# Patient Record
Sex: Male | Born: 1945 | Race: Black or African American | Hispanic: No | Marital: Single | Smoking: Former smoker
Health system: Southern US, Community
[De-identification: ages and names within clinical notes are randomized; demographics above are authoritative.]

## PROBLEM LIST (undated history)

## (undated) DIAGNOSIS — F819 Developmental disorder of scholastic skills, unspecified: Secondary | ICD-10-CM

## (undated) DIAGNOSIS — G629 Polyneuropathy, unspecified: Secondary | ICD-10-CM

## (undated) DIAGNOSIS — C100 Malignant neoplasm of vallecula: Secondary | ICD-10-CM

## (undated) DIAGNOSIS — R634 Abnormal weight loss: Secondary | ICD-10-CM

## (undated) DIAGNOSIS — Z9221 Personal history of antineoplastic chemotherapy: Secondary | ICD-10-CM

## (undated) DIAGNOSIS — R05 Cough: Secondary | ICD-10-CM

## (undated) DIAGNOSIS — E871 Hypo-osmolality and hyponatremia: Secondary | ICD-10-CM

## (undated) DIAGNOSIS — A150 Tuberculosis of lung: Secondary | ICD-10-CM

## (undated) DIAGNOSIS — R112 Nausea with vomiting, unspecified: Secondary | ICD-10-CM

## (undated) DIAGNOSIS — R1310 Dysphagia, unspecified: Secondary | ICD-10-CM

## (undated) DIAGNOSIS — R300 Dysuria: Secondary | ICD-10-CM

## (undated) DIAGNOSIS — E079 Disorder of thyroid, unspecified: Secondary | ICD-10-CM

## (undated) DIAGNOSIS — R0602 Shortness of breath: Secondary | ICD-10-CM

## (undated) DIAGNOSIS — M1990 Unspecified osteoarthritis, unspecified site: Secondary | ICD-10-CM

## (undated) DIAGNOSIS — H9209 Otalgia, unspecified ear: Secondary | ICD-10-CM

## (undated) DIAGNOSIS — G620 Drug-induced polyneuropathy: Secondary | ICD-10-CM

## (undated) DIAGNOSIS — C799 Secondary malignant neoplasm of unspecified site: Secondary | ICD-10-CM

## (undated) DIAGNOSIS — Z923 Personal history of irradiation: Secondary | ICD-10-CM

## (undated) DIAGNOSIS — C801 Malignant (primary) neoplasm, unspecified: Secondary | ICD-10-CM

## (undated) DIAGNOSIS — T451X5A Adverse effect of antineoplastic and immunosuppressive drugs, initial encounter: Secondary | ICD-10-CM

## (undated) DIAGNOSIS — R059 Cough, unspecified: Secondary | ICD-10-CM

## (undated) HISTORY — DX: Abnormal weight loss: R63.4

## (undated) HISTORY — DX: Disorder of thyroid, unspecified: E07.9

## (undated) HISTORY — DX: Polyneuropathy, unspecified: G62.9

## (undated) HISTORY — DX: Nausea with vomiting, unspecified: R11.2

## (undated) HISTORY — DX: Secondary malignant neoplasm of unspecified site: C79.9

## (undated) HISTORY — DX: Dysuria: R30.0

## (undated) HISTORY — DX: Hypo-osmolality and hyponatremia: E87.1

---

## 1996-06-09 DIAGNOSIS — A150 Tuberculosis of lung: Secondary | ICD-10-CM

## 1996-06-09 HISTORY — DX: Tuberculosis of lung: A15.0

## 1999-01-23 ENCOUNTER — Encounter: Payer: Self-pay | Admitting: Cardiology

## 1999-01-23 ENCOUNTER — Ambulatory Visit (HOSPITAL_COMMUNITY): Admission: RE | Admit: 1999-01-23 | Discharge: 1999-01-23 | Payer: Self-pay | Admitting: Cardiology

## 2004-06-09 HISTORY — PX: COLONOSCOPY: SHX174

## 2004-10-23 ENCOUNTER — Encounter: Admission: RE | Admit: 2004-10-23 | Discharge: 2004-10-23 | Payer: Self-pay | Admitting: Internal Medicine

## 2005-09-17 ENCOUNTER — Encounter: Admission: RE | Admit: 2005-09-17 | Discharge: 2005-09-17 | Payer: Self-pay | Admitting: Internal Medicine

## 2009-05-15 ENCOUNTER — Encounter: Admission: RE | Admit: 2009-05-15 | Discharge: 2009-05-15 | Payer: Self-pay | Admitting: Internal Medicine

## 2010-06-30 ENCOUNTER — Encounter: Payer: Self-pay | Admitting: Internal Medicine

## 2010-08-07 ENCOUNTER — Other Ambulatory Visit: Payer: Self-pay | Admitting: Internal Medicine

## 2010-08-07 ENCOUNTER — Ambulatory Visit
Admission: RE | Admit: 2010-08-07 | Discharge: 2010-08-07 | Disposition: A | Discharge status: Home / Self Care | Payer: Medicare Other | Source: Ambulatory Visit | Attending: Internal Medicine | Admitting: Internal Medicine

## 2010-08-07 DIAGNOSIS — R7611 Nonspecific reaction to tuberculin skin test without active tuberculosis: Secondary | ICD-10-CM

## 2011-07-09 HISTORY — PX: DIRECT LARYNGOSCOPY: SHX5326

## 2012-07-03 ENCOUNTER — Emergency Department (INDEPENDENT_AMBULATORY_CARE_PROVIDER_SITE_OTHER)
Admission: EM | Admit: 2012-07-03 | Discharge: 2012-07-03 | Disposition: A | Discharge status: Nursing Home (Includes ICF) | Payer: Medicare Other | Source: Home / Self Care | Attending: Family Medicine | Admitting: Family Medicine

## 2012-07-03 ENCOUNTER — Emergency Department (HOSPITAL_COMMUNITY)
Admission: EM | Admit: 2012-07-03 | Discharge: 2012-07-03 | Disposition: A | Discharge status: Home / Self Care | Payer: Medicare Other | Attending: Emergency Medicine | Admitting: Emergency Medicine

## 2012-07-03 ENCOUNTER — Encounter (HOSPITAL_COMMUNITY): Payer: Self-pay | Admitting: *Deleted

## 2012-07-03 ENCOUNTER — Emergency Department (HOSPITAL_COMMUNITY): Payer: Medicare Other

## 2012-07-03 ENCOUNTER — Encounter (HOSPITAL_COMMUNITY): Payer: Self-pay | Admitting: Physical Medicine and Rehabilitation

## 2012-07-03 DIAGNOSIS — R634 Abnormal weight loss: Secondary | ICD-10-CM

## 2012-07-03 DIAGNOSIS — R1310 Dysphagia, unspecified: Secondary | ICD-10-CM | POA: Insufficient documentation

## 2012-07-03 DIAGNOSIS — F17200 Nicotine dependence, unspecified, uncomplicated: Secondary | ICD-10-CM | POA: Insufficient documentation

## 2012-07-03 DIAGNOSIS — Z8611 Personal history of tuberculosis: Secondary | ICD-10-CM | POA: Insufficient documentation

## 2012-07-03 DIAGNOSIS — J3489 Other specified disorders of nose and nasal sinuses: Secondary | ICD-10-CM | POA: Insufficient documentation

## 2012-07-03 DIAGNOSIS — R1110 Vomiting, unspecified: Secondary | ICD-10-CM

## 2012-07-03 DIAGNOSIS — J029 Acute pharyngitis, unspecified: Secondary | ICD-10-CM | POA: Insufficient documentation

## 2012-07-03 DIAGNOSIS — C760 Malignant neoplasm of head, face and neck: Secondary | ICD-10-CM | POA: Insufficient documentation

## 2012-07-03 DIAGNOSIS — R040 Epistaxis: Secondary | ICD-10-CM | POA: Insufficient documentation

## 2012-07-03 HISTORY — DX: Tuberculosis of lung: A15.0

## 2012-07-03 LAB — BASIC METABOLIC PANEL
CO2: 28 mEq/L (ref 19–32)
Calcium: 9.1 mg/dL (ref 8.4–10.5)
Chloride: 99 mEq/L (ref 96–112)
Potassium: 3.7 mEq/L (ref 3.5–5.1)
Sodium: 135 mEq/L (ref 135–145)

## 2012-07-03 LAB — CBC WITH DIFFERENTIAL/PLATELET
Basophils Absolute: 0 10*3/uL (ref 0.0–0.1)
Lymphocytes Relative: 26 % (ref 12–46)
Neutro Abs: 2.9 10*3/uL (ref 1.7–7.7)
Neutrophils Relative %: 59 % (ref 43–77)
Platelets: 172 10*3/uL (ref 150–400)
RDW: 14.1 % (ref 11.5–15.5)
WBC: 5 10*3/uL (ref 4.0–10.5)

## 2012-07-03 MED ORDER — IOHEXOL 300 MG/ML  SOLN
75.0000 mL | Freq: Once | INTRAMUSCULAR | Status: AC | PRN
  Administered 2012-07-03: 75 mL via INTRAVENOUS

## 2012-07-03 NOTE — ED Notes (Signed)
Report called to Jennifer, ED Nurse First. 

## 2012-07-03 NOTE — ED Notes (Signed)
Sore throat, right earache, runny nose, productive cough x 3 wks.  Family member reports 15 lb weight loss over past 3 months.  Denies fevers.

## 2012-07-03 NOTE — ED Provider Notes (Signed)
History     CSN: [id number]  Arrival date & time 07/03/12  1611   First MD Initiated Contact with Patient 07/03/12 1702      No chief complaint on file.   (Consider location/radiation/quality/duration/timing/severity/associated sxs/prior treatment) Patient is a 67 y.o. male presenting with cough. The history is provided by the patient. No language interpreter was used.  Cough This is a new problem. The problem occurs constantly. The problem has been gradually worsening. The cough is productive of sputum. There has been no fever. Associated symptoms include sore throat. He is a smoker.  Pt not able to keep food down,  Pain in throat,  Mass in neck,  Cough and trouble breathing.  Pt has had 15 pd weight loss in 2 months  No past medical history on file.  No past surgical history on file.  No family history on file.  History  Substance Use Topics  . Smoking status: Not on file  . Smokeless tobacco: Not on file  . Alcohol Use: Not on file      Review of Systems  HENT: Positive for sore throat.   Respiratory: Positive for cough.   All other systems reviewed and are negative.    Allergies  Review of patient's allergies indicates not on file.  Home Medications  No current outpatient prescriptions on file.  BP 141/69  Pulse 63  Temp 97.6 F (36.4 C) (Oral)  Resp 16  SpO2 100%  Physical Exam  Nursing note and vitals reviewed. Constitutional: He appears well-developed and well-nourished.  HENT:  Head: Normocephalic and atraumatic.       erythema  Eyes: Conjunctivae normal and EOM are normal. Pupils are equal, round, and reactive to light.  Neck:       2x3 cm mass right lateral neck  Cardiovascular: Normal rate.   Pulmonary/Chest:       Harsh rhochi   Musculoskeletal: Normal range of motion.  Neurological: He is alert.  Skin: Skin is warm.  Psychiatric: He has a normal mood and affect.    ED Course  Procedures (including critical care time)  Labs  Reviewed - No data to display No results found.   No diagnosis found.    MDM  I counseled pt and daughter.   Pt needs labs, chest xray and evaluation of neck mass due to difficulty eating, drinking and vomiting.          Lonia Skinner [place], [place] 07/03/12 [phone]

## 2012-07-03 NOTE — ED Notes (Signed)
Patient states he has had difficulty swallowing for the past few weeks. He has a mass to the right side of his neck. He has been vomiting after eating.

## 2012-07-03 NOTE — ED Provider Notes (Signed)
History     CSN: [id number]  Arrival date & time 07/03/12  7829   First MD Initiated Contact with Patient 07/03/12 1847      Chief Complaint  Patient presents with  . Neck Pain  . Sore Throat    (Consider location/radiation/quality/duration/timing/severity/associated sxs/prior treatment) HPI Comments: Pt comes in with cc of sore throat and neck pain. Pt has had this pain for 3 weeks now. States that the pain is worse with swallowing, and is located on the right side. He also has a sensation that the food is getting stuck. He has some URI like symptoms. Family and patient also endorse decreased appetite and weight loss of about 15 lbs. Pt is not a heavy smoker, and he has no medical problems at all, and no hx of cancers.  Patient is a 67 y.o. male presenting with neck pain and pharyngitis. The history is provided by the patient.  Neck Pain  Pertinent negatives include no chest pain and no headaches.  Sore Throat Pertinent negatives include no chest pain, no headaches and no shortness of breath.    Past Medical History  Diagnosis Date  . TB (pulmonary tuberculosis)     History reviewed. No pertinent past surgical history.  History reviewed. No pertinent family history.  History  Substance Use Topics  . Smoking status: Current Every Day Smoker -- 0.2 packs/day    Types: Cigarettes  . Smokeless tobacco: Not on file  . Alcohol Use: Yes     Comment: occasional      Review of Systems  Constitutional: Negative for fever, chills and activity change.  HENT: Positive for nosebleeds, congestion, trouble swallowing and neck pain.   Eyes: Negative for visual disturbance.  Respiratory: Negative for cough, chest tightness and shortness of breath.   Cardiovascular: Negative for chest pain.  Gastrointestinal: Negative for abdominal distention.  Genitourinary: Negative for dysuria, enuresis and difficulty urinating.  Musculoskeletal: Negative for arthralgias.  Neurological:  Negative for dizziness, light-headedness and headaches.  Hematological: Negative for adenopathy.  Psychiatric/Behavioral: Negative for confusion.    Allergies  Review of patient's allergies indicates no known allergies.  Home Medications   Current Outpatient Rx  Name  Route  Sig  Dispense  Refill  . NAPROXEN SODIUM 220 MG PO TABS   Oral   Take 440 mg by mouth daily as needed. For pain           BP 142/87  Pulse 68  Temp 97.6 F (36.4 C) (Oral)  Resp 16  SpO2 98%  Physical Exam  Nursing note and vitals reviewed. Constitutional: He is oriented to person, place, and time. He appears well-developed.  HENT:  Head: Normocephalic and atraumatic.       Right submandibular mass, hard, tender to palpation, appears fix  Eyes: Conjunctivae normal and EOM are normal. Pupils are equal, round, and reactive to light.  Neck: Normal range of motion. Neck supple. No tracheal deviation present.  Cardiovascular: Normal rate, regular rhythm and normal heart sounds.   Pulmonary/Chest: Effort normal and breath sounds normal. No respiratory distress. He has no wheezes.  Abdominal: Soft. Bowel sounds are normal. He exhibits no distension. There is no tenderness. There is no rebound and no guarding.  Lymphadenopathy:    He has cervical adenopathy.  Neurological: He is alert and oriented to person, place, and time.  Skin: Skin is warm.    ED Course  Procedures (including critical care time)  Labs Reviewed  CBC WITH DIFFERENTIAL - Abnormal; Notable for  the following:    RBC 4.00 (*)     HCT 37.0 (*)     Monocytes Relative 14 (*)     All other components within normal limits  BASIC METABOLIC PANEL - Abnormal; Notable for the following:    GFR calc non Af Amer 75 (*)     GFR calc Af Amer 87 (*)     All other components within normal limits   Ct Soft Tissue Neck W Contrast  07/03/2012  *RADIOLOGY REPORT*  Clinical Data: Dysphasia.  Right ear pain.  Productive cough.  15 pound weight loss.   History of TB.  CT NECK WITH CONTRAST  Technique:  Multidetector CT imaging of the neck was performed with intravenous contrast.  Contrast: 75mL OMNIPAQUE IOHEXOL 300 MG/ML  SOLN  Comparison: None.  Findings: Large irregular mass extending from the epiglottis/aryepiglottic fold region into the piriform sinus (slightly greater on the right) into the glottic region and involving the hypopharynx surrounding and partially eroding cricoid cartilage and possibly arytenoid cartilage.  This narrows the glottic and supraglottic region is highly suspicious for primary malignancy.  Metastatic necrotic adenopathy most prominent right level II region measuring up to 2.7 cm.  Smaller lymph nodes scattered throughout the neck bilaterally including low left level III 1.5 x 1.5 x 2.6 cm lymph node.  Bilateral carotid bifurcation calcifications and narrowing most notable involving the proximal aspect of the left internal carotid artery.  Ill-defined right apical parenchymal changes.  It is possible this represents scarring.  Given the patient's above described findings, chest CT to evaluate for pulmonary involvement may be considered at some point in the near future.  Visualized intracranial structures and orbital structures unremarkable.  Prominent caries.  Cervical spondylotic changes.  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  Please see above for further detail.  Critical Value/emergent results were called by telephone at the time of interpretation on 07/03/2012 at 9:46 p.m. to Dr. Rhunette Croft, who verbally acknowledged these results.   Original Report Authenticated By: Lacy Duverney, M.D.      No diagnosis found.    MDM  Pt comes in with cc of sore throat some dysphagia.  He has no hx of musculoskeletal disorder, no medical problems art all in fact, and this doesn't appear to be a stroke. We have suspicious  appearing mass and some unintentional weight loss - so we will get CT soft tissue neck. Other consideration includes diverticulum, fistulas.  10:45 PM Pt's CT is concerning for possible cancer. Family informed of the findings and that the suspicion for cancer is high. I have advised them to get Oncology follow up as soon as possible, and the contact info has been provided. Pt and family understands the plan.  Derwood Kaplan, MD 07/03/12 2246

## 2012-07-03 NOTE — ED Notes (Signed)
Pt presents to department from [facility] for evaluation of R sided neck pain. Ongoing x3 weeks. Pt states difficulty swallowing, cough and vomiting. Also reports 15lb weight loss in x2 months. 5/10 pain at the time. He is conscious alert and oriented x4. No signs of acute distress noted.

## 2012-07-03 NOTE — ED Notes (Signed)
Called CT, patient has IV access.

## 2012-07-05 ENCOUNTER — Telehealth: Payer: Self-pay | Admitting: Oncology

## 2012-07-05 NOTE — Telephone Encounter (Signed)
S/W pt dtr in re NP appt 1/29 @ 10:30 w/Dr. Gaylyn Rong Dx- Neck CA Referring- ED Referral.  Welcome packet @ registration.

## 2012-07-05 NOTE — Telephone Encounter (Signed)
C/D 07/05/12 for appt. 07/07/12

## 2012-07-05 NOTE — ED Provider Notes (Signed)
Medical screening examination/treatment/procedure(s) were performed by resident physician or non-physician practitioner and as supervising physician I was immediately available for consultation/collaboration.   Vici Novick DOUGLAS MD.    Monte Zinni D Vernie Piet, MD 07/05/12 1600 

## 2012-07-06 ENCOUNTER — Other Ambulatory Visit: Payer: Self-pay | Admitting: Oncology

## 2012-07-06 NOTE — Patient Instructions (Addendum)
1.  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 2.  Stage: IVB (need to rule out stage IVC when this cancer is no longer curable if it has distant spread). 3.  Assume stage IVB:  Recommendation would be upfront laryngectomy since upfront chemoradiation in this case has less then 30% chance of cure.  Then followed by chemoradiation afterward to decrease risk of recurrence.  If there is hypopharyngeal involvement, laryngectomy will require flap; this is done at [facility].  If there is no hypopharyngeal involvement, simple laryngectomy can be performed here at [facility] by local ENT physician.  4.  Work up:  *  Urgent evaluation by [facility] today.  Dr. Serena Colonel. [address].  [phone].   *  Referral to Radiation Oncology to see if they agree with upfront laryngectomy.  *  Once confirm cancer on biopsy, will obtain PET scan to rule out distant disease.  If distant disease, then no laryngectomy, just chemotherapy.

## 2012-07-07 ENCOUNTER — Encounter: Payer: Self-pay | Admitting: *Deleted

## 2012-07-07 ENCOUNTER — Ambulatory Visit (HOSPITAL_BASED_OUTPATIENT_CLINIC_OR_DEPARTMENT_OTHER): Payer: Medicare Other | Admitting: Oncology

## 2012-07-07 ENCOUNTER — Encounter (HOSPITAL_COMMUNITY): Payer: Self-pay

## 2012-07-07 ENCOUNTER — Encounter: Payer: Self-pay | Admitting: Oncology

## 2012-07-07 ENCOUNTER — Other Ambulatory Visit: Payer: Medicare Other | Admitting: Lab

## 2012-07-07 ENCOUNTER — Ambulatory Visit: Payer: Medicare Other

## 2012-07-07 VITALS — BP 119/75 | HR 91 | Temp 98.0°F | Resp 20 | Ht 68.0 in | Wt 120.0 lb

## 2012-07-07 DIAGNOSIS — J387 Other diseases of larynx: Secondary | ICD-10-CM

## 2012-07-07 NOTE — Progress Notes (Signed)
FMLA paperwork for daughter, Cody [name], signed by Dr. Gaylyn Rong and returned to [facility] in [facility] dept.

## 2012-07-07 NOTE — Progress Notes (Signed)
Put daughter's fmla form in registration desk. °

## 2012-07-07 NOTE — Progress Notes (Signed)
Put daughter's fmla form on nurse's desk. °

## 2012-07-07 NOTE — Progress Notes (Signed)
Checking new patient. The daughter/son will check on insurance. I advised the [employer] complete is in the system, but he was trying to give me a [employer] card(old). I advised to make sure we have the correct insurance to be billed. The patient can't remember.

## 2012-07-07 NOTE — Progress Notes (Signed)
FMLA paperwork from Daughter, Rebeca Alert,  Forwarded to [facility] in managed care dept.Marland Kitchen

## 2012-07-07 NOTE — Progress Notes (Signed)
Spoke with Dr. Lucky Rathke office to request that orders be put in for patient. They will be entered by Dr. Pollyann Kennedy.

## 2012-07-07 NOTE — H&P (Signed)
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 Discussed  Patient has a laryngeal mass with cervical adenopathy, this is likely malignant. Recommend direct microlaryngoscopy with biopsy and esophagoscopy at [facility]. This was scheduled for tomorrow morning. Depending on extent of disease, patient may require referral to an academic center. Advised that patient stay free of nicotine and alcohol. We will see him tomorrow morning.  Patient agrees with the plan. Plan  I have interviewed and examined the patient and developed the proposed treatment plan.  Meilah Delrosario H. Pollyann Kennedy, M.D. Reason For Visit  Cody [name] is here today at the kind request of Jethro Bolus for consultation and opinion for throat evaluation. HPI  Patient is a 67 year old male who presents for odynophagia and dysphagia for four months. This is a new problem. He states that liquids and saliva burn when going down. Solid foods hurt and there is referred right otalgia. He has noticed a knot on the right side of neck and lost about 15 pounds unintentionally. CT of neck performed 07/03/12; per report, primary malignancy suspected in the supraglottic, glottic and hyppharyngeal region with necrotic bilateral adenopathy larger on the right (right Level II region measuring up to 2.7cm). Smaller lymph nodes scattered throughout the neck bilaterally including left level II 1.5x1.5x2.6cm node. Quit smoking four days ago; smoked 1 pack per week for 20 years. Also quit alcohol a few days ago. No PMH of cardiopulmonary disease. Allergies  No Known Drug Allergies. Current Meds  No Reported Medications;; RPT. PMH  Personal history of tuberculosis (V12.01) (Z86.11); 16+ yrs ago. Family Hx  Family history of pancreatic cancer: Brother (V16.0) (Z80.0) Family history of rheumatoid arthritis: Father (V17.7) (Z82.61) Family history of seizures: Sister (V19.8) (Z84.89) Family history of vascular disorder: Mother (V17.49) (Z82.49); leg  amputation. Personal Hx  Alcohol use; 2 drinks day or fewer No caffeine use Smoker 2014 (305.1) (F17.200); quit 07-02-2012. ROS  Systemic: Not feeling tired (fatigue).  No fever  and no night sweats.  Recent weight loss. Head: Headache. Eyes: No eye symptoms. Otolaryngeal: No hearing loss.  Earache.  No tinnitus  and no purulent nasal discharge.  No nasal passage blockage (stuffiness), no snoring, and no sneezing.  Hoarseness  and sore throat. Cardiovascular: No chest pain or discomfort  and no palpitations. Pulmonary: No dyspnea.  Cough.  No wheezing. Gastrointestinal: Dysphagia.  No heartburn.  No nausea, no abdominal pain, and no melena.  No diarrhea. Genitourinary: No dysuria. Endocrine: No muscle weakness. Musculoskeletal: No calf muscle cramps, no arthralgias, and no soft tissue swelling. Neurological: No dizziness, no fainting, no tingling, and no numbness. Psychological: No anxiety  and no depression. Skin: No rash. 12 system ROS was obtained and reviewed on the Health Maintenance form dated today.  Positive responses are shown above.  If the symptom is not checked, the patient has denied it. Vital Signs   Recorded by Skolimowski,Sharon on 07 Jul 2012 01:36 PM BP:124/70,  Height: 69 in, Weight: 120 lb, BMI: 17.7 kg/m2,  BMI Calculated: 17.72 ,  BSA Calculated: 1.66. Physical Exam  APPEARANCE: Well developed, thin, in no acute distress.  Normal affect, in a pleasant mood.  Oriented to time, place and person. COMMUNICATION: Normal voice   HEAD & FACE:  No scars, lesions or masses of head and face.  Sinuses nontender to palpation.  Salivary glands without mass or tenderness.  Facial strength symmetric.   EYES: EOMI with normal primary gaze alignment. Visual acuity grossly intact.  PERRLA EXTERNAL EAR & NOSE:  No scars, lesions or masses  EAC & TYMPANIC MEMBRANE:  EAC shows no obstructing lesions or debris and tympanic membranes are normal bilaterally. GROSS HEARING: Normal    TMJ:  Nontender  INTRANASAL EXAM: No polyps or purulence. Leftward septal deviation. NASOPHARYNX: Normal, without lesions. LIPS, TEETH & GUMS: No lip lesions, normal dentition and normal gums. ORAL CAVITY/OROPHARYNX:  Oral mucosa moist without lesion or asymmetry of the palate, tongue, tonsil or posterior pharynx. NECK:  2x2cm firm round mass, right Level II. THYROID:  Normal with no masses palpable.  NEUROLOGIC:  No gross CN deficits. No nystagmus noted.   LYMPHATIC:  No enlarged nodes palpable.   LARYNX (mirror exam): Could not visualize due to a strong gag reflex and excessive pooling of secretions. Results  CT scan reviewed, there is a large mass involving the supraglottic larynx and possibly the hypopharynx on the right side. There is also bilateral lymphadenopathy. Procedure  Fiberoptic Laryngoscopy Name: Joelle Flessner     Age: 89 year   Performing Provider: Aquilla Hacker. Dr. Pollyann Kennedy present for exam.  The risks of the procedure are minimal and were discussed with the patient today. Pre-op Diagnosis: odynophagia  Post-op Diagnosis: TVC lesion Allergy:  reviewed allergies as listed Nasal Prep:Lidocaine/Afrin   Procedure:     With the patient seated in the exam chair, the R nasal cavity was intubated with the flexible laryngoscope.  The nasal cavity mucosa, nasopharynx, hypopharynx and larynx were all examined with findings as noted below.  The scope was then removed.  The patient tolerated the procedure well without complication or blood loss.   FINDINGS: Nasal mucosa: normal Nasopharynx: normal, septum deviated to the left. Hypopharynx: normal Larynx: excessive pooling of secretions unable to be adequately cleared. Larynx is irritated, there may be a mass on the right cord, briefly visualized.  Passenger transport manager  Electronically signed by : Aquilla Hacker  PA-C; 07/07/2012 2:26 PM EST. Electronically signed by : Serena Colonel  M.D.; 07/07/2012 2:30 PM EST.

## 2012-07-07 NOTE — Progress Notes (Signed)
Sore throat, odynophagia. 4 wks; getting worse;  Hurt in right side.   Weight loss; 20 lb over 4 wks.   Frontal headache; right ear pain     [facility]  Telephone:([phone]) [phone] [phone]   MEDICAL ONCOLOGY - INITIAL CONSULATION    Referral MD:  Dr. Dorothyann Peng, M.D.  Reason for Referral:  Suspecting head/neck cancer.   HPI:  Cody [name] is a 67 year-old man with history of smoking.  For the past month, he has been having progressive sore throat.  He presented to ED on 07/03/2012 with CT scan showing epiglottic mass extending to the hypopharynx and right neck node.  He was kindly referred to the [facility] for evaluation.   [name] presented to the Clinic for the first time today with his son and daughter.  He reported sore throat, odynophagia and solid food dysphagia in the right side of this throat.  He also noticed progressive right neck node the last few weeks.  The neck node does not hurt.  He also has about 20-lb weight loss over the past few weeks due to the aforementioned symptoms.  He has mild headache and right ear pain without right ear discharge or fever.  He denied palpable adenopathy elsewhere.  He has mild fatigue; however, he is still independent of all activities of daily living.   Patient denies fever, visual changes, confusion, drenching night sweats, mucositis, nausea vomiting, jaundice, chest pain, palpitation, shortness of breath, dyspnea on exertion, productive cough, gum bleeding, epistaxis, hematemesis, hemoptysis, abdominal pain, abdominal swelling, early satiety, melena, hematochezia, hematuria, skin rash, spontaneous bleeding, joint swelling, joint pain, heat or cold intolerance, bowel bladder incontinence, back pain, focal motor weakness, paresthesia, depression.      Past Medical History  Diagnosis Date  . TB (pulmonary tuberculosis) 1998  :  Past Surgical History  Procedure Date  . Colonoscopy 2006    neg.    :  No current outpatient prescriptions on file.     No Known Allergies:  Family History  Problem Relation Age of Onset  . Vascular Disease Mother   . Cancer Father     unk cancer  . Cancer Brother     prostate cancer  :  History   Social History  . Marital Status: Single    Spouse Name: N/A    Number of Children: 2  . Years of Education: N/A   Occupational History  .      [employer]   Social History Main Topics  . Smoking status: Former Smoker -- 0.2 packs/day for 40 years    Types: Cigarettes    Quit date: 06/09/2012  . Smokeless tobacco: Never Used  . Alcohol Use: 1.2 oz/week    2 Glasses of wine per week     Comment: occasional  . Drug Use: No  . Sexually Active:    Other Topics Concern  . Not on file   Social History Narrative  . No narrative on file  :  Pertinent items are noted in HPI.  Exam: ECOG 1  General: thin-appearing man, in no acute distress.  Eyes:  no scleral icterus.  ENT:  There were no oral or base of tongue mass.  I could not visualize oropharynx/larynx/hypopharynx on my exam.  Neck was without thyromegaly.  Lymphatics:  Negative for supraclavicular or axillary adenopathy. There was a 3cm left cervical level II mass, mobile, non tender.   Respiratory: lungs were clear bilaterally without wheezing or  crackles.  Cardiovascular:  Regular rate and rhythm, S1/S2, without murmur, rub or gallop.  There was no pedal edema.  GI:  abdomen was soft, flat, nontender, nondistended, without organomegaly.  Muscoloskeletal:  no spinal tenderness of palpation of vertebral spine.  Skin exam was without echymosis, petichae.  Neuro exam was nonfocal.  Patient was able to get on and off exam table without assistance.  Gait was normal.  Patient was alerted and oriented.  Attention was good.   Language was appropriate.  Mood was normal without depression.  Speech was not pressured.  Thought content was not tangential.     Lab Results  Component Value Date    WBC 5.0 07/03/2012   HGB 13.0 07/03/2012   HCT 37.0* 07/03/2012   PLT 172 07/03/2012   GLUCOSE 91 07/03/2012   NA 135 07/03/2012   K 3.7 07/03/2012   CL 99 07/03/2012   CREATININE 1.02 07/03/2012   BUN 11 07/03/2012   CO2 28 07/03/2012   IMAGING:  I personally reviewed the following CT scan and showed to the patient and his relatives the images.   Ct Soft Tissue Neck W Contrast  07/03/2012  *RADIOLOGY REPORT*  Clinical Data: Dysphasia.  Right ear pain.  Productive cough.  15 pound weight loss.  History of TB.  CT NECK WITH CONTRAST  Technique:  Multidetector CT imaging of the neck was performed with intravenous contrast.  Contrast: 75mL OMNIPAQUE IOHEXOL 300 MG/ML  SOLN  Comparison: None.  Findings: Large irregular mass extending from the epiglottis/aryepiglottic fold region into the piriform sinus (slightly greater on the right) into the glottic region and involving the hypopharynx surrounding and partially eroding cricoid cartilage and possibly arytenoid cartilage.  This narrows the glottic and supraglottic region is highly suspicious for primary malignancy.  Metastatic necrotic adenopathy most prominent right level II region measuring up to 2.7 cm.  Smaller lymph nodes scattered throughout the neck bilaterally including low left level III 1.5 x 1.5 x 2.6 cm lymph node.  Bilateral carotid bifurcation calcifications and narrowing most notable involving the proximal aspect of the left internal carotid artery.  Ill-defined right apical parenchymal changes.  It is possible this represents scarring.  Given the patient's above described findings, chest CT to evaluate for pulmonary involvement may be considered at some point in the near future.  Visualized intracranial structures and orbital structures unremarkable.  Prominent caries.  Cervical spondylotic changes.  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   Please see above for further detail.  Critical Value/emergent results were called by telephone at the time of interpretation on 07/03/2012 at 9:46 p.m. to Dr. Rhunette Croft, who verbally acknowledged these results.   Original Report Authenticated By: Lacy Duverney, M.D.     Assessment and Plan:   1.  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 2.  Stage: IVA or IVB  (due to high concern for thyroid cartilage involvement on CT).  (need to rule out stage IVC when this cancer is no longer curable if it has distant spread). 3.  Assume stage IVA or IVB:  Recommendation would be upfront laryngectomy since upfront chemoradiation in this case has less then 30% chance of cure.  Then followed by chemoradiation afterward to decrease risk of recurrence.  If there is hypopharyngeal involvement, laryngectomy will require flap reconstruction; this is done at [facility].  If there is no hypopharyngeal involvement, simple laryngectomy can be performed here at [facility] by  local ENT physician.  4.  Work up:  *  Urgent evaluation by [facility] today.  Dr. Serena Colonel had graciously agreed to see patient today.   *  Referral to Radiation Oncology to see if they agree with upfront laryngectomy.  *  Once confirm cancer on biopsy, will obtain PET scan to rule out distant disease.  If distant disease, then no laryngectomy, just chemotherapy.    The length of time of the face-to-face encounter was 45 minutes. More than 50% of time was spent counseling and coordination of care.     Thank you for this referral.

## 2012-07-08 ENCOUNTER — Encounter (HOSPITAL_COMMUNITY): Payer: Self-pay | Admitting: Certified Registered Nurse Anesthetist

## 2012-07-08 ENCOUNTER — Ambulatory Visit (HOSPITAL_COMMUNITY): Payer: Medicare Other

## 2012-07-08 ENCOUNTER — Encounter (HOSPITAL_COMMUNITY)
Admission: RE | Disposition: A | Discharge status: Home / Self Care | Payer: Self-pay | Source: Ambulatory Visit | Attending: Otolaryngology

## 2012-07-08 ENCOUNTER — Ambulatory Visit (HOSPITAL_COMMUNITY)
Admission: RE | Admit: 2012-07-08 | Discharge: 2012-07-08 | Disposition: A | Discharge status: Home / Self Care | Payer: Medicare Other | Source: Ambulatory Visit | Attending: Otolaryngology | Admitting: Otolaryngology

## 2012-07-08 ENCOUNTER — Encounter (HOSPITAL_COMMUNITY): Payer: Self-pay | Admitting: *Deleted

## 2012-07-08 ENCOUNTER — Encounter: Payer: Self-pay | Admitting: Radiation Oncology

## 2012-07-08 ENCOUNTER — Ambulatory Visit (HOSPITAL_COMMUNITY): Payer: Medicare Other | Admitting: Certified Registered Nurse Anesthetist

## 2012-07-08 DIAGNOSIS — K209 Esophagitis, unspecified without bleeding: Secondary | ICD-10-CM | POA: Insufficient documentation

## 2012-07-08 DIAGNOSIS — Z87891 Personal history of nicotine dependence: Secondary | ICD-10-CM | POA: Insufficient documentation

## 2012-07-08 DIAGNOSIS — C329 Malignant neoplasm of larynx, unspecified: Secondary | ICD-10-CM | POA: Insufficient documentation

## 2012-07-08 DIAGNOSIS — J387 Other diseases of larynx: Secondary | ICD-10-CM

## 2012-07-08 HISTORY — DX: Cough: R05

## 2012-07-08 HISTORY — DX: Cough, unspecified: R05.9

## 2012-07-08 HISTORY — PX: ESOPHAGOSCOPY: SHX5534

## 2012-07-08 HISTORY — PX: DIRECT LARYNGOSCOPY: SHX5326

## 2012-07-08 HISTORY — DX: Developmental disorder of scholastic skills, unspecified: F81.9

## 2012-07-08 SURGERY — LARYNGOSCOPY, DIRECT
Anesthesia: General | Site: Throat | Wound class: Clean Contaminated

## 2012-07-08 MED ORDER — ONDANSETRON HCL 4 MG/2ML IJ SOLN
INTRAMUSCULAR | Status: DC | PRN
  Administered 2012-07-08: 4 mg via INTRAVENOUS

## 2012-07-08 MED ORDER — LIDOCAINE-EPINEPHRINE 1 %-1:100000 IJ SOLN
INTRAMUSCULAR | Status: AC
  Filled 2012-07-08: qty 1

## 2012-07-08 MED ORDER — LACTATED RINGERS IV SOLN: INTRAVENOUS | Status: DC | PRN

## 2012-07-08 MED ORDER — SUCCINYLCHOLINE CHLORIDE 20 MG/ML IJ SOLN
INTRAMUSCULAR | Status: DC | PRN
  Administered 2012-07-08: 100 mg via INTRAVENOUS

## 2012-07-08 MED ORDER — EPINEPHRINE HCL (NASAL) 0.1 % NA SOLN
NASAL | Status: DC | PRN
  Administered 2012-07-08: 10 mL via TOPICAL

## 2012-07-08 MED ORDER — LIDOCAINE HCL 4 % MT SOLN
OROMUCOSAL | Status: DC | PRN
  Administered 2012-07-08: 4 mL via TOPICAL

## 2012-07-08 MED ORDER — PROPOFOL 10 MG/ML IV BOLUS
INTRAVENOUS | Status: DC | PRN
  Administered 2012-07-08: 200 mg via INTRAVENOUS
  Administered 2012-07-08: 50 mg via INTRAVENOUS

## 2012-07-08 MED ORDER — LACTATED RINGERS IV SOLN
INTRAVENOUS | Status: DC | PRN
  Administered 2012-07-08: 10:00:00 via INTRAVENOUS

## 2012-07-08 MED ORDER — ONDANSETRON HCL 4 MG/2ML IJ SOLN
4.0000 mg | Freq: Once | INTRAMUSCULAR | Status: DC | PRN
Start: 2012-07-08 — End: 2012-07-08

## 2012-07-08 MED ORDER — HYDROMORPHONE HCL PF 1 MG/ML IJ SOLN: 0.2500 mg | INTRAMUSCULAR | Status: DC | PRN

## 2012-07-08 MED ORDER — 0.9 % SODIUM CHLORIDE (POUR BTL) OPTIME
TOPICAL | Status: DC | PRN
  Administered 2012-07-08: 1000 mL

## 2012-07-08 MED ORDER — LIDOCAINE HCL (CARDIAC) 20 MG/ML IV SOLN
INTRAVENOUS | Status: DC | PRN
  Administered 2012-07-08: 100 mg via INTRAVENOUS

## 2012-07-08 MED ORDER — LACTATED RINGERS IV SOLN
INTRAVENOUS | Status: DC
  Administered 2012-07-08: 10:00:00 via INTRAVENOUS

## 2012-07-08 MED ORDER — EPINEPHRINE HCL (NASAL) 0.1 % NA SOLN
NASAL | Status: AC
  Filled 2012-07-08: qty 30

## 2012-07-08 MED ORDER — FENTANYL CITRATE 0.05 MG/ML IJ SOLN
INTRAMUSCULAR | Status: DC | PRN
  Administered 2012-07-08: 50 ug via INTRAVENOUS

## 2012-07-08 SURGICAL SUPPLY — 17 items
CANISTER SUCTION 2500CC (MISCELLANEOUS) ×2 IMPLANT
CLOTH BEACON ORANGE TIMEOUT ST (SAFETY) ×2 IMPLANT
COVER MAYO STAND STRL (DRAPES) ×1 IMPLANT
COVER TABLE BACK 60X90 (DRAPES) ×2 IMPLANT
DRAPE PROXIMA HALF (DRAPES) ×2 IMPLANT
GLOVE ECLIPSE 7.5 STRL STRAW (GLOVE) ×2 IMPLANT
GUARD TEETH (MISCELLANEOUS) ×1 IMPLANT
KIT BASIN OR (CUSTOM PROCEDURE TRAY) ×2 IMPLANT
KIT ROOM TURNOVER OR (KITS) ×2 IMPLANT
NS IRRIG 1000ML POUR BTL (IV SOLUTION) ×2 IMPLANT
PAD ARMBOARD 7.5X6 YLW CONV (MISCELLANEOUS) ×4 IMPLANT
PATTIES SURGICAL .5 X3 (DISPOSABLE) ×1 IMPLANT
SPECIMEN JAR SMALL (MISCELLANEOUS) ×1 IMPLANT
SPONGE GAUZE 4X4 12PLY (GAUZE/BANDAGES/DRESSINGS) ×2 IMPLANT
TOWEL OR 17X24 6PK STRL BLUE (TOWEL DISPOSABLE) ×2 IMPLANT
TUBE CONNECTING 12X1/4 (SUCTIONS) ×2 IMPLANT
WATER STERILE IRR 1000ML POUR (IV SOLUTION) ×2 IMPLANT

## 2012-07-08 NOTE — Anesthesia Preprocedure Evaluation (Signed)
Anesthesia Evaluation  Patient identified by MRN, date of birth, ID band Patient awake    Reviewed: Allergy & Precautions, H&P , NPO status , Patient's Chart, lab work & pertinent test results  Airway Mallampati: I TM Distance: >3 FB Neck ROM: full    Dental   Pulmonary former smoker,          Cardiovascular Rhythm:regular Rate:Normal     Neuro/Psych    GI/Hepatic   Endo/Other    Renal/GU      Musculoskeletal   Abdominal   Peds  Hematology   Anesthesia Other Findings Hx TB 1998  Reproductive/Obstetrics                           Anesthesia Physical Anesthesia Plan  ASA: II  Anesthesia Plan: General   Post-op Pain Management:    Induction: Intravenous  Airway Management Planned: Oral ETT  Additional Equipment:   Intra-op Plan:   Post-operative Plan: Extubation in OR  Informed Consent: I have reviewed the patients History and Physical, chart, labs and discussed the procedure including the risks, benefits and alternatives for the proposed anesthesia with the patient or authorized representative who has indicated his/her understanding and acceptance.     Plan Discussed with: CRNA, Anesthesiologist and Surgeon  Anesthesia Plan Comments:         Anesthesia Quick Evaluation

## 2012-07-08 NOTE — Interval H&P Note (Signed)
History and Physical Interval Note:  07/08/2012 9:09 AM  Cody [name]  has presented today for surgery, with the diagnosis of laryngeal carcinoma  The various methods of treatment have been discussed with the patient and family. After consideration of risks, benefits and other options for treatment, the patient has consented to  Procedure(s) (LRB) with comments: DIRECT LARYNGOSCOPY (Right) as a surgical intervention .  The patient's history has been reviewed, patient examined, no change in status, stable for surgery.  I have reviewed the patient's chart and labs.  Questions were answered to the patient's satisfaction.     [name]

## 2012-07-08 NOTE — OR Nursing (Signed)
CXR results are normal. Anesthesia aware.

## 2012-07-08 NOTE — Op Note (Signed)
OPERATIVE REPORT  DATE OF SURGERY: 07/08/2012  PATIENT:  Cody [name],  67 y.o. male  PRE-OPERATIVE DIAGNOSIS:  Laryngeal carcinoma  POST-OPERATIVE DIAGNOSIS:  Laryngeal carcinoma  PROCEDURE:  Procedure(s): DIRECT LARYNGOSCOPY ESOPHAGOSCOPY  SURGEON:  Susy Frizzle, MD  ASSISTANTS: XXX   ANESTHESIA:   General   EBL:  20 ml  DRAINS:   LOCAL MEDICATIONS USED:  None  SPECIMEN:  Supraglottic mass, right  COUNTS:  Correct  PROCEDURE DETAILS: The patient was taken to the operating room and placed on the operating table in the supine position. Following induction of general endotracheal anesthesia, the table was turned 90 and a head drape was applied. A maxillary tooth protector was used throughout the case. There is a gap anteriorly, bilateral medial incisors were missing, and the remaining dentition was in very poor repair and somewhat loose. At the end of the case, the right lateral incisor was also missing. We searched in the pharynx and suctioned and did not find it.  Esophagoscopy. A rigid cervical esophagoscope was entered into the oral cavity through the esophageal introitus and advanced into the cervical esophagus. There is diffuse esophagitis throughout the esophageal wall but no masses or ulcerations identified. Biopsies were not taken. The scope was slowly removed while suctioning secretions and inspecting circumferentially around the esophageal wall.  Direct laryngoscopy with biopsy. A Jako laryngoscope was used to evaluate the larynx and hypopharynx. The left perform sinus was clear. The right perform sinus was also clear laterally although the medial wall in the area epiglottic fold were completely replaced by tumor. The tumor extended into the right side of the epiglottis and the medial wall of the area epiglottic fold down into the supraglottic/ventricular fold area. He was very difficult to examine the cords themselves in the could not pass the scope into the subglottic  area. The post cricoid area was clear. There is diffuse edema of the arytenoid mucosa but no involvement of tumor was seen. Multiple biopsies were taken of the right supraglottic mass and sent for pathologic evaluation. Topical adrenaline was placed on the biopsy site for hemostasis. Blood and secretions were suctioned and the scope was removed. The patient was then awakened, extubated and transferred to recovery in stable condition    PATIENT DISPOSITION:  To PACU, stable

## 2012-07-08 NOTE — Preoperative (Signed)
Beta Blockers   Reason not to administer Beta Blockers:Not Applicable 

## 2012-07-08 NOTE — Transfer of Care (Signed)
Immediate Anesthesia Transfer of Care Note  Patient: Cody [name]  Procedure(s) Performed: Procedure(s) (LRB) with comments: DIRECT LARYNGOSCOPY (N/A) ESOPHAGOSCOPY (N/A)  Patient Location: PACU  Anesthesia Type:General  Level of Consciousness: awake, alert  and oriented  Airway & Oxygen Therapy: Patient Spontanous Breathing and Patient connected to face mask oxygen  Post-op Assessment: Report given to PACU RN, Post -op Vital signs reviewed and stable and Patient moving all extremities  Post vital signs: Reviewed and stable  Complications: No apparent anesthesia complications

## 2012-07-08 NOTE — Anesthesia Postprocedure Evaluation (Signed)
  Anesthesia Post-op Note  Patient: Cody [name]  Procedure(s) Performed: Procedure(s) (LRB) with comments: DIRECT LARYNGOSCOPY (N/A) ESOPHAGOSCOPY (N/A)  Patient Location: PACU  Anesthesia Type:General  Level of Consciousness: awake, alert , oriented and patient cooperative  Airway and Oxygen Therapy: Patient Spontanous Breathing  Post-op Pain: none  Post-op Assessment: Post-op Vital signs reviewed, Patient's Cardiovascular Status Stable, Respiratory Function Stable, Patent Airway, No signs of Nausea or vomiting and Pain level controlled  Post-op Vital Signs: stable  Complications: No apparent anesthesia complications

## 2012-07-09 ENCOUNTER — Encounter: Payer: Self-pay | Admitting: Radiation Oncology

## 2012-07-09 ENCOUNTER — Ambulatory Visit
Admission: RE | Admit: 2012-07-09 | Discharge: 2012-07-09 | Disposition: A | Discharge status: Home / Self Care | Payer: Medicare Other | Source: Ambulatory Visit | Attending: Radiation Oncology | Admitting: Radiation Oncology

## 2012-07-09 VITALS — BP 111/72 | HR 76 | Temp 97.7°F | Wt 119.7 lb

## 2012-07-09 DIAGNOSIS — Z8611 Personal history of tuberculosis: Secondary | ICD-10-CM | POA: Insufficient documentation

## 2012-07-09 DIAGNOSIS — C321 Malignant neoplasm of supraglottis: Secondary | ICD-10-CM

## 2012-07-09 DIAGNOSIS — Z79899 Other long term (current) drug therapy: Secondary | ICD-10-CM | POA: Insufficient documentation

## 2012-07-09 HISTORY — DX: Dysphagia, unspecified: R13.10

## 2012-07-09 HISTORY — DX: Malignant neoplasm of vallecula: C10.0

## 2012-07-09 HISTORY — DX: Otalgia, unspecified ear: H92.09

## 2012-07-09 HISTORY — DX: Abnormal weight loss: R63.4

## 2012-07-09 MED ORDER — LARYNGOSCOPY SOLUTION RAD-ONC
15.0000 mL | Freq: Once | TOPICAL | Status: AC
  Administered 2012-07-09: 15 mL via TOPICAL

## 2012-07-09 NOTE — Progress Notes (Signed)
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   HISTORY OF PRESENT ILLNESS::Cody [name] is a 67 y.o. male who presented with sore throat approximately 3 months ago - was Rx'd antibiotics for this will no improvement.  He also developed some hoarseness, and 15-20lb weight loss as well as dysphagia with liquids and solids. CT of neck on 07-03-12 was performed, revealing a large irregular mass extending from the  epiglottis/aryepiglottic fold region into the piriform sinus (slightly greater on the right) into the glottic region and involving the hypopharynx surrounding and partially eroding cricoid cartilage and possibly arytenoid cartilage. The mass narrowed the  glottic and supraglottic region  - highly suspicious for primary malignancy.  He also had metastatic necrotic adenopathy most prominent right level II region measuring up to 2.7 cm. Smaller lymph nodes scattered throughout  the neck bilaterally including low left level III 1.5 x 1.5 x 2.6 cm lymph node.  His lungs showed ill-defined right apical parenchymal changes.   The patient was seen yesterday by Dr. Pollyann Kennedy.  He underwent laryngoscopy under anesthesia, which per his note showed the right/left piriform sinuses were  clear although the medial wall in the ariepiglottic fold were completely replaced by tumor. The tumor extended into the right side of the epiglottis and the medial wall of the aryepiglottic fold down into the supraglottic/ventricular fold area. It was reportedly very difficult to examine the cords themselves in the could not pass the scope into the subglottic area. The post cricoid area was clear. There is diffuse edema of the  arytenoid mucosa but no involvement of tumor was seen. Multiple biopsies were taken of the right supraglottic mass and sent for pathologic evaluation.   Path results pending, and his PET scan is also pending.  He recently quit smoking.  He reports increased throat pain and productive phlegmy cough since biopsy.  He is taking Hycet.   PREVIOUS RADIATION THERAPY: No  PAST MEDICAL HISTORY:  has a past medical history of TB (pulmonary tuberculosis) (1998); Learning disabilities; Cough; Cancer of vallecula epiglottica; Difficulty swallowing; Pain on swallowing; Abnormal weight loss (documented 07/07/12); and Referred otalgia.    PAST SURGICAL HISTORY: Past Surgical History  Procedure Date  . Colonoscopy 2006    neg.   . Direct laryngoscopy 07/09/11    Epiglottic mass  . Direct laryngoscopy 07/08/2012    Procedure: DIRECT LARYNGOSCOPY;  Surgeon: Serena Colonel, MD;  Location: [facility] OR;  Service: ENT;  Laterality: N/A;  . Esophagoscopy 07/08/2012    Procedure: ESOPHAGOSCOPY;  Surgeon: Serena Colonel, MD;  Location: [facility] OR;  Service: ENT;  Laterality: N/A;    FAMILY HISTORY: family history includes Cancer in his father; Prostate cancer in his brother; and Vascular Disease in his mother.  SOCIAL HISTORY:  reports that he quit smoking 6 days ago. His smoking use included Cigarettes. He has a 10 pack-year smoking history. He has never used smokeless tobacco. He reports that he drinks about 1.2 ounces of alcohol per week. He reports that he does not use illicit drugs.  ALLERGIES: Review of patient's allergies indicates no known allergies.  MEDICATIONS:  Current Outpatient Prescriptions  Medication Sig Dispense Refill  . HYDROcodone-acetaminophen (HYCET)  7.5-325 mg/15 ml solution Take by mouth 4 (four) times daily as needed.        REVIEW OF SYSTEMS: Pertinent items are noted in HPI.   PHYSICAL EXAM:  weight is 119 lb 11.2 oz (54.296 kg). His temperature is 97.7 F (36.5 C). His blood pressure is 111/72  and his pulse is 76. His oxygen saturation is 100%.   General: Alert and oriented, in no acute distress HEENT: Head is normocephalic.  Extraocular movements are intact. Oropharynx is notable for poor dentition. No oropharyngeal lesions. Neck: + for a lobulated 3cm mass in the level 3  Right neck. No supraclavicular lymphadenopathy. Heart: Regular in rate and rhythm with no murmurs, rubs, or gallops. Chest: Coarse sounds bilaterally, esp in the LLL. Abdomen: Soft, nontender, nondistended, with no rigidity or guarding. Extremities: No cyanosis or edema. Lymphatics: as above Skin: No concerning lesions. Musculoskeletal: symmetric strength and muscle tone throughout. Neurologic: Cranial nerves II through XII are grossly intact. No obvious focalities. Speech is fluent. Coordination is intact. Psychiatric: Judgment and insight are intact. Affect is blunted    LABORATORY DATA:  Lab Results  Component Value Date   WBC 5.0 07/03/2012   HGB 13.0 07/03/2012   HCT 37.0* 07/03/2012   MCV 92.5 07/03/2012   PLT 172 07/03/2012   CMP     Component Value Date/Time   NA 135 07/03/2012 1937   K 3.7 07/03/2012 1937   CL 99 07/03/2012 1937   CO2 28 07/03/2012 1937   GLUCOSE 91 07/03/2012 1937   BUN 11 07/03/2012 1937   CREATININE 1.02 07/03/2012 1937   CALCIUM 9.1 07/03/2012 1937   GFRNONAA 75* 07/03/2012 1937   GFRAA 87* 07/03/2012 1937         RADIOGRAPHY: Dg Chest 2 View  07/08/2012  *RADIOLOGY REPORT*  Clinical Data: Preoperative evaluation.  History of coughing.  Ex- smoker.  History of previous TB.  CHEST - 2 VIEW  Comparison: 08/07/2010.  Findings: Cardiac silhouette is normal size and shape.  There is slight aortic ectasia.  Mediastinal and hilar contours appear stable.  No pulmonary infiltrates or masses are evident.  There is flattening of the diaphragm on lateral image consistent with mild hyperinflation configuration.  There is no evidence of active tuberculosis.  The minimal right apical fibrotic  change is stable. Osteophytes are present in the spine.  IMPRESSION: Mild hyperinflation configuration.  No acute or active superimposed process.  Stable chronic findings are described above.   Original Report Authenticated By: Onalee Hua Call    Ct Soft Tissue Neck W Contrast  07/03/2012  *RADIOLOGY REPORT*  Clinical Data: Dysphasia.  Right ear pain.  Productive cough.  15 pound weight loss.  History of TB.  CT NECK WITH CONTRAST  Technique:  Multidetector CT imaging of the neck was performed with intravenous contrast.  Contrast: 75mL OMNIPAQUE IOHEXOL 300 MG/ML  SOLN  Comparison: None.  Findings: Large irregular mass extending from the epiglottis/aryepiglottic fold region into the piriform sinus (slightly greater on the right) into the glottic region and involving the hypopharynx surrounding and partially eroding cricoid cartilage and possibly arytenoid cartilage.  This narrows the glottic and supraglottic region is highly suspicious for primary malignancy.  Metastatic necrotic adenopathy most prominent right level II region measuring up to 2.7 cm.  Smaller lymph nodes scattered throughout the neck bilaterally including low left level III 1.5 x 1.5 x 2.6 cm lymph node.  Bilateral carotid bifurcation calcifications and narrowing most notable involving the proximal aspect of the  left internal carotid artery.  Ill-defined right apical parenchymal changes.  It is possible this represents scarring.  Given the patient's above described findings, chest CT to evaluate for pulmonary involvement may be considered at some point in the near future.  Visualized intracranial structures and orbital structures unremarkable.  Prominent caries.  Cervical spondylotic changes.  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  Please see above for further detail.  Critical Value/emergent results were called by telephone at the time of  interpretation on 07/03/2012 at 9:46 p.m. to Dr. Rhunette Croft, who verbally acknowledged these results.   Original Report Authenticated By: Lacy Duverney, M.D.    Dg Chest Port 1 View  07/08/2012  *RADIOLOGY REPORT*  Clinical Data: Cough.  Possible foreign body aspiration.  PORTABLE CHEST - 1 VIEW  Comparison: 07/08/2012  Findings: Both lungs show symmetric aeration and are clear.  Mild right upper lobe scarring is stable.  No evidence of pleural effusion.  Heart size is normal.  No mass or lymphadenopathy identified.  No radiopaque foreign body identified.  No evidence of main mediastinum or pneumothorax.  IMPRESSION: No active disease.   Original Report Authenticated By: Myles Rosenthal, M.D.       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  Patient was parenthetically told by Dr. Pollyann Kennedy that he will need surgery first, if PET doesn't show M1 disease.  I have put the patient on our tumor board to finalize his staging and surgical disposition.   Plan is as below:  1) PET scan  pending for staging  2) Tumor board to finalize staging a surgical disposition.  3)  Will determine needs for referrals for dentistry, and/or interventional radiology for PEG tube placement based on disposition  4) will proceed with referrals to social work, nutrition, and swallowing therapy  5) If surgery is upfront-  I would like to start radiotherapy within 6 weeks of his surgery for best results, based on the medical literature (Ang et all, IJROBP 2001). Chemotherapy disposition depends on post-op path.    It was a pleasure meeting the patient today. We discussed the risks, benefits, and side effects of radiotherapy. We talked in detail about acute and late effects.  We will review these at his next appointment.  I spent 45 minutes with the patient face to face, over 50% of that time on counseling and coordination of  care. __________________________________________   Lonie Peak, MD

## 2012-07-09 NOTE — Progress Notes (Signed)
Mr. Cody [name] here today for assessment for newly diagnosed Epiglottic Cancer.  He states his pain is a level 4 on a scale of 0-10 since he has started on Hycet.  He has dysphagia and odonyphagia with referred otalgia in the right ear.   Drinking liquids mostly following  Biopsy, but had one helping of vegetable soup today.  Has lost ~ 20 lbs in the past 3 months.   Presently coughing up thick, clear mucus since biopsy.   Cody [name] states he is sleeping without any difiiculty.

## 2012-07-09 NOTE — Progress Notes (Signed)
Instill laryngoscope solution vis nares, bilaterally for  laryngoscope examine performed by Dr. Basilio Cairo, but she was unable to visualize the vocal cords.

## 2012-07-11 ENCOUNTER — Other Ambulatory Visit: Payer: Self-pay | Admitting: Radiation Oncology

## 2012-07-11 DIAGNOSIS — C321 Malignant neoplasm of supraglottis: Secondary | ICD-10-CM

## 2012-07-12 ENCOUNTER — Telehealth: Payer: Self-pay | Admitting: *Deleted

## 2012-07-12 NOTE — Telephone Encounter (Signed)
CALLED PATIENT TO INFORM OF APPTS., LVM FOR A RETURN CALL 

## 2012-07-13 NOTE — Progress Notes (Signed)
Received office notes from Dr. Beverlee Nims @ [facility]; forwarded to Dr. Gaylyn Rong

## 2012-07-13 NOTE — Addendum Note (Signed)
Encounter addended by: Delynn Flavin, RN on: 07/13/2012  5:12 PM<BR>     Documentation filed: Charges VN

## 2012-07-14 ENCOUNTER — Telehealth: Payer: Self-pay | Admitting: Oncology

## 2012-07-14 ENCOUNTER — Other Ambulatory Visit: Payer: Self-pay | Admitting: Oncology

## 2012-07-14 DIAGNOSIS — C321 Malignant neoplasm of supraglottis: Secondary | ICD-10-CM

## 2012-07-14 NOTE — Telephone Encounter (Signed)
s.w pt daughter and advised on March Appt....pt ok and aware

## 2012-07-19 ENCOUNTER — Ambulatory Visit: Payer: Medicare Other | Attending: Radiation Oncology

## 2012-07-19 ENCOUNTER — Ambulatory Visit: Payer: Medicare Other

## 2012-07-19 DIAGNOSIS — R1310 Dysphagia, unspecified: Secondary | ICD-10-CM | POA: Insufficient documentation

## 2012-07-19 DIAGNOSIS — R490 Dysphonia: Secondary | ICD-10-CM | POA: Insufficient documentation

## 2012-07-19 DIAGNOSIS — IMO0001 Reserved for inherently not codable concepts without codable children: Secondary | ICD-10-CM | POA: Insufficient documentation

## 2012-07-19 DIAGNOSIS — C321 Malignant neoplasm of supraglottis: Secondary | ICD-10-CM | POA: Insufficient documentation

## 2012-07-20 ENCOUNTER — Encounter (HOSPITAL_COMMUNITY)
Admission: RE | Admit: 2012-07-20 | Discharge: 2012-07-20 | Disposition: A | Discharge status: Home / Self Care | Payer: Medicare Other | Source: Ambulatory Visit | Attending: Oncology | Admitting: Oncology

## 2012-07-20 ENCOUNTER — Ambulatory Visit: Payer: Medicare Other | Admitting: Nutrition

## 2012-07-20 DIAGNOSIS — N281 Cyst of kidney, acquired: Secondary | ICD-10-CM | POA: Insufficient documentation

## 2012-07-20 DIAGNOSIS — C770 Secondary and unspecified malignant neoplasm of lymph nodes of head, face and neck: Secondary | ICD-10-CM | POA: Insufficient documentation

## 2012-07-20 DIAGNOSIS — C321 Malignant neoplasm of supraglottis: Secondary | ICD-10-CM | POA: Insufficient documentation

## 2012-07-20 DIAGNOSIS — L989 Disorder of the skin and subcutaneous tissue, unspecified: Secondary | ICD-10-CM | POA: Insufficient documentation

## 2012-07-20 DIAGNOSIS — J984 Other disorders of lung: Secondary | ICD-10-CM | POA: Insufficient documentation

## 2012-07-20 DIAGNOSIS — I2510 Atherosclerotic heart disease of native coronary artery without angina pectoris: Secondary | ICD-10-CM | POA: Insufficient documentation

## 2012-07-20 LAB — GLUCOSE, CAPILLARY: Glucose-Capillary: 93 mg/dL (ref 70–99)

## 2012-07-20 MED ORDER — FLUDEOXYGLUCOSE F - 18 (FDG) INJECTION
18.7000 | Freq: Once | INTRAVENOUS | Status: AC | PRN
  Administered 2012-07-20: 18.7 via INTRAVENOUS

## 2012-07-20 NOTE — Progress Notes (Signed)
Patient is a 67 year old male diagnosed with laryngeal cancer. He is a patient of Dr. Gaylyn Rong and Basilio Cairo.  Past medical history includes tuberculosis, learning disabilities, and tobacco usage.  Medications include hydrocodone.  Labs were reviewed.  Height: 68 inches. Weight: 119.7 pounds January 31. Usual body weight: 140-145 pounds. BMI: 18.2.  Patient reports he has a sore throat and he experiences dysphagia with both liquids and solids. He does have constipation. He and his wife share the duties of cooking in the home. He has been drinking 2 boost plus a day.  Nutrition diagnosis: Unintended weight loss related to diagnosis of laryngeal cancer and associated treatments as evidenced by no prior need for nutrition related information.  Intervention: I educated patient to increase calories and protein in soft moist foods throughout the day. I've encouraged oral nutrition supplements 3 times a day between meals. I provided strategies for eating with sore throat. I've also educated patient on strategies to minimize constipation. I have provided oral nutrition supplement samples, fact sheets, and recipes for patient. I've answered his questions. Teach back method used. Patient given contact information for further questions or concerns.  Monitoring, evaluation, goals: Patient will tolerate increased oral intake to minimize further weight loss.  Next visit: I will be happy to followup with patient once treatment plan or scheduled.

## 2012-07-21 ENCOUNTER — Encounter: Payer: Self-pay | Admitting: Oncology

## 2012-07-29 ENCOUNTER — Encounter (HOSPITAL_COMMUNITY): Payer: Self-pay | Admitting: Dentistry

## 2012-07-29 ENCOUNTER — Ambulatory Visit (HOSPITAL_COMMUNITY): Payer: Self-pay | Admitting: Dentistry

## 2012-07-29 VITALS — BP 111/76 | HR 94 | Temp 98.1°F | Ht 68.0 in | Wt 117.0 lb

## 2012-07-29 DIAGNOSIS — C321 Malignant neoplasm of supraglottis: Secondary | ICD-10-CM

## 2012-07-29 DIAGNOSIS — Z0189 Encounter for other specified special examinations: Secondary | ICD-10-CM

## 2012-07-29 DIAGNOSIS — K029 Dental caries, unspecified: Secondary | ICD-10-CM

## 2012-07-29 DIAGNOSIS — M270 Developmental disorders of jaws: Secondary | ICD-10-CM

## 2012-07-29 DIAGNOSIS — M264 Malocclusion, unspecified: Secondary | ICD-10-CM

## 2012-07-29 DIAGNOSIS — M278 Other specified diseases of jaws: Secondary | ICD-10-CM

## 2012-07-29 DIAGNOSIS — K0530 Chronic periodontitis, unspecified: Secondary | ICD-10-CM

## 2012-07-29 NOTE — Patient Instructions (Signed)

## 2012-07-29 NOTE — Progress Notes (Signed)
DENTAL CONSULTATION  Date of Consultation:  07/29/2012 Patient Name:   Cody [name] Date of Birth:   03-Apr-1946 Medical Record Number: [id number]  VITALS: BP 111/76  Pulse 94  Temp(Src) 98.1 F (36.7 C) (Oral)  Ht 5\' 8"  (1.727 m)  Wt 117 lb (53.071 kg)  BMI 17.79 kg/m2   CHIEF COMPLAINT: Patient referred for a preradiation therapy dental consultation.  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 Patient with anticipated total laryngectomy with neck dissection followed by postoperative radiation therapy. Patient now seen as part of a preradiation therapy dental protocol evaluation.   The patient currently denies acute toothache, swellings, or abscesses at this time. Patient previously had teeth that hurt and he "pulled the teeth" himself. She has not seen a dentist for a" a long, long time". This is probably greater than 20 years ago. Patient has no regular primary dentist. Patient does not seek regular dental care. Patient knows that his teeth are bad and that they may" all need to come out".   Patient Active Problem [name]  Diagnosis  . Carcinoma of supraglottis  . Chronic periodontitis    PMH: Past Medical History  Diagnosis Date  . TB (pulmonary tuberculosis) 1998  . Learning disabilities   . Cough   . 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 Difficulty swallowing   . Pain on swallowing   . Abnormal weight loss documented 07/07/12     20 lbs  . Referred otalgia     Right Ear    PSH: Past Surgical History  Procedure Laterality Date  . Colonoscopy  2006    neg.   . Direct laryngoscopy  07/09/11    Epiglottic mass  . Direct laryngoscopy  07/08/2012    Procedure: DIRECT LARYNGOSCOPY;  Surgeon: Serena Colonel, MD;  Location: [facility] OR;  Service: ENT;  Laterality: N/A;  . Esophagoscopy  07/08/2012    Procedure: ESOPHAGOSCOPY;  Surgeon: Serena Colonel, MD;   Location: [facility] OR;  Service: ENT;  Laterality: N/A;    ALLERGIES: No Known Allergies  MEDICATIONS: Current Outpatient Prescriptions  Medication Sig Dispense Refill  . HYDROcodone-acetaminophen (HYCET) 7.5-325 mg/15 ml solution Take by mouth 4 (four) times daily as needed.       No current facility-administered medications for this visit.    LABS: Lab Results  Component Value Date   WBC 5.0 07/03/2012   HGB 13.0 07/03/2012   HCT 37.0* 07/03/2012   MCV 92.5 07/03/2012   PLT 172 07/03/2012      Component Value Date/Time   NA 135 07/03/2012 1937   K 3.7 07/03/2012 1937   CL 99 07/03/2012 1937   CO2 28 07/03/2012 1937   GLUCOSE 91 07/03/2012 1937   BUN 11 07/03/2012 1937   CREATININE 1.02 07/03/2012 1937   CALCIUM 9.1 07/03/2012 1937   GFRNONAA 75* 07/03/2012 1937   GFRAA 87* 07/03/2012 1937   No results found for this basename: INR, PROTIME   No results found for this basename: PTT    SOCIAL HISTORY: History   Social History  . Marital Status: Single    Spouse Name: N/A    Number of Children: 2  . Years of Education: N/A   Occupational History  .      [employer]   Social History Main Topics  . Smoking status: Former Smoker -- 0.25 packs/day for 40 years  Types: Cigarettes    Quit date: 07/03/2012  . Smokeless tobacco: Never Used     Comment: 1 pack per week  . Alcohol Use: 1.2 oz/week    2 Glasses of wine per week     Comment: occasional- Beer  . Drug Use: No  . Sexually Active: No   Other Topics Concern  . Not on file   Social History Narrative  . No narrative on file    FAMILY HISTORY: Family History  Problem Relation Age of Onset  . Vascular Disease Mother   . Cancer Father     unk cancer  . Prostate cancer Brother      REVIEW OF SYSTEMS: Reviewed with patient and included in dental consultation record.  DENTAL HISTORY: CHIEF COMPLAINT: Patient referred for a preradiation therapy dental consultation.  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 Patient with anticipated total laryngectomy with neck dissection followed by postoperative radiation therapy. Patient now seen as part of a preradiation therapy dental protocol evaluation.   The patient currently denies acute toothache, swellings, or abscesses at this time. Patient previously had teeth that hurt and he "pulled the teeth" himself. She has not seen a dentist for a" a long, long time". This is probably greater than 20 years ago. Patient has no regular primary dentist. Patient does not seek regular dental care. Patient knows that his teeth are bad and that they may" all need to come out".  DENTAL EXAMINATION:  GENERAL: The patient is a well-developed, slightly built male in no acute distress. HEAD AND NECK: Right and left neck lymphadenopathy palpated. Patient denies acute TMJ symptoms. INTRAORAL EXAM: Patient has normal saliva. Patient has a small, mid palatal torus. Patient has buccal exostoses in the area of the lower first molars #19 and number 30. DENTITION: Patient is missing tooth numbers 7, 8, 9, 14, and 20. PERIODONTAL: Patient has chronic, advanced periodontal disease with plaque and calculus accumulations, generalized gingival recession, and generalized tooth mobility. DENTAL CARIES/SUBOPTIMAL RESTORATIONS: Patient does not have significant dental caries but has multiple flexure lesions. ENDODONTIC: Patient currently denies acute pulpitis symptoms. Patient has multiple areas of periapical pathology and radiolucency. CROWN AND BRIDGE: There are no crown restorations noted. PROSTHODONTIC: Patient has no partial dentures. OCCLUSION: Patient has a poor occlusal scheme secondary to multiple missing teeth, supra-eruption and drifting of the unopposed teeth into the edentulous areas, and lack of replacement of the missing teeth with dental prostheses.  RADIOGRAPHIC INTERPRETATION: An  orthopantogram was obtained and supplemented with a full series of dental radiographs. There multiple missing teeth. There multiple areas of apical radiolucency associated with tooth numbers 3, 5, 15, 24, and 25. There is moderate to severe bone loss. There is radiographic calculus noted. There is supra-eruption and drifting of the unopposed teeth into the edentulous areas.   ASSESSMENTS: 1. Chronic apical periodontitis 2. Chronic periodontitis with bone loss 3. Plaque and calculus accumulations 4. Multiple loose teeth 5. Generalized gingival recession 6. Multiple abfraction lesions. 7. Small palatal torus 8. Bilateral mandibular buccal exostoses 9. Supra-eruption and drifting of the unopposed teeth into the edentulous areas 10. Malocclusion 11. History of oral neglect  PLAN/RECOMMENDATIONS: 1. I discussed the risks, benefits, and complications of various treatment options with the patient in relationship to his medical and dental conditions, and anticipated chemoradiation therapy, and side effects to include xerostomia, radiation caries, trismus, mucositis, taste changes, gum and jawbone changes, and risk  for infection, bleeding, and osteoradionecrosis. We discussed various treatment options to include no treatment, multiple extractions with alveoloplasty, pre-prosthetic surgery as indicated, periodontal therapy, dental restorations, root canal therapy, crown and bridge therapy, implant therapy, and replacement of missing teeth as indicated. The patient currently wishes to proceed with extraction of all remaining teeth with alveoloplasty and pre-prosthetic surgery as indicated in the operating room with general anesthesia. After a phone discussion with Dr. Beverlee Nims, it was determined that the dental surgery will take place at the same time the patient is undergoing his cancer resection and reconstruction surgery on 08/13/2012.  Patient and daughter expressed understanding and agreement with  these joint procedures.   2. Upper and lower presurgical alginate impressions-today. 3. Upper and lower complete dentures to be fabricated by the primary dentist of his choice 3 months after the radiation therapy has been completed. 4. Discussion of findings with medical team and coordination of future medical and dental care.   Charlynne Pander, DDS

## 2012-08-03 ENCOUNTER — Encounter (HOSPITAL_COMMUNITY): Payer: Self-pay | Admitting: Pharmacy Technician

## 2012-08-04 NOTE — Pre-Procedure Instructions (Signed)
Cody [name]  08/04/2012   Your procedure is scheduled on:  Friday, March 7th  Report to [facility] at 0530 AM.  Call this number if you have problems the morning of surgery: [phone]   Remember:   Do not eat food or drink liquids after midnight.    Take these medicines the morning of surgery with A SIP OF WATER: hydrocodone if needed   Do not wear jewelry, make-up or nail polish.  Do not wear lotions, powders, or perfumes,deodorant.  Do not shave 48 hours prior to surgery. Men may shave face and neck.  Do not bring valuables to the hospital.  Contacts, dentures or bridgework may not be worn into surgery.  Leave suitcase in the car. After surgery it may be brought to your room.  For patients admitted to the hospital, checkout time is 11:00 AM the day of  discharge.      Special Instructions: Shower using CHG 2 nights before surgery and the night before surgery.  If you shower the day of surgery use CHG.  Use special wash - you have one bottle of CHG for all showers.  You should use approximately 1/3 of the bottle for each shower.   Please read over the following fact sheets that you were given: Pain Booklet, Coughing and Deep Breathing, MRSA Information and Surgical Site Infection Prevention

## 2012-08-05 ENCOUNTER — Encounter (HOSPITAL_COMMUNITY): Payer: Self-pay

## 2012-08-05 ENCOUNTER — Encounter (HOSPITAL_COMMUNITY)
Admission: RE | Admit: 2012-08-05 | Discharge: 2012-08-05 | Disposition: A | Discharge status: Home / Self Care | Payer: Medicare Other | Source: Ambulatory Visit | Attending: Otolaryngology | Admitting: Otolaryngology

## 2012-08-05 HISTORY — DX: Unspecified osteoarthritis, unspecified site: M19.90

## 2012-08-05 LAB — COMPREHENSIVE METABOLIC PANEL
AST: 20 U/L (ref 0–37)
Albumin: 3.2 g/dL — ABNORMAL LOW (ref 3.5–5.2)
BUN: 9 mg/dL (ref 6–23)
Calcium: 9.8 mg/dL (ref 8.4–10.5)
Chloride: 97 mEq/L (ref 96–112)
Creatinine, Ser: 0.97 mg/dL (ref 0.50–1.35)
Total Bilirubin: 0.5 mg/dL (ref 0.3–1.2)
Total Protein: 8.6 g/dL — ABNORMAL HIGH (ref 6.0–8.3)

## 2012-08-05 LAB — CBC
MCHC: 35.2 g/dL (ref 30.0–36.0)
MCV: 90.4 fL (ref 78.0–100.0)
Platelets: 184 10*3/uL (ref 150–400)
RDW: 13.6 % (ref 11.5–15.5)
WBC: 5.9 10*3/uL (ref 4.0–10.5)

## 2012-08-05 LAB — TYPE AND SCREEN
ABO/RH(D): B NEG
Antibody Screen: NEGATIVE

## 2012-08-05 LAB — ABO/RH: ABO/RH(D): B NEG

## 2012-08-05 NOTE — Progress Notes (Signed)
4540.Marland KitchenMarland KitchenHave requested orders [facility] Rathke part of surgery and surgical wording on permit...Marland KitchenDA And, pt and daughter states he had EKG at PCP's office.......have requested that also.......Marland KitchenLabels on paperwork is Wrong per the daughter.I have gotten the correct labels.Marland KitchenMarland KitchenDA

## 2012-08-12 ENCOUNTER — Inpatient Hospital Stay (HOSPITAL_COMMUNITY)
Admission: AD | Admit: 2012-08-12 | Discharge: 2012-08-24 | DRG: 012 | Disposition: A | Discharge status: Home / Self Care | Payer: Medicare Other | Source: Ambulatory Visit | Attending: Otolaryngology | Admitting: Otolaryngology

## 2012-08-12 DIAGNOSIS — E46 Unspecified protein-calorie malnutrition: Secondary | ICD-10-CM | POA: Diagnosis present

## 2012-08-12 DIAGNOSIS — K0530 Chronic periodontitis, unspecified: Secondary | ICD-10-CM | POA: Diagnosis present

## 2012-08-12 DIAGNOSIS — C321 Malignant neoplasm of supraglottis: Principal | ICD-10-CM | POA: Diagnosis present

## 2012-08-12 MED ORDER — SODIUM CHLORIDE 0.9 % IV SOLN
INTRAVENOUS | Status: DC
  Administered 2012-08-12: via INTRAVENOUS

## 2012-08-12 MED ORDER — CEFAZOLIN SODIUM-DEXTROSE 2-3 GM-% IV SOLR
2.0000 g | INTRAVENOUS | Status: DC
  Filled 2012-08-12: qty 50

## 2012-08-13 ENCOUNTER — Encounter (HOSPITAL_COMMUNITY)
Admission: AD | Disposition: A | Discharge status: Home / Self Care | Payer: Self-pay | Source: Ambulatory Visit | Attending: Otolaryngology

## 2012-08-13 ENCOUNTER — Encounter (HOSPITAL_COMMUNITY): Payer: Self-pay | Admitting: Anesthesiology

## 2012-08-13 ENCOUNTER — Other Ambulatory Visit: Payer: Self-pay

## 2012-08-13 ENCOUNTER — Inpatient Hospital Stay: Admission: AD | Admit: 2012-08-13 | Payer: Medicare Other | Source: Ambulatory Visit | Admitting: Otolaryngology

## 2012-08-13 ENCOUNTER — Inpatient Hospital Stay (HOSPITAL_COMMUNITY): Payer: Medicare Other | Admitting: Anesthesiology

## 2012-08-13 HISTORY — PX: RADICAL NECK DISSECTION: SHX2284

## 2012-08-13 HISTORY — PX: TRACHEOSTOMY TUBE PLACEMENT: SHX814

## 2012-08-13 HISTORY — PX: PECTORALIS FLAP: SHX6228

## 2012-08-13 LAB — CBC
Hemoglobin: 14.5 g/dL (ref 13.0–17.0)
MCH: 31.9 pg (ref 26.0–34.0)
MCHC: 35.3 g/dL (ref 30.0–36.0)
Platelets: 174 10*3/uL (ref 150–400)
RBC: 4.55 MIL/uL (ref 4.22–5.81)

## 2012-08-13 LAB — SURGICAL PCR SCREEN
MRSA, PCR: NEGATIVE
Staphylococcus aureus: NEGATIVE

## 2012-08-13 LAB — BASIC METABOLIC PANEL
Calcium: 9.5 mg/dL (ref 8.4–10.5)
GFR calc Af Amer: 80 mL/min — ABNORMAL LOW (ref >90)
GFR calc non Af Amer: 69 mL/min — ABNORMAL LOW (ref >90)
Glucose, Bld: 129 mg/dL — ABNORMAL HIGH (ref 70–99)
Potassium: 3.9 mEq/L (ref 3.5–5.1)
Sodium: 135 mEq/L (ref 135–145)

## 2012-08-13 LAB — URINALYSIS, ROUTINE W REFLEX MICROSCOPIC
Bilirubin Urine: NEGATIVE
Nitrite: NEGATIVE
Specific Gravity, Urine: 1.017 (ref 1.005–1.030)
pH: 6.5 (ref 5.0–8.0)

## 2012-08-13 LAB — URINE MICROSCOPIC-ADD ON

## 2012-08-13 SURGERY — DISSECTION, NECK, RADICAL
Anesthesia: General | Site: Neck | Wound class: Clean Contaminated

## 2012-08-13 MED ORDER — PROPOFOL 10 MG/ML IV BOLUS
INTRAVENOUS | Status: DC | PRN
  Administered 2012-08-13: 160 mg via INTRAVENOUS

## 2012-08-13 MED ORDER — ROCURONIUM BROMIDE 100 MG/10ML IV SOLN
INTRAVENOUS | Status: DC | PRN
  Administered 2012-08-13: 10 mg via INTRAVENOUS
  Administered 2012-08-13: 50 mg via INTRAVENOUS
  Administered 2012-08-13: 10 mg via INTRAVENOUS
  Administered 2012-08-13: 40 mg via INTRAVENOUS
  Administered 2012-08-13: 10 mg via INTRAVENOUS

## 2012-08-13 MED ORDER — PROMETHAZINE HCL 25 MG RE SUPP: 25.0000 mg | Freq: Four times a day (QID) | RECTAL | Status: DC | PRN

## 2012-08-13 MED ORDER — DEXTROSE 5 % IV SOLN
INTRAVENOUS | Status: DC | PRN
  Administered 2012-08-13: 08:00:00 via INTRAVENOUS

## 2012-08-13 MED ORDER — LACTATED RINGERS IV SOLN
INTRAVENOUS | Status: DC | PRN
  Administered 2012-08-13 (×3): via INTRAVENOUS

## 2012-08-13 MED ORDER — ATROPINE SULFATE 1 MG/ML IJ SOLN
INTRAMUSCULAR | Status: DC | PRN
  Administered 2012-08-13: 0.4 mg via INTRAVENOUS

## 2012-08-13 MED ORDER — SODIUM CHLORIDE 0.9 % IR SOLN
Status: DC | PRN
  Administered 2012-08-13 (×2): 1000 mL

## 2012-08-13 MED ORDER — GLYCOPYRROLATE 0.2 MG/ML IJ SOLN
INTRAMUSCULAR | Status: DC | PRN
  Administered 2012-08-13: 0.6 mg via INTRAVENOUS
  Administered 2012-08-13: 0.2 mg via INTRAVENOUS

## 2012-08-13 MED ORDER — PROMETHAZINE HCL 25 MG PO TABS: 25.0000 mg | ORAL_TABLET | Freq: Four times a day (QID) | ORAL | Status: DC | PRN

## 2012-08-13 MED ORDER — SODIUM CHLORIDE 0.9 % IV SOLN
3.0000 g | Freq: Four times a day (QID) | INTRAVENOUS | Status: AC
  Administered 2012-08-13 – 2012-08-16 (×12): 3 g via INTRAVENOUS
  Filled 2012-08-13 (×12): qty 3

## 2012-08-13 MED ORDER — LACTATED RINGERS IV SOLN
INTRAVENOUS | Status: DC | PRN
  Administered 2012-08-13 (×3): via INTRAVENOUS

## 2012-08-13 MED ORDER — MIDAZOLAM HCL 5 MG/5ML IJ SOLN
INTRAMUSCULAR | Status: DC | PRN
  Administered 2012-08-13: 0.5 mg via INTRAVENOUS
  Administered 2012-08-13: 1 mg via INTRAVENOUS
  Administered 2012-08-13: 0.5 mg via INTRAVENOUS

## 2012-08-13 MED ORDER — ONDANSETRON HCL 4 MG/2ML IJ SOLN
INTRAMUSCULAR | Status: DC | PRN
  Administered 2012-08-13: 4 mg via INTRAVENOUS

## 2012-08-13 MED ORDER — SODIUM CHLORIDE 0.9 % IV SOLN
3.0000 g | Freq: Once | INTRAVENOUS | Status: DC
  Filled 2012-08-13: qty 3

## 2012-08-13 MED ORDER — MORPHINE SULFATE 2 MG/ML IJ SOLN
2.0000 mg | INTRAMUSCULAR | Status: DC | PRN
  Administered 2012-08-13 – 2012-08-16 (×10): 2 mg via INTRAVENOUS
  Filled 2012-08-13 (×10): qty 1

## 2012-08-13 MED ORDER — LIDOCAINE HCL (CARDIAC) 20 MG/ML IV SOLN
INTRAVENOUS | Status: DC | PRN
  Administered 2012-08-13: 60 mg via INTRAVENOUS

## 2012-08-13 MED ORDER — ARTIFICIAL TEARS OP OINT
TOPICAL_OINTMENT | OPHTHALMIC | Status: DC | PRN
  Administered 2012-08-13: 1 via OPHTHALMIC

## 2012-08-13 MED ORDER — SODIUM CHLORIDE 0.9 % IV SOLN
INTRAVENOUS | Status: DC | PRN
  Administered 2012-08-13: 13:00:00 via INTRAVENOUS

## 2012-08-13 MED ORDER — MIDAZOLAM HCL 2 MG/2ML IJ SOLN
1.0000 mg | INTRAMUSCULAR | Status: DC | PRN
  Filled 2012-08-13: qty 2

## 2012-08-13 MED ORDER — BACITRACIN ZINC 500 UNIT/GM EX OINT
1.0000 "application " | TOPICAL_OINTMENT | Freq: Three times a day (TID) | CUTANEOUS | Status: DC
  Administered 2012-08-13 – 2012-08-24 (×32): 1 via TOPICAL
  Filled 2012-08-13 (×3): qty 15

## 2012-08-13 MED ORDER — DEXTROSE-NACL 5-0.9 % IV SOLN
INTRAVENOUS | Status: DC
  Administered 2012-08-13 – 2012-08-14 (×2): 1000 mL via INTRAVENOUS
  Administered 2012-08-16 – 2012-08-18 (×2): via INTRAVENOUS

## 2012-08-13 MED ORDER — HYDROMORPHONE HCL PF 1 MG/ML IJ SOLN
0.2500 mg | INTRAMUSCULAR | Status: DC | PRN
  Administered 2012-08-14: 0.5 mg via INTRAVENOUS
  Filled 2012-08-13: qty 1

## 2012-08-13 MED ORDER — CEFAZOLIN SODIUM-DEXTROSE 2-3 GM-% IV SOLR
INTRAVENOUS | Status: DC | PRN
  Administered 2012-08-13: 2 g via INTRAVENOUS

## 2012-08-13 MED ORDER — ALBUMIN HUMAN 5 % IV SOLN
INTRAVENOUS | Status: DC | PRN
  Administered 2012-08-13 (×2): via INTRAVENOUS

## 2012-08-13 MED ORDER — NEOSTIGMINE METHYLSULFATE 1 MG/ML IJ SOLN
INTRAMUSCULAR | Status: DC | PRN
  Administered 2012-08-13: 4 mg via INTRAVENOUS

## 2012-08-13 MED ORDER — SODIUM CHLORIDE 0.9 % IV SOLN
3.0000 g | INTRAVENOUS | Status: DC | PRN
  Administered 2012-08-13: 3 g via INTRAVENOUS

## 2012-08-13 MED ORDER — PHENYLEPHRINE HCL 10 MG/ML IJ SOLN
INTRAMUSCULAR | Status: DC | PRN
  Administered 2012-08-13 (×12): 40 ug via INTRAVENOUS

## 2012-08-13 MED ORDER — FENTANYL CITRATE 0.05 MG/ML IJ SOLN
50.0000 ug | Freq: Once | INTRAMUSCULAR | Status: AC
  Administered 2012-08-13: 50 ug via INTRAVENOUS
  Filled 2012-08-13: qty 2

## 2012-08-13 MED ORDER — PROMETHAZINE HCL 25 MG/ML IJ SOLN: 6.2500 mg | INTRAMUSCULAR | Status: DC | PRN

## 2012-08-13 MED ORDER — CEFAZOLIN SODIUM-DEXTROSE 2-3 GM-% IV SOLR
2.0000 g | Freq: Once | INTRAVENOUS | Status: AC
  Administered 2012-08-13: 2 g via INTRAVENOUS
  Filled 2012-08-13: qty 50

## 2012-08-13 MED ORDER — FENTANYL CITRATE 0.05 MG/ML IJ SOLN
INTRAMUSCULAR | Status: DC | PRN
  Administered 2012-08-13: 50 ug via INTRAVENOUS
  Administered 2012-08-13: 25 ug via INTRAVENOUS
  Administered 2012-08-13: 50 ug via INTRAVENOUS
  Administered 2012-08-13: 100 ug via INTRAVENOUS
  Administered 2012-08-13: 50 ug via INTRAVENOUS
  Administered 2012-08-13 (×2): 100 ug via INTRAVENOUS
  Administered 2012-08-13: 25 ug via INTRAVENOUS

## 2012-08-13 SURGICAL SUPPLY — 139 items
APL SKNCLS STERI-STRIP NONHPOA (GAUZE/BANDAGES/DRESSINGS)
APPLIER CLIP 9.375 MED OPEN (MISCELLANEOUS) ×5
APPLIER CLIP 9.375 SM OPEN (CLIP)
APR CLP MED 9.3 20 MLT OPN (MISCELLANEOUS) ×4
APR CLP SM 9.3 20 MLT OPN (CLIP)
ATCH SMKEVC FLXB CAUT HNDSWH (FILTER) ×4 IMPLANT
BAG DECANTER FOR FLEXI CONT (MISCELLANEOUS) ×3 IMPLANT
BALLN PULM 15 16.5 18X75 (BALLOONS)
BALLOON PULM 15 16.5 18X75 (BALLOONS) IMPLANT
BENZOIN TINCTURE PRP APPL 2/3 (GAUZE/BANDAGES/DRESSINGS) IMPLANT
BIOPATCH RED 1 DISK 7.0 (GAUZE/BANDAGES/DRESSINGS) ×3 IMPLANT
BLADE SURG 10 STRL SS (BLADE) ×2 IMPLANT
BLADE SURG 11 STRL SS (BLADE) IMPLANT
BLADE SURG 15 STRL LF DISP TIS (BLADE) ×17 IMPLANT
BLADE SURG 15 STRL SS (BLADE) ×25
BLADE SURG ROTATE 9660 (MISCELLANEOUS) ×2 IMPLANT
CANISTER SUCTION 2500CC (MISCELLANEOUS) ×8 IMPLANT
CHLORAPREP W/TINT 26ML (MISCELLANEOUS) ×3 IMPLANT
CLEANER TIP ELECTROSURG 2X2 (MISCELLANEOUS) ×5 IMPLANT
CLIP APPLIE 9.375 MED OPEN (MISCELLANEOUS) ×1 IMPLANT
CLIP APPLIE 9.375 SM OPEN (CLIP) IMPLANT
CLOTH BEACON ORANGE TIMEOUT ST (SAFETY) ×13 IMPLANT
CONNECTOR UNIV Y (MISCELLANEOUS) IMPLANT
CONT SPEC 4OZ CLIKSEAL STRL BL (MISCELLANEOUS) ×2 IMPLANT
CORDS BIPOLAR (ELECTRODE) ×5 IMPLANT
COVER SURGICAL LIGHT HANDLE (MISCELLANEOUS) ×13 IMPLANT
COVER TABLE BACK 60X90 (DRAPES) ×3 IMPLANT
CRADLE DONUT ADULT HEAD (MISCELLANEOUS) ×5 IMPLANT
DECANTER SPIKE VIAL GLASS SM (MISCELLANEOUS) ×3 IMPLANT
DRAIN CHANNEL 15F RND FF W/TCR (WOUND CARE) IMPLANT
DRAIN CHANNEL 19F RND (DRAIN) ×8 IMPLANT
DRAPE INCISE 23X17 IOBAN STRL (DRAPES)
DRAPE INCISE 23X17 STRL (DRAPES) ×3 IMPLANT
DRAPE INCISE IOBAN 23X17 STRL (DRAPES) IMPLANT
DRAPE ORTHO SPLIT 77X108 STRL (DRAPES)
DRAPE PROXIMA HALF (DRAPES) ×5 IMPLANT
DRAPE SURG 17X23 STRL (DRAPES) ×12 IMPLANT
DRAPE SURG ORHT 6 SPLT 77X108 (DRAPES) ×6 IMPLANT
DRAPE WARM FLUID 44X44 (DRAPE) ×3 IMPLANT
DRSG PAD ABDOMINAL 8X10 ST (GAUZE/BANDAGES/DRESSINGS) ×3 IMPLANT
DRSG TEGADERM 4X4.75 (GAUZE/BANDAGES/DRESSINGS) ×2 IMPLANT
ELECT BLADE 6.5 EXT (BLADE) IMPLANT
ELECT CAUTERY BLADE 6.4 (BLADE) ×5 IMPLANT
ELECT COATED BLADE 2.86 ST (ELECTRODE) ×5 IMPLANT
ELECT REM PT RETURN 9FT ADLT (ELECTROSURGICAL) ×5
ELECTRODE REM PT RTRN 9FT ADLT (ELECTROSURGICAL) ×7 IMPLANT
EVACUATOR SILICONE 100CC (DRAIN) ×11 IMPLANT
EVACUATOR SMOKE ACCUVAC VALLEY (FILTER) ×1
GAUZE PACKING FOLDED 2  STR (GAUZE/BANDAGES/DRESSINGS) ×1
GAUZE PACKING FOLDED 2 STR (GAUZE/BANDAGES/DRESSINGS) ×4 IMPLANT
GAUZE SPONGE 4X4 16PLY XRAY LF (GAUZE/BANDAGES/DRESSINGS) ×22 IMPLANT
GAUZE XEROFORM 5X9 LF (GAUZE/BANDAGES/DRESSINGS) ×2 IMPLANT
GLOVE BIO SURGEON STRL SZ7.5 (GLOVE) ×15 IMPLANT
GLOVE BIOGEL PI IND STRL 6.5 (GLOVE) ×1 IMPLANT
GLOVE BIOGEL PI IND STRL 7.5 (GLOVE) ×3 IMPLANT
GLOVE BIOGEL PI IND STRL 8 (GLOVE) ×4 IMPLANT
GLOVE BIOGEL PI INDICATOR 6.5 (GLOVE) ×1
GLOVE BIOGEL PI INDICATOR 7.5 (GLOVE) ×3
GLOVE BIOGEL PI INDICATOR 8 (GLOVE) ×1
GLOVE ECLIPSE 7.5 STRL STRAW (GLOVE) ×11 IMPLANT
GLOVE SURG ORTHO 8.0 STRL STRW (GLOVE) ×3 IMPLANT
GLOVE SURG SS PI 6.0 STRL IVOR (GLOVE) ×2 IMPLANT
GLOVE SURG SS PI 6.5 STRL IVOR (GLOVE) ×3 IMPLANT
GLOVE SURG SS PI 7.5 STRL IVOR (GLOVE) ×6 IMPLANT
GOWN PREVENTION PLUS XLARGE (GOWN DISPOSABLE) ×5 IMPLANT
GOWN STRL NON-REIN LRG LVL3 (GOWN DISPOSABLE) ×26 IMPLANT
GOWN STRL REIN 3XL LVL4 (GOWN DISPOSABLE) ×5 IMPLANT
GUARD TEETH (MISCELLANEOUS) IMPLANT
HEMOSTAT SURGICEL .5X2 ABSORB (HEMOSTASIS) IMPLANT
KIT BASIN OR (CUSTOM PROCEDURE TRAY) ×13 IMPLANT
KIT ROOM TURNOVER OR (KITS) ×13 IMPLANT
LOCATOR NERVE 3 VOLT (DISPOSABLE) IMPLANT
MANIFOLD NEPTUNE WASTE (CANNULA) ×3 IMPLANT
MARKER SKIN DUAL TIP RULER LAB (MISCELLANEOUS) ×3 IMPLANT
NDL BLUNT 16X1.5 OR ONLY (NEEDLE) ×3 IMPLANT
NDL DENTAL 27 LONG (NEEDLE) IMPLANT
NDL HYPO 25GX1X1/2 BEV (NEEDLE) ×3 IMPLANT
NEEDLE BLUNT 16X1.5 OR ONLY (NEEDLE) IMPLANT
NEEDLE DENTAL 27 LONG (NEEDLE) IMPLANT
NEEDLE HYPO 25GX1X1/2 BEV (NEEDLE) IMPLANT
NS IRRIG 1000ML POUR BTL (IV SOLUTION) ×14 IMPLANT
PACK EENT II TURBAN DRAPE (CUSTOM PROCEDURE TRAY) ×10 IMPLANT
PACK GENERAL/GYN (CUSTOM PROCEDURE TRAY) ×3 IMPLANT
PACK SURGICAL SETUP 50X90 (CUSTOM PROCEDURE TRAY) ×2 IMPLANT
PAD ARMBOARD 7.5X6 YLW CONV (MISCELLANEOUS) ×22 IMPLANT
PATTIES SURGICAL .5 X3 (DISPOSABLE) IMPLANT
PENCIL BUTTON HOLSTER BLD 10FT (ELECTRODE) ×5 IMPLANT
PENCIL FOOT CONTROL (ELECTRODE) ×5 IMPLANT
PREFILTER EVAC NS 1 1/3-3/8IN (MISCELLANEOUS) ×5 IMPLANT
SPECIMEN JAR LARGE (MISCELLANEOUS) ×2 IMPLANT
SPECIMEN JAR MEDIUM (MISCELLANEOUS) IMPLANT
SPECIMEN JAR SMALL (MISCELLANEOUS) IMPLANT
SPONGE GAUZE 4X4 12PLY (GAUZE/BANDAGES/DRESSINGS) ×6 IMPLANT
SPONGE INTESTINAL PEANUT (DISPOSABLE) IMPLANT
SPONGE LAP 18X18 X RAY DECT (DISPOSABLE) ×4 IMPLANT
SPONGE SURGIFOAM ABS GEL 100 (HEMOSTASIS) IMPLANT
SPONGE SURGIFOAM ABS GEL 12-7 (HEMOSTASIS) IMPLANT
SPONGE SURGIFOAM ABS GEL SZ50 (HEMOSTASIS) IMPLANT
STAPLER VISISTAT 35W (STAPLE) ×12 IMPLANT
STRIP CLOSURE SKIN 1/2X4 (GAUZE/BANDAGES/DRESSINGS) ×3 IMPLANT
SUCTION FRAZIER TIP 10 FR DISP (SUCTIONS) ×5 IMPLANT
SUT CHROMIC 2 0 SH (SUTURE) ×5 IMPLANT
SUT CHROMIC 3 0 PS 2 (SUTURE) ×8 IMPLANT
SUT CHROMIC 3 0 SH 27 (SUTURE) ×9 IMPLANT
SUT CHROMIC 4 0 P 3 18 (SUTURE) IMPLANT
SUT CHROMIC 5 0 P 3 (SUTURE) IMPLANT
SUT ETHILON 3 0 PS 1 (SUTURE) ×9 IMPLANT
SUT ETHILON 5 0 PS 2 18 (SUTURE) IMPLANT
SUT MNCRL AB 3-0 PS2 18 (SUTURE) ×2 IMPLANT
SUT MON AB 2-0 CT1 36 (SUTURE) ×2 IMPLANT
SUT PDS AB 0 CT 36 (SUTURE) IMPLANT
SUT PLAIN 5 0 P 3 18 (SUTURE) ×2 IMPLANT
SUT PROLENE 3 0 PS 1 (SUTURE) ×4 IMPLANT
SUT SILK 2 0 (SUTURE) ×5
SUT SILK 2 0 REEL (SUTURE) IMPLANT
SUT SILK 2-0 18XBRD TIE 12 (SUTURE) ×1 IMPLANT
SUT SILK 3 0 SH CR/8 (SUTURE) ×7 IMPLANT
SUT SILK 4 0 REEL (SUTURE) ×19 IMPLANT
SUT SILK 4 0 TIE 10X30 (SUTURE) ×5 IMPLANT
SUT SILK 4 0 TIES 17X18 (SUTURE) ×5 IMPLANT
SUT VIC AB 3-0 FS2 27 (SUTURE) IMPLANT
SUT VIC AB 3-0 SH 18 (SUTURE) ×4 IMPLANT
SUT VIC AB 3-0 SH 8-18 (SUTURE) ×4 IMPLANT
SYR 20CC LL (SYRINGE) ×5 IMPLANT
SYR 50ML SLIP (SYRINGE) ×5 IMPLANT
SYR BULB IRRIGATION 50ML (SYRINGE) ×5 IMPLANT
SYR CONTROL 10ML LL (SYRINGE) IMPLANT
SYR INFLATE BILIARY GAUGE (MISCELLANEOUS) IMPLANT
SYR TB 1ML LUER SLIP (SYRINGE) IMPLANT
TAPE CLOTH SURG 4X10 WHT LF (GAUZE/BANDAGES/DRESSINGS) ×2 IMPLANT
TOWEL OR 17X24 6PK STRL BLUE (TOWEL DISPOSABLE) ×13 IMPLANT
TOWEL OR 17X26 10 PK STRL BLUE (TOWEL DISPOSABLE) ×13 IMPLANT
TOWEL OR NON WOVEN STRL DISP B (DISPOSABLE) ×2 IMPLANT
TRAY ENT MC OR (CUSTOM PROCEDURE TRAY) ×3 IMPLANT
TRAY FOLEY CATH 14FRSI W/METER (CATHETERS) ×5 IMPLANT
TUBE CONNECTING 12X1/4 (SUCTIONS) ×15 IMPLANT
TUBE FEEDING 10FR FLEXIFLO (MISCELLANEOUS) IMPLANT
WATER STERILE IRR 1000ML POUR (IV SOLUTION) ×6 IMPLANT
YANKAUER SUCT BULB TIP NO VENT (SUCTIONS) ×5 IMPLANT

## 2012-08-13 NOTE — H&P (Signed)
Assessment   Laryngeal mass (478.79) (J38.7) Amended By: Cletus Gash 07/12/2012 15:28:25 PM EST. Discussed  Patient has a laryngeal mass with cervical adenopathy, this is likely malignant. Recommend direct microlaryngoscopy with biopsy and esophag[facility]. This was scheduled for tomorrow morning. Depending on extent of disease, patient may require referral to an academic center. Advised that patient stay free of nicotine and alcohol. We will see him tomorrow morning.  Patient agrees with the plan. Plan  I have interviewed and examined the patient and developed the proposed treatment plan.  Jefry H. Pollyann Kennedy, M.D. Reason For Visit  Cody [name] is here today at the kind request of Jethro Bolus for consultation and opinion for throat evaluation. HPI  Patient is a 67 year old male who presents for odynophagia and dysphagia for four months. This is a new problem. He states that liquids and saliva burn when going down. Solid foods hurt and there is referred right otalgia. He has noticed a knot on the right side of neck and lost about 15 pounds unintentionally. CT of neck performed 07/03/12; per report, primary malignancy suspected in the supraglottic, glottic and hyppharyngeal region with necrotic bilateral adenopathy larger on the right (right Level II region measuring up to 2.7cm). Smaller lymph nodes scattered throughout the neck bilaterally including left level II 1.5x1.5x2.6cm node. Quit smoking four days ago; smoked 1 pack per week for 20 years. Also quit alcohol a few days ago. No PMH of cardiopulmonary disease. Allergies  No Known Drug Allergies. Current Meds  No Reported Medications;; RPT. Active Problems   Laryngeal carcinoma (161.9) (C32.9) Amended By: Cletus Gash 07/12/2012 15:28:22 PM EST. PMH  Personal history of tuberculosis (V12.01) (Z86.11); 16+ yrs ago. Family Hx  Family history of pancreatic cancer: Brother (V16.0) (Z80.0) Family history of rheumatoid arthritis:  Father (V17.7) (Z82.61) Family history of seizures: Sister (V19.8) (Z84.89) Family history of vascular disorder: Mother (V17.49) (Z82.49); leg amputation. Personal Hx  Alcohol use; 2 drinks day or fewer No caffeine use Smoker 2014 (305.1) (F17.200); quit 07-02-2012. ROS  Systemic: Not feeling tired (fatigue).  No fever  and no night sweats.  Recent weight loss. Head: Headache. Eyes: No eye symptoms. Otolaryngeal: No hearing loss.  Earache.  No tinnitus  and no purulent nasal discharge.  No nasal passage blockage (stuffiness), no snoring, and no sneezing.  Hoarseness  and sore throat. Cardiovascular: No chest pain or discomfort  and no palpitations. Pulmonary: No dyspnea.  Cough.  No wheezing. Gastrointestinal: Dysphagia.  No heartburn.  No nausea, no abdominal pain, and no melena.  No diarrhea. Genitourinary: No dysuria. Endocrine: No muscle weakness. Musculoskeletal: No calf muscle cramps, no arthralgias, and no soft tissue swelling. Neurological: No dizziness, no fainting, no tingling, and no numbness. Psychological: No anxiety  and no depression. Skin: No rash. 12 system ROS was obtained and reviewed on the Health Maintenance form dated today.  Positive responses are shown above.  If the symptom is not checked, the patient has denied it. Vital Signs   Recorded by Skolimowski,Sharon on 07 Jul 2012 01:36 PM BP:124/70,  Height: 69 in, Weight: 120 lb, BMI: 17.7 kg/m2,  BMI Calculated: 17.72 ,  BSA Calculated: 1.66. Physical Exam  APPEARANCE: Well developed, thin, in no acute distress.  Normal affect, in a pleasant mood.  Oriented to time, place and person. COMMUNICATION: Normal voice   HEAD & FACE:  No scars, lesions or masses of head and face.  Sinuses nontender to palpation.  Salivary glands without mass or  tenderness.  Facial strength symmetric.   EYES: EOMI with normal primary gaze alignment. Visual acuity grossly intact.  PERRLA EXTERNAL EAR & NOSE: No scars, lesions or masses   EAC & TYMPANIC MEMBRANE:  EAC shows no obstructing lesions or debris and tympanic membranes are normal bilaterally. GROSS HEARING: Normal   TMJ:  Nontender  INTRANASAL EXAM: No polyps or purulence. Leftward septal deviation. NASOPHARYNX: Normal, without lesions. LIPS, TEETH & GUMS: No lip lesions, normal dentition and normal gums. ORAL CAVITY/OROPHARYNX:  Oral mucosa moist without lesion or asymmetry of the palate, tongue, tonsil or posterior pharynx. NECK:  2x2cm firm round mass, right Level II. THYROID:  Normal with no masses palpable.  NEUROLOGIC:  No gross CN deficits. No nystagmus noted.   LYMPHATIC:  No enlarged nodes palpable.   LARYNX (mirror exam): Could not visualize due to a strong gag reflex and excessive pooling of secretions. Results  CT scan reviewed, there is a large mass involving the supraglottic larynx and possibly the hypopharynx on the right side. There is also bilateral lymphadenopathy. Procedure  Fiberoptic Laryngoscopy Name: Cody [name]     Age: 61 year   Performing Khamiya Varin: Aquilla Hacker. Dr. Pollyann Kennedy present for exam.  The risks of the procedure are minimal and were discussed with the patient today. Pre-op Diagnosis: odynophagia  Post-op Diagnosis: TVC lesion Allergy:  reviewed allergies as listed Nasal Prep:Lidocaine/Afrin   Procedure:     With the patient seated in the exam chair, the R nasal cavity was intubated with the flexible laryngoscope.  The nasal cavity mucosa, nasopharynx, hypopharynx and larynx were all examined with findings as noted below.  The scope was then removed.  The patient tolerated the procedure well without complication or blood loss.   FINDINGS: Nasal mucosa: normal Nasopharynx: normal, septum deviated to the left. Hypopharynx: normal Larynx: excessive pooling of secretions unable to be adequately cleared. Larynx is irritated, there may be a mass on the right cord, briefly visualized.  Passenger transport manager  Electronically signed by :  Aquilla Hacker  PA-C; 07/07/2012 2:26 PM EST. Electronically signed by : Serena Colonel  M.D.; 07/07/2012 2:30 PM EST. Electronically signed by : Serena Colonel  M.D.; 07/12/2012 4:12 PM EST.

## 2012-08-13 NOTE — Progress Notes (Signed)
INITIAL NUTRITION ASSESSMENT  DOCUMENTATION CODES Per approved criteria  -Underweight   INTERVENTION: 1.  General healthful diet; recommend diet advancement as appropriate per MD.  RD to follow through inpatient stay for nutrition adequacy. 2.  Nutrition support; consider early nutrition support if unable to progress PO diet in 3-5 days and prolonged NPO status >7 days is expected.  NUTRITION DIAGNOSIS: Inadequate oral intake related to odynophagia, dysphagia as evidenced by pt with wt loss PTA, underweight, NPO.   Monitor:  1.  Food/Beverage; resume of PO diet once appropriate per MD.  Pt to meet >/=90% estimated needs. 2.  Nutrition support; consider early nutrition support if unable to progress PO diet in 3-5 days and prolonged NPO status >7 days is expected.  Reason for Assessment: MST  67 y.o. male  Admitting Dx:  Laryngeal  mass  ASSESSMENT: Pt admitted with laryngeal mass and cervical adenopathy. Pt planning for laryngectomy with neck dissection and total dental extraction due to poor dentition.  Pt for eventual radiation therapy. Pt is underweight with BMI of 17.3.  Pt with 15 lbs wt loss over the past 4 months per chart review.   Pt is not available at time of visit- in OR.  Unable to complete full nutrition assessment, however some level of malnutrition suspected.  Pt is at risk for poor nutrition over the next few days.   Height: Ht Readings from Last 1 Encounters:  08/12/12 5\' 8"  (1.727 m)    Weight: Wt Readings from Last 1 Encounters:  08/12/12 112 lb 10.5 oz (51.1 kg)    Ideal Body Weight: 70kg  % Ideal Body Weight: 73%  Wt Readings from Last 10 Encounters:  08/12/12 112 lb 10.5 oz (51.1 kg)  08/12/12 112 lb 10.5 oz (51.1 kg)  08/05/12 118 lb 12.8 oz (53.887 kg)  07/29/12 117 lb (53.071 kg)  07/09/12 119 lb 11.2 oz (54.296 kg)  07/08/12 115 lb 11.9 oz (52.5 kg)  07/08/12 115 lb 11.9 oz (52.5 kg)  07/07/12 120 lb (54.432 kg)    Usual Body Weight:   Unable to assess, 120 lbs in Jan 2014  % Usual Body Weight: 93%  BMI:  Body mass index is 17.13 kg/(m^2).  Estimated Nutritional Needs: Kcal: 1750-1960 Protein: 85-98g Fluid: >1.5 L/day  Skin: wnl  Diet Order: NPO  EDUCATION NEEDS: -Education not appropriate at this time   Intake/Output Summary (Last 24 hours) at 08/13/12 1032 Last data filed at 08/13/12 1000  Gross per 24 hour  Intake   1500 ml  Output    151 ml  Net   1349 ml    Last BM: 3/6  Labs:   Recent Labs Lab 08/12/12 2339  NA 135  K 3.9  CL 98  CO2 25  BUN 18  CREATININE 1.09  CALCIUM 9.5  GLUCOSE 129*    CBG (last 3)  No results found for this basename: GLUCAP,  in the last 72 hours  Scheduled Meds:   Continuous Infusions: . sodium chloride 75 mL/hr at 08/12/12 2345    Past Medical History  Diagnosis Date  . TB (pulmonary tuberculosis) 1998  . Learning disabilities   . Cough   . Difficulty swallowing   . Pain on swallowing   . Abnormal weight loss documented 07/07/12     20 lbs  . Referred otalgia     Right Ear  . 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 Arthritis  Past Surgical History  Procedure Laterality Date  . Colonoscopy  2006    neg.   . Direct laryngoscopy  07/09/11    Epiglottic mass  . Direct laryngoscopy  07/08/2012    Procedure: DIRECT LARYNGOSCOPY;  Surgeon: Serena Colonel, MD;  Location: [facility] OR;  Service: ENT;  Laterality: N/A;  . Esophagoscopy  07/08/2012    Procedure: ESOPHAGOSCOPY;  Surgeon: Serena Colonel, MD;  Location: [facility] OR;  Service: ENT;  Laterality: N/A;    Loyce Dys, MS RD LDN Clinical Inpatient Dietitian Pager: [phone] Weekend/After hours pager: [phone]

## 2012-08-13 NOTE — Anesthesia Postprocedure Evaluation (Signed)
  Anesthesia Post-op Note  Patient: Cody [name]  Procedure(s) Performed: Procedure(s): RADICAL NECK DISSECTION (Bilateral) TRACHEOSTOMY (N/A) RIGHT PECTORALIS MYOCUTANEOUS FLAP to LARYNX (N/A)  Patient Location: PACU  Anesthesia Type:General  Level of Consciousness: awake  Airway and Oxygen Therapy: Patient Spontanous Breathing and Patient connected to tracheostomy mask oxygen  Post-op Pain: mild  Post-op Assessment: Post-op Vital signs reviewed, Patient's Cardiovascular Status Stable, Respiratory Function Stable, Patent Airway and No signs of Nausea or vomiting  Post-op Vital Signs: Reviewed and stable  Complications: No apparent anesthesia complications

## 2012-08-13 NOTE — Anesthesia Preprocedure Evaluation (Addendum)
Anesthesia Evaluation  Patient identified by MRN, date of birth, ID band Patient awake    Reviewed: Allergy & Precautions, H&P , NPO status , Patient's Chart, lab work & pertinent test results, reviewed documented beta blocker date and time   Airway Mallampati: II TM Distance: <3 FB Neck ROM: Limited    Dental  (+) Poor Dentition   Pulmonary COPDformer smoker,  TB hx + rhonchi         Cardiovascular Rhythm:Regular Rate:Normal     Neuro/Psych Learning disability   GI/Hepatic   Endo/Other    Renal/GU      Musculoskeletal   Abdominal   Peds  Hematology   Anesthesia Other Findings   Reproductive/Obstetrics                        Anesthesia Physical Anesthesia Plan  ASA: III  Anesthesia Plan: General   Post-op Pain Management:    Induction: Intravenous  Airway Management Planned: Oral ETT and Tracheostomy  Additional Equipment:   Intra-op Plan:   Post-operative Plan: Extubation in OR  Informed Consent: I have reviewed the patients History and Physical, chart, labs and discussed the procedure including the risks, benefits and alternatives for the proposed anesthesia with the patient or authorized representative who has indicated his/her understanding and acceptance.     Plan Discussed with: CRNA and Surgeon  Anesthesia Plan Comments:         Anesthesia Quick Evaluation

## 2012-08-13 NOTE — Clinical Documentation Improvement (Signed)
MALNUTRITION DOCUMENTATION CLARIFICATION  THIS DOCUMENT IS NOT A PERMANENT PART OF THE MEDICAL RECORD  TO RESPOND TO THE THIS QUERY, FOLLOW THE INSTRUCTIONS BELOW:  1. If needed, update documentation for the patient's encounter via the notes activity.  2. Access this query again and click edit on the In [employer].  3. After updating, or not, click F2 to complete all highlighted (required) fields concerning your review. Select "additional documentation in the medical record" OR "no additional documentation provided".  4. Click Sign note button.  5. The deficiency will fall out of your In Basket *Please let us know if you are not able to complete this workflow by phone or e-mail (listed below).  Please update your documentation within the medical record to reflect your response to this query.                                                                                        08/13/12   Dear [facility],  In a better effort to capture your patient's severity of illness, reflect appropriate length of stay and utilization of resources, a review of the patient medical record has revealed the following indicators.    Based on your clinical judgment, please clarify and document in a progress note and/or discharge summary the clinical condition associated with the following supporting information:  In responding to this query please exercise your independent judgment.  The fact that a query is asked, does not imply that any particular answer is desired or expected.  Noted in notes "unintentional weight loss" nutrition consult prior to admit BMI 18.2/ per anesthesia note 17.2 please document all following clinical conditions that are appropriate.  Thank you  Possible Clinical Conditions?  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 BMI:  17.2  Weight  Loss___15lbs   You may use possible, probable, or suspect with inpatient documentation. possible, probable, suspected diagnoses MUST be documented at the time of discharge  Reviewed malnourished nt 3/10 Rosen   Thank You,  Lavonda Jumbo  Clinical Documentation Specialist, RN, BSN: Pager [phone]  Health Information Management [place]

## 2012-08-13 NOTE — Preoperative (Signed)
Beta Blockers   Reason not to administer Beta Blockers:Not Applicable 

## 2012-08-13 NOTE — Op Note (Signed)
OPERATIVE REPORT  DATE OF SURGERY: 08/13/2012  PATIENT:  Cody [name],  67 y.o. male  PRE-OPERATIVE DIAGNOSIS:  laryngeal cancer, complex pharyngeal wound, CHRONIC PERIDONTIS   POST-OPERATIVE DIAGNOSIS:  laryngeal cancer, complex pharyngeal wound, CHRONIC PERIDONTIS   PROCEDURE:  Procedure(s): Bilateral modified neck dissection, total laryngectomy. RIGHT PECTORALIS MYOCUTANEOUS FLAP to LARYNX  SURGEON:  Susy Frizzle, MD  ASSISTANTS: Dr. Emeline Darling  ANESTHESIA:   General   EBL:  300 ml  DRAINS: 19 French JP drains, 2 in the neck, 2 in the chest  LOCAL MEDICATIONS USED:  None  SPECIMEN:  Total laryngectomy and bilateral neck dissection. Additional margin of vallecular for frozen section.  COUNTS:  Correct  PROCEDURE DETAILS: The patient was taken to the operating room and placed on the operating table in the supine position. Following induction of general endotracheal anesthesia the neck and chest were prepped and draped in a standard fashion. An apron incision was outlined with a marking pen with a separate circular incision for the proposed stoma. Electrocautery was used to incise the skin and subcutaneous tissue. The platysma layer was separated and subplatysmal flaps were developed superiorly to the angle of the mandible on both sides and inferiorly to the clavicle and encompassing the stomal incision.  Bilateral level 2/3/4 neck dissection for accomplished. There were multiple suspicious nodes on both sides. On the right side the largest was about 6 cm and was adherent to the internal jugular vein. The vein was sacrificed on the right side. On the left side the largest was in level IV and was also adjacent to the internal jugular vein but the vein was preserved. The hypoglossal and spinal accessory nerves were preserved bilaterally. The fibrofatty tissue and lymph node bearing tissue was dissected on both sides from the levels 2 through 4 regions. Superiorly, the tissue posterior and  superior to the sensory nerve was dissected down to the splenius capitis and levator scapulae muscles. Posteriorly the dissection encompassed the fascia overlying the cutaneous branches of the cervical plexus and down towards the phrenic nerves. If early on the left there were some lymphatics that were ligated between clamps and divided. The internal jugular vein was stripped free of fibrofatty tissue on the left and preserved. The carotid system and vagus were preserved bilaterally. The specimens were kept en-bloc with thelarynx.  The larynx was skeletonized. The tracheal stoma was created just below the first ring and this was secured to the circular skin incision. As the party wall was divided the larynx was brought superiorly. The subglottic larynx was clear. The larynx was entered on the left side of the piriform sinus. Mucosa was visualized while the mucosal cuts were then accomplished. Significant mucosa was preserved on the left but there is significant lateral pharyngeal wall involvement on the right. Using direct visualization the laryngeal specimen was removed. Adequate margins were felt to be present throughout except at the vallecula. Additional vallecular margin of about a little over centimeter was taken and sent for frozen section analysis. There was carcinoma in the additional section but the new margin was clear. The remaining pharyngeal mucosa was felt to be adequate for closure and therefore the assistance of Dr. Odis Luster was requested for pectoralis major myocutaneous flap reconstruction. After the pharyngeal defect was closed using the skin of the pectoralis flap, the 2 drains were placed, one on either side. Adequacy of closure was assessed using an Asepto syringe and saline. It appeared to be watertight. A 36 bougie dilator was kept in  the esophagus during the closure.the platysma layer was reapproximated with interrupted chromic suture. Staples were used on the skin. The drains were secured  in place using nylon suture. The stoma was completed with interrupted Vicryl suture and then running plain gut.the patient was then awakened, and transferred to recovery in stable condition.    PATIENT DISPOSITION:  To PACU, stable

## 2012-08-13 NOTE — Transfer of Care (Signed)
Immediate Anesthesia Transfer of Care Note  Patient: Cody [name]  Procedure(s) Performed: Procedure(s): RADICAL NECK DISSECTION (Bilateral) TRACHEOSTOMY (N/A) RIGHT PECTORALIS MYOCUTANEOUS FLAP to LARYNX (N/A)  Patient Location: PACU  Anesthesia Type:General  Level of Consciousness: awake and patient cooperative  Airway & Oxygen Therapy: Patient Spontanous Breathing and Patient connected to tracheostomy mask oxygen  Post-op Assessment: Report given to PACU RN and Post -op Vital signs reviewed and stable  Post vital signs: Reviewed and stable  Complications: No apparent anesthesia complications

## 2012-08-13 NOTE — Anesthesia Procedure Notes (Signed)
Procedure Name: Intubation Date/Time: 08/13/2012 7:59 AM Performed by: Darcey Nora B Pre-anesthesia Checklist: Patient identified, Emergency Drugs available, Suction available and Patient being monitored Patient Re-evaluated:Patient Re-evaluated prior to inductionOxygen Delivery Method: Circle system utilized Preoxygenation: Pre-oxygenation with 100% oxygen Intubation Type: IV induction Ventilation: Mask ventilation without difficulty Laryngoscope Size: Mac and 3 Grade View: Grade II Tube type: Oral Tube size: 7.5 mm Number of attempts: 1 Airway Equipment and Method: Video-laryngoscopy and Stylet Placement Confirmation: ETT inserted through vocal cords under direct vision,  breath sounds checked- equal and bilateral and positive ETCO2 Secured at: 23 (cm at teeth) cm Tube secured with: Tape Dental Injury: Teeth and Oropharynx as per pre-operative assessment

## 2012-08-14 LAB — COMPREHENSIVE METABOLIC PANEL
ALT: 8 U/L (ref 0–53)
Albumin: 2.6 g/dL — ABNORMAL LOW (ref 3.5–5.2)
Alkaline Phosphatase: 46 U/L (ref 39–117)
Calcium: 7.5 mg/dL — ABNORMAL LOW (ref 8.4–10.5)
GFR calc Af Amer: >90 mL/min (ref >90)
Glucose, Bld: 133 mg/dL — ABNORMAL HIGH (ref 70–99)
Potassium: 3.9 mEq/L (ref 3.5–5.1)
Sodium: 134 mEq/L — ABNORMAL LOW (ref 135–145)
Total Protein: 6.2 g/dL (ref 6.0–8.3)

## 2012-08-14 LAB — CBC
Hemoglobin: 9.4 g/dL — ABNORMAL LOW (ref 13.0–17.0)
MCH: 31.5 pg (ref 26.0–34.0)
MCHC: 35.2 g/dL (ref 30.0–36.0)
Platelets: 140 10*3/uL — ABNORMAL LOW (ref 150–400)
RDW: 13.7 % (ref 11.5–15.5)

## 2012-08-14 MED ORDER — JEVITY 1.2 CAL PO LIQD
1000.0000 mL | ORAL | Status: DC
  Administered 2012-08-14: 1000 mL
  Filled 2012-08-14: qty 1000

## 2012-08-14 MED ORDER — SODIUM CHLORIDE 0.9 % IV SOLN
1.0000 g | Freq: Once | INTRAVENOUS | Status: AC
  Administered 2012-08-14: 1 g via INTRAVENOUS
  Filled 2012-08-14: qty 10

## 2012-08-14 NOTE — Progress Notes (Signed)
   ENT Progress Note: POD #1  s/p Procedure(s): RADICAL NECK DISSECTION TRACHEOSTOMY RIGHT PECTORALIS MYOCUTANEOUS FLAP to LARYNX   Subjective: Stable post op, no complaints  Objective: Vital signs in last 24 hours: Temp:  [95.7 F (35.4 C)-99.7 F (37.6 C)] 99.2 F (37.3 C) (03/08 0400) Pulse Rate:  [61-95] 66 (03/08 0846) Resp:  [11-23] 14 (03/08 0846) BP: (98-137)/(51-73) 100/52 mmHg (03/08 0846) SpO2:  [100 %] 100 % (03/08 0846) FiO2 (%):  [28 %-40 %] 28 % (03/08 0846) Weight:  [57.9 kg (127 lb 10.3 oz)] 57.9 kg (127 lb 10.3 oz) (03/07 1820) Weight change: 6.8 kg (14 lb 15.9 oz) Last BM Date: 08/12/12  Intake/Output from previous day: 03/07 0701 - 03/08 0700 In: 5841.3 [I.V.:5041.3; IV Piggyback:800] Out: 2775 [Urine:2205; Drains:360; Blood:210] Intake/Output this shift:    Labs:  Recent Labs  08/12/12 2339 08/14/12 0435  WBC 5.8 8.9  HGB 14.5 9.4*  HCT 41.1 26.7*  PLT 174 140*    Recent Labs  08/12/12 2339 08/14/12 0435  NA 135 134*  K 3.9 3.9  CL 98 101  CO2 25 26  GLUCOSE 129* 133*  BUN 18 7  CALCIUM 9.5 7.5*    Studies/Results: No results found.   PHYSICAL EXAM: Inc stable Stoma intact, min crusting JP's functional with ~80cc per Chest - CTA   Assessment/Plan: Pt stable on post op day 1 OOB to chair Start TF protocol Supplement Ca   SHOEMAKER, DAVID 08/14/2012, 11:08 AM

## 2012-08-15 LAB — BASIC METABOLIC PANEL
BUN: 7 mg/dL (ref 6–23)
Calcium: 7.4 mg/dL — ABNORMAL LOW (ref 8.4–10.5)
GFR calc Af Amer: >90 mL/min (ref >90)
GFR calc non Af Amer: 89 mL/min — ABNORMAL LOW (ref >90)
Glucose, Bld: 103 mg/dL — ABNORMAL HIGH (ref 70–99)
Potassium: 3.5 mEq/L (ref 3.5–5.1)
Sodium: 139 mEq/L (ref 135–145)

## 2012-08-15 MED ORDER — SODIUM CHLORIDE 0.9 % IV SOLN
2.0000 g | Freq: Once | INTRAVENOUS | Status: AC
  Administered 2012-08-15: 2 g via INTRAVENOUS
  Filled 2012-08-15: qty 20

## 2012-08-15 MED ORDER — JEVITY 1.2 CAL PO LIQD
1000.0000 mL | ORAL | Status: DC
  Administered 2012-08-15: 15:00:00
  Administered 2012-08-16: 1000 mL
  Filled 2012-08-15 (×4): qty 1000

## 2012-08-15 MED ORDER — POTASSIUM CHLORIDE 20 MEQ/15ML (10%) PO LIQD
20.0000 meq | ORAL | Status: AC
  Administered 2012-08-15 (×2): 20 meq
  Filled 2012-08-15 (×2): qty 15

## 2012-08-15 MED ORDER — ACETAMINOPHEN 160 MG/5ML PO SOLN
650.0000 mg | Freq: Four times a day (QID) | ORAL | Status: DC | PRN
  Administered 2012-08-15 – 2012-08-16 (×4): 650 mg
  Filled 2012-08-15 (×4): qty 20.3

## 2012-08-15 NOTE — Progress Notes (Signed)
   ENT Progress Note: POD #2 s/p Procedure(s): RADICAL NECK DISSECTION TRACHEOSTOMY RIGHT PECTORALIS MYOCUTANEOUS FLAP to LARYNX   Subjective: Pt stable, c/o H/A  Objective: Vital signs in last 24 hours: Temp:  [98.5 F (36.9 C)-100.7 F (38.2 C)] 98.5 F (36.9 C) (03/09 0400) Pulse Rate:  [67-93] 82 (03/09 1140) Resp:  [10-20] 16 (03/09 1140) BP: (84-118)/(47-67) 84/48 mmHg (03/09 0805) SpO2:  [97 %-100 %] 100 % (03/09 1140) FiO2 (%):  [21 %-28 %] 21 % (03/09 1140) Weight:  [55.7 kg (122 lb 12.7 oz)] 55.7 kg (122 lb 12.7 oz) (03/09 0600) Weight change: -2.2 kg (-4 lb 13.6 oz) Last BM Date: 08/12/12  Intake/Output from previous day: 03/08 0701 - 03/09 0700 In: 2670 [I.V.:1500; NG/GT:770; IV Piggyback:400] Out: 1670 [Urine:1520; Drains:150] Intake/Output this shift:    Labs:  Recent Labs  08/12/12 2339 08/14/12 0435  WBC 5.8 8.9  HGB 14.5 9.4*  HCT 41.1 26.7*  PLT 174 140*    Recent Labs  08/14/12 0435 08/15/12 0426  NA 134* 139  K 3.9 3.5  CL 101 103  CO2 26 29  GLUCOSE 133* 103*  BUN 7 7  CALCIUM 7.5* 7.4*    Studies/Results: No results found.   PHYSICAL EXAM: Inc intact, expected edema at flap site JP output decreasing, 150cc for 24 hrs Stoma intact with min crusting tol NG tube feeds    Assessment/Plan: Pt stable, OOB in chair Cont ICU monitoring Supplement Ca levels and monitor    SHOEMAKER, DAVID 08/15/2012, 12:10 PM

## 2012-08-15 NOTE — Progress Notes (Signed)
Subjective: Headache. Apparently good pain control.  Objective: Vital signs in last 24 hours: Temp:  [98.5 F (36.9 C)-100.7 F (38.2 C)] 98.5 F (36.9 C) (03/09 0400) Pulse Rate:  [65-93] 77 (03/09 0805) Resp:  [10-20] 14 (03/09 0805) BP: (84-118)/(47-67) 84/48 mmHg (03/09 0805) SpO2:  [97 %-100 %] 100 % (03/09 0805) FiO2 (%):  [21 %-28 %] 21 % (03/09 0805) Weight:  [122 lb 12.7 oz (55.7 kg)] 122 lb 12.7 oz (55.7 kg) (03/09 0600)  Intake/Output from previous day: 03/08 0701 - 03/09 0700 In: 2670 [I.V.:1500; NG/GT:770; IV Piggyback:400] Out: 1670 [Urine:1520; Drains:150] Intake/Output this shift:    Operative sites: Donor site right chest looks good. No evidence of bleeding or infection. Drains functioning. Drainage thin.   Recent Labs  08/12/12 2339 08/14/12 0435 08/15/12 0426  WBC 5.8 8.9  --   HGB 14.5 9.4*  --   HCT 41.1 26.7*  --   NA 135 134* 139  K 3.9 3.9 3.5  CL 98 101 103  CO2 25 26 29   BUN 18 7 7   CREATININE 1.09 0.95 0.86    Studies/Results: No results found.  Assessment/Plan: Will order Tylenol for headache. LFTs are OK. He seems to be making good progress at this point.   LOS: 3 days    Cody [name], Cody [name] 08/15/2012 10:38 AM

## 2012-08-15 NOTE — Progress Notes (Signed)
[facility] ADULT ICU REPLACEMENT PROTOCOL FOR AM LAB REPLACEMENT ONLY  The patient does apply for the [facility] Adult ICU Electrolyte Replacment Protocol based on the criteria listed below:   1. Is GFR >/= 40 ml/min? yes  Patient's GFR today is >90 2. Is urine output >/= 0.5 ml/kg/hr for the last 6 hours? yes Patient's UOP is 1.2 ml/kg/hr 3. Is BUN < 60 mg/dL? yes  Patient's BUN today is 7 4. Abnormal electrolyte(s): k3.5 5. Ordered repletion with:   Melrose Nakayama 08/15/2012 6:19 AM

## 2012-08-15 NOTE — Progress Notes (Signed)
Changed patient over to Room [facility] Tolerating well at this time.

## 2012-08-16 ENCOUNTER — Other Ambulatory Visit (HOSPITAL_COMMUNITY): Payer: Self-pay | Admitting: Dentistry

## 2012-08-16 ENCOUNTER — Encounter (HOSPITAL_COMMUNITY): Payer: Self-pay | Admitting: Otolaryngology

## 2012-08-16 LAB — BASIC METABOLIC PANEL
BUN: 7 mg/dL (ref 6–23)
Chloride: 106 mEq/L (ref 96–112)
Creatinine, Ser: 0.69 mg/dL (ref 0.50–1.35)
GFR calc Af Amer: >90 mL/min (ref >90)
GFR calc non Af Amer: >90 mL/min (ref >90)

## 2012-08-16 MED ORDER — PRO-STAT SUGAR FREE PO LIQD
30.0000 mL | Freq: Every morning | ORAL | Status: AC
  Administered 2012-08-17: 30 mL
  Filled 2012-08-16: qty 30

## 2012-08-16 MED ORDER — POTASSIUM CHLORIDE 20 MEQ/15ML (10%) PO LIQD
20.0000 meq | ORAL | Status: DC
  Administered 2012-08-16: 20 meq
  Filled 2012-08-16: qty 15

## 2012-08-16 MED ORDER — CEFAZOLIN SODIUM-DEXTROSE 2-3 GM-% IV SOLR: 2.0000 g | INTRAVENOUS | Status: DC

## 2012-08-16 MED ORDER — JEVITY 1.2 CAL PO LIQD
1000.0000 mL | ORAL | Status: DC
  Filled 2012-08-16 (×2): qty 1000

## 2012-08-16 MED ORDER — BIOTENE DRY MOUTH MT LIQD
15.0000 mL | Freq: Two times a day (BID) | OROMUCOSAL | Status: DC
  Administered 2012-08-17 – 2012-08-19 (×5): 15 mL via OROMUCOSAL

## 2012-08-16 MED ORDER — IBUPROFEN 100 MG/5ML PO SUSP
600.0000 mg | Freq: Four times a day (QID) | ORAL | Status: DC | PRN
  Filled 2012-08-16 (×2): qty 30

## 2012-08-16 MED ORDER — POTASSIUM CHLORIDE 20 MEQ/15ML (10%) PO LIQD
20.0000 meq | ORAL | Status: DC
  Administered 2012-08-16 – 2012-08-17 (×7): 20 meq
  Filled 2012-08-16 (×12): qty 15

## 2012-08-16 MED ORDER — HYDROCODONE-ACETAMINOPHEN 7.5-325 MG/15ML PO SOLN
15.0000 mL | ORAL | Status: DC | PRN
  Administered 2012-08-16 – 2012-08-17 (×5): 15 mL
  Filled 2012-08-16 (×5): qty 15

## 2012-08-16 MED ORDER — CHLORHEXIDINE GLUCONATE 0.12 % MT SOLN
15.0000 mL | Freq: Two times a day (BID) | OROMUCOSAL | Status: DC
  Administered 2012-08-16 – 2012-08-24 (×15): 15 mL via OROMUCOSAL
  Filled 2012-08-16 (×13): qty 15

## 2012-08-16 MED ORDER — JEVITY 1.2 CAL PO LIQD
360.0000 mL | Freq: Four times a day (QID) | ORAL | Status: DC
  Administered 2012-08-16: 21:00:00
  Administered 2012-08-17 – 2012-08-18 (×9): 360 mL
  Filled 2012-08-16 (×16): qty 474

## 2012-08-16 NOTE — Progress Notes (Signed)
Patient was malnourished on admission, as he had lost a significant amount of weight prior to his cancer diagnosis. He is currently undergoing nutritional support via NG feeding tube while his pharynx heals from surgery.

## 2012-08-16 NOTE — Progress Notes (Signed)
[facility] ADULT ICU REPLACEMENT PROTOCOL FOR AM LAB REPLACEMENT ONLY  The patient does apply for the [facility] Adult ICU Electrolyte Replacment Protocol based on the criteria listed below:   1. Is GFR >/= 40 ml/min? yes  Patient's GFR today is >90 2. Is urine output >/= 0.5 ml/kg/hr for the last 6 hours? yes Patient's UOP is 0.9 ml/kg/hr 3. Is BUN < 60 mg/dL? yes  Patient's BUN today is 7 4. Abnormal electrolyte(s):K3.3 5. Ordered repletion with:   Cody [name] 08/16/2012 6:37 AM

## 2012-08-16 NOTE — Op Note (Signed)
Cody [name], Cody [name] NO.:  [id number]  MEDICAL RECORD NO.:  [id number]  LOCATION:                                 FACILITY:  PHYSICIAN:  Etter Sjogren, M.D.     DATE OF BIRTH:  04-Dec-1945  DATE OF PROCEDURE:  08/13/2012 DATE OF DISCHARGE:                              OPERATIVE REPORT   PREOPERATIVE DIAGNOSIS:  Laryngeal cancer.  POSTOPERATIVE DIAGNOSIS:  Laryngeal cancer.  PROCEDURE PERFORMED:  Right pectoralis myocutaneous flap.  SURGEON:  Etter Sjogren, M.D.  ASSISTANTEnrigue Catena H. Pollyann Kennedy, MD  ESTIMATED BLOOD LOSS:  10 mL.  DRAINS:  Two 19-French.  CLINICAL NOTE:  A 67 year old man has laryngeal cancer and is having a resection and referred for evaluation for flap closure.  Next, procedure, risks plus complications were discussed with him and his family.  These risks include, but not limited to, bleeding, infection, healing problems, scarring, loss of sensation, fluid accumulations, loss of range of motion and strength of the right arm and shoulder, healing problems, pneumothorax, pulmonary embolism, damage to deeper structures including arteries, veins and nerves.  Also understood that there were risks of contour deformities, and overall disappointment as well as chronic pain.  He understood all this and wished to proceed.  DESCRIPTION OF PROCEDURE:  The patient was in the operating room, and the ablated procedure had been performed.  The skin paddle was then designed as a S-shaped incision that was began more or less vertically and then extended horizontally underneath the nipple-areolar complex. The incisions were made.  The skin paddle was then isolated beveling outward both medial and lateral, and superior in order to ensure a broader attachment soft tissues at the level of the muscle in order to ensure blood supply to the skin paddle.  Dissection was then carried lateral and medial exposing the full extent of the pectoralis muscle. The muscle was  identified at its insertion at the right shoulder and it was divided there under direct vision.  The muscle was then taken off its origin inferior and medial, and again meticulous hemostasis maintaining his electrocautery.  The muscle was then reflected cephalad and the thoracoacromial vessels were identified and were carefully preserved.  Subcutaneous tunnel was then made to the neck and the muscle myocutaneous flap then passed gently through this tunnel to the laryngeal defect.  The muscle flap was found to be under no tension whatsoever.  The pedicles inspected, no tension there either.  The defect irrigated thoroughly with saline and meticulous hemostasis with electrocautery.  Two 19-French drains were positioned, brought through separate stab wounds inferolaterally and secured with 3-0 Prolene sutures.  Great care was taken to make sure that these drains were not in the vicinity of the thoracoacromial vessels.  The closure was then with 2-0 and 3-0 Vicryl inverted deep sutures and skin staples.  The drains were dressed with a Biopatch and Tegaderm dressing.  He tolerated this portion of the procedure very well.     Etter Sjogren, M.D.     DB/MEDQ  D:  08/13/2012  T:  08/14/2012  Job:  [id number]

## 2012-08-16 NOTE — Progress Notes (Signed)
Patient transferred to 6N28 by chair.  Patient repositioned in bed, 02 trach collar on, tube feeding infusing.  Patient oriented to room and call light.  Vital signs stable, no complaints of discomfort.  Respiratory here to check 02 settings.  Family at bedside, will continue to monitor.

## 2012-08-16 NOTE — Progress Notes (Signed)
UR Completed.  Cody [name] Jane [phone] 08/16/2012  

## 2012-08-16 NOTE — Progress Notes (Signed)
Subjective: No complaints. He is breathing well, pain is controlled, tolerating tube feeds, moving bowels.  Objective: Vital signs in last 24 hours: Temp:  [98.3 F (36.8 C)-98.9 F (37.2 C)] 98.3 F (36.8 C) (03/10 0827) Pulse Rate:  [65-90] 78 (03/10 0805) Resp:  [12-20] 19 (03/10 0805) BP: (96-149)/(51-74) 149/65 mmHg (03/10 0600) SpO2:  [99 %-100 %] 100 % (03/10 0805) FiO2 (%):  [21 %] 21 % (03/10 0805) Weight change:  Last BM Date: 08/12/12  Intake/Output from previous day: 03/09 0701 - 03/10 0700 In: 3900 [I.V.:1575; NG/GT:1805; IV Piggyback:520] Out: 1448.5 [Urine:940; Drains:182.5; Stool:326] Intake/Output this shift:    PHYSICAL EXAM: Stoma patent and clear, no crusting. All JP's with diminishing output, all looks serous now. No signs of mucus. Normal post op flap edema.   Lab Results:  Recent Labs  08/14/12 0435  WBC 8.9  HGB 9.4*  HCT 26.7*  PLT 140*   BMET  Recent Labs  08/15/12 0426 08/16/12 0446  NA 139 140  K 3.5 3.3*  CL 103 106  CO2 29 29  GLUCOSE 103* 123*  BUN 7 7  CREATININE 0.86 0.69  CALCIUM 7.4* 7.9*    Studies/Results: No results found.  Medications: I have reviewed the patient's current medications.  Assessment/Plan: Stable post op. Progressing nicely. Will transfer out of ICU today. Advance to bolus feeds. SLP eval for electrolarynx. Ambulate, d/c foley cath. Continue to monitor and replace calcium.   LOS: 4 days   ROSEN, JEFRY 08/16/2012, 8:42 AM

## 2012-08-16 NOTE — Progress Notes (Signed)
NUTRITION FOLLOW UP  Intervention:   Change to bolus regimen as follows:  Jevity 1.2 to 360 ml QID. Start with 120 ml bolus and increase each feeding by 60 ml until reaching goal of 360 ml QID.   Add 30 ml Prostat by tube daily.   This TF regimen provides 1828 kcal, 95 grams of protein and 1162 ml free water.   Nutrition Dx:   Inadequate oral intake now related to inability to eat as evidenced by NPO status.   Goal:   EN to meet >/=90% estimated needs.  Monitor:   Toleration of enteral nutrition, weight trends   Assessment:   Pt admitted with laryngeal mass, cervical adenopathy and poor dentition. Pt s/p total laryngectomy with neck dissection and total dental extraction. Pt for eventual radiation therapy.   Pt with 15 lbs wt loss over the past 4 months per chart review. Per MD note 08/16/2012, pt was malnourished on admission and had lost a significant amount of weight prior to his cancer diagnosis. RD for nutrition support via NG feeding tube while pharynx heals from surgery.   Pt currently receiving Jevity 1.2 @ 75 ml/hr providing 2160 kcal,100 grams of protein and 1453 ml of free water. Per MD note, pt is tolerating tube feeds and moving bowels.   Height: Ht Readings from Last 1 Encounters:  08/13/12 5\' 8"  (1.727 m)    Weight Status:   Wt Readings from Last 10 Encounters:  08/15/12 122 lb 12.7 oz (55.7 kg)  08/15/12 122 lb 12.7 oz (55.7 kg)  08/05/12 118 lb 12.8 oz (53.887 kg)  07/29/12 117 lb (53.071 kg)  07/09/12 119 lb 11.2 oz (54.296 kg)  07/08/12 115 lb 11.9 oz (52.5 kg)  07/08/12 115 lb 11.9 oz (52.5 kg)  07/07/12 120 lb (54.432 kg)    Body mass index is 18.68 kg/(m^2). WNL  Re-estimated needs:  Kcal: 1750-1950  Protein: 85-100 g Fluid: 1.7 - 1.9 L   Skin: incision & drains - neck   Diet Order:  NPO   Intake/Output Summary (Last 24 hours) at 08/16/12 1426 Last data filed at 08/16/12 1400  Gross per 24 hour  Intake   3810 ml  Output 2278.5 ml  Net  1531.5 ml    Last BM: 08/16/2012   Labs:   Recent Labs Lab 08/14/12 0435 08/15/12 0426 08/16/12 0446  NA 134* 139 140  K 3.9 3.5 3.3*  CL 101 103 106  CO2 26 29 29   BUN 7 7 7   CREATININE 0.95 0.86 0.69  CALCIUM 7.5* 7.4* 7.9*  GLUCOSE 133* 103* 123*    CBG (last 3)   Recent Labs  08/13/12 1817  GLUCAP 115*    Scheduled Meds: . ampicillin-sulbactam (UNASYN) IV  3 g Intravenous Q6H  . bacitracin  1 application Topical Q8H  . potassium chloride  20 mEq Per Tube Q4H    Continuous Infusions: . sodium chloride Stopped (08/13/12 1956)  . dextrose 5 % and 0.9% NaCl 75 mL/hr at 08/16/12 0300  . feeding supplement (JEVITY 1.2 CAL) 1,000 mL (08/16/12 0615)    Belenda Cruise  Dietetic Intern Pager: [phone]

## 2012-08-17 DIAGNOSIS — Z0189 Encounter for other specified special examinations: Secondary | ICD-10-CM

## 2012-08-17 DIAGNOSIS — C329 Malignant neoplasm of larynx, unspecified: Secondary | ICD-10-CM

## 2012-08-17 LAB — BASIC METABOLIC PANEL
BUN: 7 mg/dL (ref 6–23)
CO2: 30 mEq/L (ref 19–32)
Chloride: 104 mEq/L (ref 96–112)
Creatinine, Ser: 0.75 mg/dL (ref 0.50–1.35)
GFR calc Af Amer: >90 mL/min (ref >90)
Glucose, Bld: 108 mg/dL — ABNORMAL HIGH (ref 70–99)
Potassium: 4.3 mEq/L (ref 3.5–5.1)

## 2012-08-17 LAB — GLUCOSE, CAPILLARY
Glucose-Capillary: 110 mg/dL — ABNORMAL HIGH (ref 70–99)
Glucose-Capillary: 106 mg/dL — ABNORMAL HIGH (ref 70–99)

## 2012-08-17 MED ORDER — ONDANSETRON HCL 4 MG/2ML IJ SOLN
4.0000 mg | Freq: Four times a day (QID) | INTRAMUSCULAR | Status: DC | PRN
Start: 2012-08-17 — End: 2012-08-22

## 2012-08-17 MED ORDER — WHITE PETROLATUM GEL
Status: AC
  Filled 2012-08-17: qty 5

## 2012-08-17 MED ORDER — MORPHINE SULFATE 2 MG/ML IJ SOLN
1.0000 mg | INTRAMUSCULAR | Status: DC | PRN
  Administered 2012-08-17 – 2012-08-19 (×7): 1 mg via INTRAVENOUS
  Filled 2012-08-17 (×10): qty 1

## 2012-08-17 MED ORDER — ACETAMINOPHEN 160 MG/5ML PO SOLN
500.0000 mg | Freq: Four times a day (QID) | ORAL | Status: DC | PRN
  Administered 2012-08-20: 500 mg
  Filled 2012-08-17: qty 20.3

## 2012-08-17 NOTE — Progress Notes (Signed)
Subjective: No new complaints. Having positional breathing trouble, relieved with extending his neck a little.Tolerating tube feeds, voiding and regular BM's.  Objective: Vital signs in last 24 hours: Temp:  [97.8 F (36.6 C)-99.4 F (37.4 C)] 98.3 F (36.8 C) (03/11 0614) Pulse Rate:  [72-84] 73 (03/11 0900) Resp:  [12-20] 16 (03/11 0900) BP: (101-135)/(56-67) 101/60 mmHg (03/11 0614) SpO2:  [100 %] 100 % (03/11 0900) FiO2 (%):  [21 %-28 %] 28 % (03/11 0900) Weight change:  Last BM Date: 08/17/12  Intake/Output from previous day: 03/10 0701 - 03/11 0700 In: 1758 [I.V.:793; NG/GT:765; IV Piggyback:200] Out: 1552 [Urine:880; Drains:122; Stool:550] Intake/Output this shift:    PHYSICAL EXAM: Stoma cleaned of crusts. Healthy mucosa, flap partially obstructs soma when his neck is flexed. Nice and patent when he is extended slightly. Drains with serous drainage, no saliva. Flaps look healthy.  Lab Results: No results found for this basename: WBC, HGB, HCT, PLT,  in the last 72 hours BMET  Recent Labs  08/16/12 0446 08/17/12 0625  NA 140 138  K 3.3* 4.3  CL 106 104  CO2 29 30  GLUCOSE 123* 108*  BUN 7 7  CREATININE 0.69 0.75  CALCIUM 7.9* 8.2*    Studies/Results: No results found.  Medications: I have reviewed the patient's current medications.  Assessment/Plan: Progressing nicely. Continue to monitor drain output. Keep stoma clean. Will stop potassium supplements and recheck. Calcium also rising again. PT consult.  LOS: 5 days   ROSEN, JEFRY 08/17/2012, 11:56 AM

## 2012-08-17 NOTE — Care Management Note (Signed)
  Page 2 of 2   08/18/2012     3:25:44 PM   CARE MANAGEMENT NOTE 08/18/2012  Patient:  Cody [name], Cody [name]   Account Number:  [id number]  Date Initiated:  08/16/2012  Documentation initiated by:  Cody [name]  Subjective/Objective Assessment:   Post op Bilateral modified neck dissection, total laryngectomy.  RIGHT PECTORALIS MYOCUTANEOUS FLAP to LARYNX  - trach     Action/Plan:   Anticipated DC Date:  08/20/2012   Anticipated DC Plan:  HOME W HOME HEALTH SERVICES      DC Planning Services  CM consult      Choice offered to / List presented to:  C-4 Adult Children   DME arranged  SUCTION      DME agency  [facility]     HH arranged  HH-1 RN  HH-2 PT  HH-3 OT  HH-5 SPEECH THERAPY      HH agency  [facility]   Status of service:  In process, will continue to follow Medicare Important Message given?   (If response is "NO", the following Medicare IM given date fields will be blank) Date Medicare IM given:   Date Additional Medicare IM given:    Discharge Disposition:    Per UR Regulation:  Reviewed for med. necessity/level of care/duration of stay  If discussed at Long Length of Stay Meetings, dates discussed:    Comments:  Contacts:  Nemo,Cody [name] Daughter [phone]                   Cody, [name] [phone]    08-18-12 I gave and explained the patient 's part of the forms for his electro larynx, to him and his daughter Cody [name]  [phone]) .  Left them with daughter .  Explained speech will complete their portion and give them to her .   ( Left speech's part on shadow chart) .  Daughter will mail completed form with all other required information.   Cody Flurry RN BSN [phone]   08-18-12 Humified air also ordered for patient . MD mentioned possible need for patient discharging on tube feeds, NGT . Advanced checking to see if tube feeds will be covered by insurance.  Cody Flurry RN BSn [phone]  08-17-12 SLP eval for electrolarynx, placed application for electrolarynx on chart . Year waiting list . Cody Flurry RN BSN [phone]  08-17-12 Spoke with patient and daughter Cody [name] at bedside. Explained [facility] is not in network with [employer] . Cody [name] and patient chose [facility].   Left message with Cody [name] at Dr Lucky Rathke office regarding home health and DME.   Cody Flurry RN BSN [phone]  08-16-12 2:10pm Cody [name], RNBSN [phone] Talked with patient and daughter at bedside.  Patient has trach with trach collar and tube feedings nasally.  Awake alert, nodding head appropriately.  Lives at home with wife who is with him 24/7.  Daughter works in [employer] and son just had back surgery.  Interested in [facility] - would like [facility] - but also interested in a sitter possibly on discharge.  List left in room.   CM will continue to follow.

## 2012-08-17 NOTE — Evaluation (Signed)
Speech Language Pathology Evaluation Patient Details Name: Cody [name] MRN: [id number] DOB: June 14, 1945 Today's Date: 08/17/2012 Time: 4098-1191 SLP Time Calculation (min): 40 min  Problem List:  Patient Active Problem List  Diagnosis  . Carcinoma of supraglottis  . Chronic periodontitis  . Chronic apical periodontitis   Past Medical History:  Past Medical History  Diagnosis Date  . TB (pulmonary tuberculosis) 1998  . Learning disabilities   . Cough   . Difficulty swallowing   . Pain on swallowing   . Abnormal weight loss documented 07/07/12     20 lbs  . Referred otalgia     Right Ear  . 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 Arthritis    Past Surgical History:  Past Surgical History  Procedure Laterality Date  . Colonoscopy  2006    neg.   . Direct laryngoscopy  07/09/11    Epiglottic mass  . Direct laryngoscopy  07/08/2012    Procedure: DIRECT LARYNGOSCOPY;  Surgeon: Serena Colonel, MD;  Location: [facility] OR;  Service: ENT;  Laterality: N/A;  . Esophagoscopy  07/08/2012    Procedure: ESOPHAGOSCOPY;  Surgeon: Serena Colonel, MD;  Location: [facility] OR;  Service: ENT;  Laterality: N/A;  . Radical neck dissection Bilateral 08/13/2012    Procedure: RADICAL NECK DISSECTION;  Surgeon: Serena Colonel, MD;  Location: [facility] OR;  Service: ENT;  Laterality: Bilateral;  . Tracheostomy tube placement N/A 08/13/2012    Procedure: TRACHEOSTOMY;  Surgeon: Serena Colonel, MD;  Location: [facility] OR;  Service: ENT;  Laterality: N/A;  . Pectoralis flap N/A 08/13/2012    Procedure: RIGHT PECTORALIS MYOCUTANEOUS FLAP to LARYNX;  Surgeon: Etter Sjogren, MD;  Location: [facility] OR;  Service: Plastics;  Laterality: N/A;   HPI:  Patient is a 67 year old male who presents for odynophagia and dysphagia for four months. He reported that liquids and saliva burn when going down. Solid foods hurt and there is referred right otalgia. He has noticed a knot on the right side of neck and lost about 15  pounds unintentionally. CT of neck performed 07/03/12; per report, primary malignancy suspected in the supraglottic, glottic and hyppharyngeal region with necrotic bilateral adenopathy larger on the right (right Level II region measuring up to 2.7cm). Smaller lymph nodes scattered throughout the neck bilaterally including left level II 1.5x1.5x2.6cm node. Quit smoking four days ago; smoked 1 pack per week for 20 years. Also quit alcohol a few days ago. 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    Assessment / Plan / Recommendation Clinical Impression  Patient seen for laryngectomy evaluation. Patient alert, pleasant, and cooperative. Suspect some cognitive deficits based on difficulty consistently following commands and decreased carryover of taught information. Patient able to communicate very basic needs via writing however reports, via Y/N questions, deficits in the areas of reading and writing. Education complete with the usage of visual information regarding current communicative function s/p total laryngectomy. Patient able to utilize electrolarynx with intra-oral device with approximately 25% intellibility of basic vowel sounds with max verbal, visual, and tactile cueing. SLP will continue to f/u with patient at bedside for facilitation of communication and education prior to d/c. Recommend OP f/u after d/c.     SLP Assessment  Patient needs continued Speech Lanaguage Pathology Services    Follow Up Recommendations  Outpatient SLP    Frequency and Duration min 3x week  2 weeks   Pertinent Vitals/Pain None reported   SLP  Goals  SLP Goals Potential to Achieve Goals: Fair Potential Considerations: Co-morbidities Progress/Goals/Alternative treatment plan discussed with pt/caregiver and they: Agree SLP Goal #1: Patient will produce intelligible verbal output at the CVC word level via use of electrolarynx (with intra-oral device) with moderate cues.  SLP Goal #1 -  Progress: Not met  SLP Evaluation Prior Functioning  Cognitive/Linguistic Baseline: Information not available         Expression Verbal Expression Overall Verbal Expression:  (see clinical impression statement)   Oral / Motor Oral Motor/Sensory Function Overall Oral Motor/Sensory Function: Impaired (decreased labial ROM)   GO   Cody Lango MA, CCC-SLP [phone]   Cody [name] 08/17/2012, 1:22 PM

## 2012-08-17 NOTE — Progress Notes (Signed)
08/17/2012  Patient:            Cody [name] Date of Birth:  1945/06/27 MRN:                [id number]  BP 101/60  Pulse 73  Temp(Src) 98.3 F (36.8 C) (Oral)  Resp 16  Ht 5\' 8"  (1.727 m)  Wt 122 lb 12.7 oz (55.7 kg)  BMI 18.68 kg/m2  SpO2 100%  Cody [name] is patient known to Dental Medicine. Patient had been planned for concomitant dental surgery with Dr. Pollyann Kennedy on 08/13/12 when dental case was cancelled due to weather conditions. I have discussed pending dental procedures with Dr. Pollyann Kennedy. Dr. Pollyann Kennedy feels patient is ready for dental procedures in OR at this time.  OR had been scheduled for Thursday -08/19/12 at 11 am..  SUBJECTIVE: Patient denies any significant dental pain at this time. Patient denies any acute dental changes.  OBJECTIVE: No acute dental changes are noted. Tracheostomy is now in place. There is no significant trismus noted.   Assessments: Patient is affected by chronic periodontitis, chronic apical periodontitis, multiple mobile teeth, lateral exostoses.  Plan/recommendations: 1.The plan is to proceed with extraction of remaining teeth with alveoloplasty and pre-prosthetic surgery as indicated in the operating room with general anesthesia via the tracheostomy site on 08/19/2012 and 11 AM. 2. Preop orders have been entered.  Charlynne Pander, DDS

## 2012-08-17 NOTE — Progress Notes (Signed)
I agree with student dietitian note; appropriate revisions have been made.  Kimberly Harris, RD, LDN, CNSC Pager# [phone] After Hours Pager# [phone]   

## 2012-08-18 LAB — CBC
MCH: 30.7 pg (ref 26.0–34.0)
MCV: 87.4 fL (ref 78.0–100.0)
Platelets: 199 10*3/uL (ref 150–400)
RDW: 13.9 % (ref 11.5–15.5)

## 2012-08-18 LAB — CALCIUM: Calcium: 8.5 mg/dL (ref 8.4–10.5)

## 2012-08-18 MED ORDER — WHITE PETROLATUM GEL
Status: AC
  Administered 2012-08-18: 0.2
  Filled 2012-08-18: qty 5

## 2012-08-18 MED ORDER — CEFAZOLIN SODIUM-DEXTROSE 2-3 GM-% IV SOLR
2.0000 g | Freq: Once | INTRAVENOUS | Status: DC
  Filled 2012-08-18: qty 50

## 2012-08-18 NOTE — Progress Notes (Signed)
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  Recommend acute PT to increase strength and independence safely. At this point, recommend HHPT at d/c. PT will follow    PT Assessment  Patient needs continued PT services    Follow Up Recommendations  Home health PT;Supervision/Assistance - 24 hour    Does the patient have the potential to tolerate intense rehabilitation      Barriers to Discharge None      Equipment Recommendations  None recommended by PT    Recommendations for Other Services OT consult   Frequency Min 3X/week    Precautions / Restrictions Precautions Precautions: Other (comment) Precaution Comments: trach collar, PEG, multiple JP drains Restrictions Weight Bearing Restrictions: No   Pertinent Vitals/Pain VSS      Mobility  Bed Mobility Bed Mobility: Supine to Sit;Sitting - Scoot to Edge of Bed Supine to Sit: HOB elevated;5: Supervision Sitting - Scoot to Edge of Bed: 5: Supervision;With rail Details for Bed Mobility Assistance: HOB elevated due to tube feeding Transfers Transfers: Sit to Stand;Stand to Sit Sit to Stand: 4: Min guard;From bed;With upper extremity assist Stand to Sit: 4: Min guard;To chair/3-in-1;With upper extremity assist Details for Transfer Assistance: vc's for taking his time, is a bit impulsive. Min-guard A for balance Ambulation/Gait Ambulation/Gait Assistance: 4: Min assist Ambulation Distance (Feet): 8 Feet Assistive device: 1 person hand held assist Ambulation/Gait Assistance Details: ambulated 4' to chair and also performed marching in place and sidestepping right and left.  Several LOB with min A to correct. Poor single leg balance. Gait Pattern: Step-through pattern;Decreased stride length Stairs: No Wheelchair Mobility Wheelchair Mobility: No    Exercises General Exercises - Lower Extremity Ankle Circles/Pumps: AROM;Both;15 reps;Seated Quad Sets: AROM;Both;10 reps;Seated Gluteal Sets: AROM;10 reps;Seated;Both Straight Leg Raises: AROM;Both;10 reps;Seated   PT Diagnosis: Abnormality of gait;Generalized weakness;Acute pain  PT Problem List: Decreased strength;Decreased balance;Decreased mobility;Decreased knowledge of use of DME;Decreased safety awareness;Pain PT Treatment Interventions: Gait training;Functional mobility training;Therapeutic activities;Therapeutic exercise;Balance training;Patient/family education   PT Goals Acute Rehab PT Goals PT Goal Formulation: With patient Time For Goal Achievement: 09/01/12 Potential to Achieve Goals: Good Pt will go Supine/Side to Sit: with modified independence PT Goal: Supine/Side to Sit - Progress: Goal set today Pt will go Sit to Supine/Side: with modified independence PT Goal: Sit to Supine/Side - Progress: Goal set today Pt will go Sit to Stand: with modified independence PT Goal: Sit to Stand - Progress: Goal set today Pt will go Stand to Sit: with modified independence PT Goal: Stand to Sit - Progress: Goal set today Pt will Ambulate: >150 feet;with least restrictive assistive device;with supervision PT Goal: Ambulate - Progress: Goal set today Pt will Perform Home Exercise Program: with supervision, verbal cues required/provided PT Goal: Perform Home Exercise Program - Progress: Goal set today  Visit Information  Last PT Received On: 08/18/12 Assistance Needed: +1    Subjective Data  Subjective: pt unable to vocalize but shook head "yes" to "pain?" and RN notified, pain meds given IV Patient Stated Goal: return home   Prior Functioning  Home Living Lives With: Significant other Available  Help at Discharge: Family;Available 24 hours/day Type of Home: House Home Access:  Level entry Home Layout: One level Home Adaptive Equipment: None Additional Comments: pt lives with his children's mother, though she is unable to care for him due to her own health issues. Pt's daughter is taking FMLA and states that he can go to her house for recovery and during radiation and chemo. Prior to this, he went to an adult day program where he was very independent and often helped with the other members in the class. He is illiterate and cannot vocalize at this point making communication difficult. Prior Function Level of Independence: Independent Able to Take Stairs?: Yes Driving: No Vocation: Unemployed Communication Communication: Tracheostomy    Cognition  Cognition Overall Cognitive Status: History of cognitive impairments - at baseline Arousal/Alertness: Awake/alert Orientation Level: Appears intact for tasks assessed Behavior During Session: [facility] for tasks performed Cognition - Other Comments: pt had cognitive impairments at baseline as well as some physical impairments with lifetime gait abnormality from a club foot. Per daughter, cognition appears to be at baseline    Extremity/Trunk Assessment Right Upper Extremity Assessment RUE ROM/Strength/Tone: [facility] for tasks assessed Left Upper Extremity Assessment LUE ROM/Strength/Tone: [facility] for tasks assessed Right Lower Extremity Assessment RLE ROM/Strength/Tone: Deficits RLE ROM/Strength/Tone Deficits: hip flex 4/5, knee flex/ ext 4/5, mild generalized weakness from surgery RLE Sensation: WFL - Light Touch RLE Coordination: WFL - gross motor Left Lower Extremity Assessment LLE ROM/Strength/Tone: Deficits LLE ROM/Strength/Tone Deficits: strength same as RLE, club foot on left affecting balance LLE Sensation: WFL - Light Touch LLE Coordination: WFL - gross motor Trunk Assessment Trunk Assessment: Kyphotic   Balance Balance Balance  Assessed: Yes Dynamic Standing Balance Dynamic Standing - Balance Support: Bilateral upper extremity supported;During functional activity Dynamic Standing - Level of Assistance: 4: Min assist  End of Session PT - End of Session Activity Tolerance: Patient tolerated treatment well Patient left: in chair;with call bell/phone within reach;with family/visitor present Nurse Communication: Mobility status  GP   Lyanne Co, PT  Acute Rehab Services  [phone]   Lyanne Co 08/18/2012, 9:54 AM

## 2012-08-18 NOTE — Progress Notes (Signed)
Subjective: No complaints.  Objective: Vital signs in last 24 hours: Temp:  [98.5 F (36.9 C)-99.3 F (37.4 C)] 98.5 F (36.9 C) (03/12 0631) Pulse Rate:  [71-88] 79 (03/12 0810) Resp:  [16-18] 16 (03/12 0810) BP: (103-133)/(54-73) 114/65 mmHg (03/12 0631) SpO2:  [99 %-100 %] 99 % (03/12 0810) FiO2 (%):  [28 %] 28 % (03/12 0810) Weight change:  Last BM Date: 08/17/12  Intake/Output from previous day: 03/11 0701 - 03/12 0700 In: 1358 [NG/GT:1358] Out: 1175 [Urine:1100; Drains:75] Intake/Output this shift:    PHYSICAL EXAM: Stoma cleaned of minimal crusting. Flaps all viable.  Lab Results: No results found for this basename: WBC, HGB, HCT, PLT,  in the last 72 hours BMET  Recent Labs  08/16/12 0446 08/17/12 0625 08/18/12 0622  NA 140 138  --   K 3.3* 4.3  --   CL 106 104  --   CO2 29 30  --   GLUCOSE 123* 108*  --   BUN 7 7  --   CREATININE 0.69 0.75  --   CALCIUM 7.9* 8.2* 8.5    Studies/Results: No results found.  Medications: I have reviewed the patient's current medications.  Assessment/Plan: Progressing nicely. Calcium and potassium have returned to normal. No further supplementation. JP output decreasing nicely - possibly remove neck drains tomorrow.   LOS: 6 days   Cody [name] 08/18/2012, 8:55 AM

## 2012-08-19 ENCOUNTER — Inpatient Hospital Stay (HOSPITAL_COMMUNITY): Payer: Medicare Other | Admitting: Anesthesiology

## 2012-08-19 ENCOUNTER — Encounter (HOSPITAL_COMMUNITY)
Admission: AD | Disposition: A | Discharge status: Home / Self Care | Payer: Self-pay | Source: Ambulatory Visit | Attending: Otolaryngology

## 2012-08-19 ENCOUNTER — Encounter (HOSPITAL_COMMUNITY): Payer: Self-pay | Admitting: Anesthesiology

## 2012-08-19 ENCOUNTER — Encounter (HOSPITAL_COMMUNITY): Payer: Self-pay | Admitting: Certified Registered"

## 2012-08-19 DIAGNOSIS — K045 Chronic apical periodontitis: Secondary | ICD-10-CM

## 2012-08-19 DIAGNOSIS — M278 Other specified diseases of jaws: Secondary | ICD-10-CM

## 2012-08-19 DIAGNOSIS — K0530 Chronic periodontitis, unspecified: Secondary | ICD-10-CM

## 2012-08-19 HISTORY — PX: LARYNGOSCOPY: SHX5203

## 2012-08-19 HISTORY — PX: MULTIPLE EXTRACTIONS WITH ALVEOLOPLASTY: SHX5342

## 2012-08-19 LAB — COMPREHENSIVE METABOLIC PANEL
ALT: 20 U/L (ref 0–53)
CO2: 28 mEq/L (ref 19–32)
Calcium: 8.2 mg/dL — ABNORMAL LOW (ref 8.4–10.5)
Creatinine, Ser: 0.69 mg/dL (ref 0.50–1.35)
GFR calc Af Amer: >90 mL/min (ref >90)
GFR calc non Af Amer: >90 mL/min (ref >90)
Glucose, Bld: 109 mg/dL — ABNORMAL HIGH (ref 70–99)
Sodium: 136 mEq/L (ref 135–145)
Total Bilirubin: 0.6 mg/dL (ref 0.3–1.2)

## 2012-08-19 LAB — GLUCOSE, CAPILLARY
Glucose-Capillary: 90 mg/dL (ref 70–99)
Glucose-Capillary: 104 mg/dL — ABNORMAL HIGH (ref 70–99)

## 2012-08-19 LAB — CBC
Hemoglobin: 9.2 g/dL — ABNORMAL LOW (ref 13.0–17.0)
MCHC: 35.4 g/dL (ref 30.0–36.0)
Platelets: 221 10*3/uL (ref 150–400)
RDW: 14.1 % (ref 11.5–15.5)

## 2012-08-19 SURGERY — MULTIPLE EXTRACTION WITH ALVEOLOPLASTY
Anesthesia: General | Site: Throat | Wound class: Clean Contaminated

## 2012-08-19 MED ORDER — PROPOFOL 10 MG/ML IV BOLUS
INTRAVENOUS | Status: DC | PRN
  Administered 2012-08-19 (×2): 50 mg via INTRAVENOUS
  Administered 2012-08-19: 100 mg via INTRAVENOUS

## 2012-08-19 MED ORDER — JEVITY 1.2 CAL PO LIQD: 1000.0000 mL | ORAL | Status: DC

## 2012-08-19 MED ORDER — LIDOCAINE-EPINEPHRINE 2 %-1:100000 IJ SOLN
INTRAMUSCULAR | Status: DC | PRN
  Administered 2012-08-19: 10.2 mL

## 2012-08-19 MED ORDER — GLYCOPYRROLATE 0.2 MG/ML IJ SOLN
INTRAMUSCULAR | Status: DC | PRN
  Administered 2012-08-19: 0.4 mg via INTRAVENOUS

## 2012-08-19 MED ORDER — ONDANSETRON HCL 4 MG/2ML IJ SOLN: 4.0000 mg | Freq: Once | INTRAMUSCULAR | Status: DC | PRN

## 2012-08-19 MED ORDER — HYDROMORPHONE HCL PF 1 MG/ML IJ SOLN: 0.2500 mg | INTRAMUSCULAR | Status: DC | PRN

## 2012-08-19 MED ORDER — LIDOCAINE HCL (CARDIAC) 20 MG/ML IV SOLN
INTRAVENOUS | Status: DC | PRN
  Administered 2012-08-19: 100 mg via INTRAVENOUS

## 2012-08-19 MED ORDER — LACTATED RINGERS IV SOLN
INTRAVENOUS | Status: DC | PRN
  Administered 2012-08-19 (×2): via INTRAVENOUS

## 2012-08-19 MED ORDER — BUPIVACAINE-EPINEPHRINE 0.5% -1:200000 IJ SOLN
INTRAMUSCULAR | Status: DC | PRN
  Administered 2012-08-19: 3.6 mL

## 2012-08-19 MED ORDER — JEVITY 1.2 CAL PO LIQD
360.0000 mL | Freq: Four times a day (QID) | ORAL | Status: DC
  Filled 2012-08-19: qty 474

## 2012-08-19 MED ORDER — OXYMETAZOLINE HCL 0.05 % NA SOLN
NASAL | Status: AC
  Filled 2012-08-19: qty 15

## 2012-08-19 MED ORDER — JEVITY 1.2 CAL PO LIQD
1000.0000 mL | ORAL | Status: DC
  Administered 2012-08-19: 1000 mL
  Filled 2012-08-19 (×2): qty 1000
  Filled 2012-08-19: qty 237
  Filled 2012-08-19 (×4): qty 1000
  Filled 2012-08-19: qty 237
  Filled 2012-08-19 (×2): qty 1000

## 2012-08-19 MED ORDER — ARTIFICIAL TEARS OP OINT
TOPICAL_OINTMENT | OPHTHALMIC | Status: DC | PRN
  Administered 2012-08-19: 1 via OPHTHALMIC

## 2012-08-19 MED ORDER — ROCURONIUM BROMIDE 100 MG/10ML IV SOLN
INTRAVENOUS | Status: DC | PRN
  Administered 2012-08-19: 30 mg via INTRAVENOUS

## 2012-08-19 MED ORDER — STERILE WATER FOR IRRIGATION IR SOLN
Status: DC | PRN
  Administered 2012-08-19: 1

## 2012-08-19 MED ORDER — LACTATED RINGERS IV SOLN
INTRAVENOUS | Status: DC
  Administered 2012-08-19 – 2012-08-21 (×3): via INTRAVENOUS

## 2012-08-19 MED ORDER — LIDOCAINE-EPINEPHRINE 2 %-1:100000 IJ SOLN
INTRAMUSCULAR | Status: AC
  Filled 2012-08-19: qty 10.2

## 2012-08-19 MED ORDER — NEOSTIGMINE METHYLSULFATE 1 MG/ML IJ SOLN
INTRAMUSCULAR | Status: DC | PRN
  Administered 2012-08-19: 3 mg via INTRAVENOUS

## 2012-08-19 MED ORDER — SODIUM CHLORIDE 0.9 % IR SOLN
200.0000 mL | Status: DC
  Administered 2012-08-20: 30 mL

## 2012-08-19 MED ORDER — PHENYLEPHRINE HCL 10 MG/ML IJ SOLN
INTRAMUSCULAR | Status: DC | PRN
  Administered 2012-08-19 (×2): 80 ug via INTRAVENOUS
  Administered 2012-08-19: 120 ug via INTRAVENOUS
  Administered 2012-08-19 (×3): 80 ug via INTRAVENOUS

## 2012-08-19 MED ORDER — BUPIVACAINE-EPINEPHRINE PF 0.5-1:200000 % IJ SOLN
INTRAMUSCULAR | Status: AC
  Filled 2012-08-19: qty 10.8

## 2012-08-19 MED ORDER — LACTATED RINGERS IV SOLN
INTRAVENOUS | Status: DC
  Administered 2012-08-19 (×2): 20 mL/h via INTRAVENOUS

## 2012-08-19 MED ORDER — LIDOCAINE HCL 4 % MT SOLN
OROMUCOSAL | Status: DC | PRN
  Administered 2012-08-19: 4 mL via TOPICAL

## 2012-08-19 MED ORDER — FENTANYL CITRATE 0.05 MG/ML IJ SOLN
INTRAMUSCULAR | Status: DC | PRN
  Administered 2012-08-19: 25 ug via INTRAVENOUS
  Administered 2012-08-19 (×3): 50 ug via INTRAVENOUS
  Administered 2012-08-19: 25 ug via INTRAVENOUS

## 2012-08-19 MED ORDER — SODIUM CHLORIDE 0.9 % IR SOLN
Status: DC | PRN
  Administered 2012-08-19: 1

## 2012-08-19 SURGICAL SUPPLY — 37 items
ALCOHOL 70% 16 OZ (MISCELLANEOUS) ×3 IMPLANT
ATTRACTOMAT 16X20 MAGNETIC DRP (DRAPES) ×3 IMPLANT
BLADE SURG 15 STRL LF DISP TIS (BLADE) ×4 IMPLANT
BLADE SURG 15 STRL SS (BLADE) ×6
CLOTH BEACON ORANGE TIMEOUT ST (SAFETY) ×3 IMPLANT
COVER SURGICAL LIGHT HANDLE (MISCELLANEOUS) ×3 IMPLANT
CRADLE DONUT ADULT HEAD (MISCELLANEOUS) ×2 IMPLANT
GAUZE PACKING FOLDED 2  STR (GAUZE/BANDAGES/DRESSINGS) ×1
GAUZE PACKING FOLDED 2 STR (GAUZE/BANDAGES/DRESSINGS) ×2 IMPLANT
GAUZE SPONGE 4X4 16PLY XRAY LF (GAUZE/BANDAGES/DRESSINGS) ×4 IMPLANT
GLOVE SURG ORTHO 8.0 STRL STRW (GLOVE) ×3 IMPLANT
GLOVE SURG SS PI 6.5 STRL IVOR (GLOVE) ×3 IMPLANT
GOWN STRL REIN 3XL LVL4 (GOWN DISPOSABLE) ×3 IMPLANT
HEMOSTAT SURGICEL .5X2 ABSORB (HEMOSTASIS) IMPLANT
KIT BASIN OR (CUSTOM PROCEDURE TRAY) ×3 IMPLANT
KIT ROOM TURNOVER OR (KITS) ×3 IMPLANT
MANIFOLD NEPTUNE WASTE (CANNULA) ×3 IMPLANT
NDL BLUNT 16X1.5 OR ONLY (NEEDLE) ×2 IMPLANT
NDL DENTAL 27 LONG (NEEDLE) IMPLANT
NEEDLE BLUNT 16X1.5 OR ONLY (NEEDLE) ×3 IMPLANT
NEEDLE DENTAL 27 LONG (NEEDLE) ×6 IMPLANT
NS IRRIG 1000ML POUR BTL (IV SOLUTION) ×3 IMPLANT
PACK EENT II TURBAN DRAPE (CUSTOM PROCEDURE TRAY) ×3 IMPLANT
PAD ARMBOARD 7.5X6 YLW CONV (MISCELLANEOUS) ×6 IMPLANT
SPONGE GAUZE 4X4 12PLY (GAUZE/BANDAGES/DRESSINGS) ×1 IMPLANT
SPONGE SURGIFOAM ABS GEL 100 (HEMOSTASIS) IMPLANT
SPONGE SURGIFOAM ABS GEL 12-7 (HEMOSTASIS) IMPLANT
SPONGE SURGIFOAM ABS GEL SZ50 (HEMOSTASIS) IMPLANT
SUCTION FRAZIER TIP 10 FR DISP (SUCTIONS) ×3 IMPLANT
SUT CHROMIC 3 0 PS 2 (SUTURE) ×8 IMPLANT
SUT CHROMIC 4 0 P 3 18 (SUTURE) IMPLANT
SYR 50ML SLIP (SYRINGE) ×3 IMPLANT
TOWEL OR 17X24 6PK STRL BLUE (TOWEL DISPOSABLE) ×3 IMPLANT
TOWEL OR 17X26 10 PK STRL BLUE (TOWEL DISPOSABLE) ×3 IMPLANT
TUBE CONNECTING 12X1/4 (SUCTIONS) ×3 IMPLANT
WATER STERILE IRR 1000ML POUR (IV SOLUTION) ×3 IMPLANT
YANKAUER SUCT BULB TIP NO VENT (SUCTIONS) ×3 IMPLANT

## 2012-08-19 NOTE — Progress Notes (Signed)
Subjective: No complaints. He is undergoing full mouth extraction in the operating room currently. Evaluation and exam were performed in the operating room.  Objective: Vital signs in last 24 hours: Temp:  [97.2 F (36.2 C)-98.7 F (37.1 C)] 97.2 F (36.2 C) (03/13 1009) Pulse Rate:  [71-95] 71 (03/13 1009) Resp:  [15-19] 18 (03/13 1009) BP: (106-120)/(62-73) 116/62 mmHg (03/13 1009) SpO2:  [96 %-100 %] 100 % (03/13 1009) FiO2 (%):  [28 %] 28 % (03/13 0814) Weight change:  Last BM Date: 08/18/12  Intake/Output from previous day: 03/12 0701 - 03/13 0700 In: 1621.8 [I.V.:1271.8; NG/GT:350] Out: 1285 [Urine:1200; Drains:85] Intake/Output this shift:    PHYSICAL EXAM: Stoma is clear. Both neck drains were removed. The number for chest drain was removed as well. An anesthesia laryngoscope was used to inspect the pharynx. The superior part of the suture line was inspected and was intact. The skin appears to be viable at the superior tip of the flap. I couldn't see any further. A small amount of exudate was suctioned out.  Lab Results:  Recent Labs  08/18/12 0850 08/19/12 0625  WBC 9.4 7.5  HGB 9.5* 9.2*  HCT 27.0* 26.0*  PLT 199 221   BMET  Recent Labs  08/17/12 0625 08/18/12 0622 08/19/12 0625  NA 138  --  136  K 4.3  --  4.3  CL 104  --  100  CO2 30  --  28  GLUCOSE 108*  --  109*  BUN 7  --  9  CREATININE 0.75  --  0.69  CALCIUM 8.2* 8.5 8.2*    Studies/Results: No results found.  Medications: I have reviewed the patient's current medications.  Assessment/Plan: Stable postop. Proceed with full mouth extraction. Continue tube feeds. Neck drains are out. At one remaining chest drain in place. We will start a liquid diet in a few days if all is well. At this point, there is no signs of a leak.  LOS: 7 days   ROSEN, JEFRY 08/19/2012, 11:41 AM

## 2012-08-19 NOTE — Progress Notes (Signed)
Subjective: Feels better.  Objective: Vital signs in last 24 hours: Temp:  [97.2 F (36.2 C)-98.7 F (37.1 C)] 97.2 F (36.2 C) (03/13 1009) Pulse Rate:  [71-95] 71 (03/13 1009) Resp:  [15-19] 18 (03/13 1009) BP: (106-120)/(62-73) 116/62 mmHg (03/13 1009) SpO2:  [96 %-100 %] 100 % (03/13 1009) FiO2 (%):  [28 %] 28 % (03/13 0814)  Intake/Output from previous day: 03/12 0701 - 03/13 0700 In: 1621.8 [I.V.:1271.8; NG/GT:350] Out: 1285 [Urine:1200; Drains:85] Intake/Output this shift:    Operative sites: Flap donor site looks good. No evidence of bleeding or infection. Muscle is soft.  Drains functioning. Drainage thin and non-purulent.   Recent Labs  08/17/12 0625 08/18/12 0850 08/19/12 0625  WBC  --  9.4 7.5  HGB  --  9.5* 9.2*  HCT  --  27.0* 26.0*  NA 138  --  136  K 4.3  --  4.3  CL 104  --  100  CO2 30  --  28  BUN 7  --  9  CREATININE 0.75  --  0.69    Studies/Results: No results found.  Assessment/Plan: Good progress. Should be ready for drain removal at donor site soon.   LOS: 7 days    BOWERS, DAVID M 08/19/2012 11:00 AM

## 2012-08-19 NOTE — Anesthesia Preprocedure Evaluation (Addendum)
Anesthesia Evaluation  Patient identified by MRN, date of birth, ID band Patient awake    Reviewed: Allergy & Precautions, H&P , NPO status , Patient's Chart, lab work & pertinent test results  Airway       Dental   Pulmonary          Cardiovascular Rhythm:regular Rate:Normal     Neuro/Psych    GI/Hepatic   Endo/Other    Renal/GU      Musculoskeletal   Abdominal   Peds  Hematology   Anesthesia Other Findings   Reproductive/Obstetrics                           Anesthesia Physical Anesthesia Plan  ASA: II  Anesthesia Plan: General   Post-op Pain Management:    Induction: Intravenous  Airway Management Planned: Oral ETT  Additional Equipment:   Intra-op Plan:   Post-operative Plan: Extubation in OR  Informed Consent: I have reviewed the patients History and Physical, chart, labs and discussed the procedure including the risks, benefits and alternatives for the proposed anesthesia with the patient or authorized representative who has indicated his/her understanding and acceptance.     Plan Discussed with: CRNA, Anesthesiologist and Surgeon  Anesthesia Plan Comments: (Plan 6.5 armored tube via tracheostomy)       Anesthesia Quick Evaluation

## 2012-08-19 NOTE — Progress Notes (Signed)
PRE-OPERATIVE NOTE:  08/19/2012 Cody [name] [id number]  VITALS: BP 106/63  Pulse 72  Temp(Src) 98.4 F (36.9 C) (Oral)  Resp 18  Ht 5\' 8"  (1.727 m)  Wt 122 lb 12.7 oz (55.7 kg)  BMI 18.68 kg/m2  SpO2 100%  Lab Results  Component Value Date   WBC 7.5 08/19/2012   HGB 9.2* 08/19/2012   HCT 26.0* 08/19/2012   MCV 88.1 08/19/2012   PLT 221 08/19/2012   BMET    Component Value Date/Time   NA 136 08/19/2012 0625   K 4.3 08/19/2012 0625   CL 100 08/19/2012 0625   CO2 28 08/19/2012 0625   GLUCOSE 109* 08/19/2012 0625   BUN 9 08/19/2012 0625   CREATININE 0.69 08/19/2012 0625   CALCIUM 8.2* 08/19/2012 0625   GFRNONAA >90 08/19/2012 0625   GFRAA >90 08/19/2012 0625    No results found for this basename: INR, PROTIME   No results found for this basename: PTT     Cody [name] presents for extraction remaining teeth with alveoloplasty and pre-prosthetic surgery as indicated in the operating room with general anesthesia.   SUBJECTIVE: The patient denies any acute medical or dental changes and agrees to proceed with treatment as planned.  EXAM: No sign of acute dental changes.  ASSESSMENT: Patient is affected by chronic periodontitis, chronic apical periodontitis, tooth mobility, malocclusion, lateral exostoses, and a history of oral neglect with anticipated postoperative radiation therapy.  PLAN: Patient agrees to proceed with treatment as planned in the operating room as previously discussed and accepts the risks, benefits, complications of the proposed treatment.  Charlynne Pander, DDS

## 2012-08-19 NOTE — Anesthesia Postprocedure Evaluation (Signed)
  Anesthesia Post-op Note  Patient: Cody [name]  Procedure(s) Performed: Procedure(s) with comments: Extraction of tooth #'s 1,2,3,4,5,6,10,11,12,13,14,16,17,18,19,21,22,23, [phone] with alveoloplasty and bilateral mandibular buccal exostoses reductions (N/A) LARYNGOSCOPY (N/A) - Anesthesia's Laryngoscope  Patient Location: PACU  Anesthesia Type:General  Level of Consciousness: awake, oriented and patient cooperative  Airway and Oxygen Therapy: Patient Spontanous Breathing  Post-op Pain: mild  Post-op Assessment: Post-op Vital signs reviewed, Patient's Cardiovascular Status Stable, Respiratory Function Stable, Patent Airway, No signs of Nausea or vomiting and Pain level controlled  Post-op Vital Signs: stable  Complications: No apparent anesthesia complications

## 2012-08-19 NOTE — Progress Notes (Signed)
SPEECH/LANGUAGE PATHOLOGY  08/19/12: F/u laryngectomy tx; pt currently in surgery.  Will return next date.  Amanda L. Samson Frederic, [place] CCC/SLP Pager [phone]

## 2012-08-19 NOTE — Transfer of Care (Signed)
Immediate Anesthesia Transfer of Care Note  Patient: CHANZ CAHALL  Procedure(s) Performed: Procedure(s) with comments: Extraction of tooth #'s 1,2,3,4,5,6,10,11,12,13,14,16,17,18,19,21,22,23, [phone] with alveoloplasty and bilateral mandibular buccal exostoses reductions (N/A) LARYNGOSCOPY (N/A) - Anesthesia's Laryngoscope  Patient Location: PACU  Anesthesia Type:General  Level of Consciousness: awake, alert  and oriented  Airway & Oxygen Therapy: Patient Spontanous Breathing and Patient connected to tracheostomy mask oxygen  Post-op Assessment: Report given to PACU RN and Post -op Vital signs reviewed and stable  Post vital signs: Reviewed and stable  Complications: No apparent anesthesia complications

## 2012-08-19 NOTE — Op Note (Signed)
Patient:            Cody [name] Date of Birth:  1945-06-16 MRN:                [id number]   DATE OF PROCEDURE:  08/19/2012               OPERATIVE REPORT   PREOPERATIVE DIAGNOSES: 1. Laryngeal cancer 2. Preradiation therapy dental protocol 3. Chronic periodontitis 4. 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 Laryngeal cancer 2. Preradiation therapy dental protocol 3. Chronic periodontitis 4. 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 Multiple extraction of tooth numbers 1, 2, 3, 4, 5, 6, 10, 11, 12, 13, 15, 16, 17, 18, 19, 21, 22, 23, 24, 25, 26, 27, 28, 29, 30, 31, and 32. 2. 4 Quadrants of alveoloplasty 3. Bilateral mandibular buccal exostoses reductions   SURGEON: Charlynne Pander, DDS  ASSISTANT: Zettie Pho, (dental assistant)  ANESTHESIA: General anesthesia via tracheostomy site.  MEDICATIONS: 1. Ancef 2 g IV prior to invasive dental procedures. 2. Local anesthesia with a total utilization of 5 carpules each containing 34 mg of lidocaine with 0.017 mg of epinephrine as well as 2 carpules each containing 9 mg of bupivacaine with 0.009 mg of epinephrine.  SPECIMENS: There are 27 teeth that were discarded.  DRAINS: None  CULTURES: None  COMPLICATIONS: None   ESTIMATED BLOOD LOSS: 150 mLs.  INTRAVENOUS FLUIDS: 1100 mLs of Lactated ringers solution.  INDICATIONS: The patient was recently diagnosed with laryngeal cancer.  A dental consultation was then requested as part of a preradiation therapy dental protocol.  The patient was examined and treatment planned for extraction of remaining teeth with alveoloplasty and pre-prosthetic surgery as indicated.  This treatment plan was formulated to decrease the risks and complications associated with dental infection from affecting the patient's systemic health and to prevent future complications such as infection and  osteoradionecrosis from the anticipated radiation therapy.  OPERATIVE FINDINGS: Patient was examined operating room number 8.  The teeth were identified for extraction. The patient was noted be affected by chronic periodontitis, chronic apical periodontitis, multiple loose teeth, and bilateral mandibular buccal exostoses.   DESCRIPTION OF PROCEDURE: Patient was brought to the main operating room number 8. Patient was then placed in the supine position on the operating table. General Anesthesia was then induced per the anesthesia team. The patient was then examined by Dr. Pollyann Kennedy with direct laryngoscopy as indicated.  The patient was then handed over to dental medicine for the dental medicine procedures.  The patient was then prepped and draped in the usual manner for dental medicine procedure. A timeout was performed. The patient was identified and procedures were verified. A throat pack was carefully placed placed at this time attention to avoid previous laryngectomy site. The oral cavity was then thoroughly examined with the findings noted above. The patient was then ready for dental medicine procedure as follows:  Local anesthesia was then administered sequentially with a total utilization of 5 carpules each containing 34 mg of lidocaine with 0.017 mg of epinephrine as well as 2 carpules  each containing 9 mg bupivacaine with 0.009 mg of epinephrine.  The Maxillary left and right quadrants first approached. Anesthesia was then delivered utilizing infiltration with lidocaine with epinephrine. A #15 blade incision was then made from the maxillary right tuberosity and extended to the maxillary left tuberosity.  A  surgical flap was then carefully reflected. Appropriate amounts of buccal and interseptal  bone were then removed utilizing a surgical handpiece and bur and copious amounts of sterile water.  The teeth were then subluxated with a series of straight elevators. Tooth numbers 1, 2, 3, 4, 5, 6, 10,  11, 12, 13, 15, 16 were then removed with a series of forceps to include a 150, 53L, 53R forceps without complications. Alveoloplasty was then performed utilizing a ronguers and bone file. The surgical site was then irrigated with copious amounts of sterile saline. The tissues were approximated and trimmed appropriately. The maxillary right surgical site was then closed from the maxillary right tuberosity and extended the mesial #8 utilizing 3-0 chromic gut suture in a continuous interrupted suture technique x1. The maxillary left surgical site was then closed and the maxillary left tuberosity and extended the mesial #utilizing 3-0 chromic gut suture in a continuous interrupted suture technique x1.  At this point time, the mandibular quadrants were approached. The patient was given bilateral inferior alveolar nerve blocks and long buccal nerve blocks utilizing the bupivacaine with epinephrine. Further infiltration was then achieved utilizing the lidocaine with epinephrine. A 15 blade incision was then made from the distal of number 17 and extended to the distal of #32.  A surgical flap was then carefully reflected. Appropriate amounts of buccal and interseptal bone were then removed appropriately utilizing a surgical handpiece and bur and copious amounts sterile saline. Tooth numbers 17, 18, 19, 21, 22, 23, 24, 25, 26, 27, 28, 29, 30, 31, and 32 were then removed utilizing a series of mandibular forceps to include a 17 forceps, a 23 forceps, a 151 forceps. Multiple retained root segments in the area of #17, 18, 19, 21, 28, 29, 30, 31, and 32 were left remaining and after further bone was removed around these retained roots utilizing a surgical handpiece and bur and copious amounts sterile water, the retained roots were elevated out with cryers elevators without further complication. Alveoloplasty was then performed utilizing a rongeurs and bone file. At this point time the buccal exostoses in the area of #19 and  in the area of numbers 30-31 were removed with a surgical handpiece and bur and copious amounts sterile saline. Alveoloplasty was then again performed utilizing a Rongeurs and bone file. The tissues were approximated and trimmed appropriately. The surgical sites were then irrigated with copious amounts of sterile saline x4. The mandibular surgical site was then closed from the distal of 32 and extended the mesial of #25 utilizing 3-0 chromic gut suture in a continuous interrupted suture technique x1. The mandibular left surgical site was then closed from the distal of #17 and extended the mesial numbers 24 utilizing 3-0 chromic gut suture in a continuous interrupted suture technique x1. At this point time 3 individual interrupted sutures were then placed to further closed surgical site and mandibular anterior region.  At this point time, the entire mouth was irrigated with copious amounts of sterile saline. The patient was examined for complications, seeing none, the dental medicine procedure was deemed to be complete. The throat pack was removed at this time. A series of 4 x 4 gauze were placed in the mouth to aid hemostasis. The patient was then handed over to the anesthesia team for final disposition. After an appropriate amount of time, the patient had the general anesthesia reversed and was taken to the postanesthsia care unit with stable vital signs and a good condition. All counts were correct for the dental medicine procedure.   Charlynne Pander, DDS.

## 2012-08-19 NOTE — Anesthesia Procedure Notes (Signed)
Procedure Name: Intubation Date/Time: 08/19/2012 11:29 AM Performed by: Arlice Colt B Pre-anesthesia Checklist: Patient identified, Emergency Drugs available, Suction available, Patient being monitored and Timeout performed Patient Re-evaluated:Patient Re-evaluated prior to inductionOxygen Delivery Method: Circle system utilized Preoxygenation: Pre-oxygenation with 100% oxygen Intubation Type: IV induction Tube type: Reinforced (6.5 reinforced tube inserted without difficulty into tracheostomy) Placement Confirmation: positive ETCO2 and breath sounds checked- equal and bilateral Tube secured with: Tape

## 2012-08-19 NOTE — Progress Notes (Signed)
RT Note: New ATC set up

## 2012-08-20 DIAGNOSIS — C329 Malignant neoplasm of larynx, unspecified: Secondary | ICD-10-CM

## 2012-08-20 DIAGNOSIS — Z0189 Encounter for other specified special examinations: Secondary | ICD-10-CM

## 2012-08-20 LAB — GLUCOSE, CAPILLARY
Glucose-Capillary: 97 mg/dL (ref 70–99)
Glucose-Capillary: 97 mg/dL (ref 70–99)

## 2012-08-20 LAB — CALCIUM: Calcium: 8.3 mg/dL — ABNORMAL LOW (ref 8.4–10.5)

## 2012-08-20 NOTE — Progress Notes (Signed)
No new complaints. Stoma with minimal crusting removed. Flaps all look healthy. Continue care, possible po liquids in 1-2 days.

## 2012-08-20 NOTE — Progress Notes (Signed)
POST OPERATIVE NOTE: Post op day #1 from total odontectomy with alveoloplasty and pre-prosthetetic surgery  08/20/2012 Cody [name] [id number]  VITALS: BP 97/58  Pulse 82  Temp(Src) 98.3 F (36.8 C) (Axillary)  Resp 18  Ht 5\' 8"  (1.727 m)  Wt 122 lb 12.7 oz (55.7 kg)  BMI 18.68 kg/m2  SpO2 100%  Lab Results  Component Value Date   WBC 7.5 08/19/2012   HGB 9.2* 08/19/2012   HCT 26.0* 08/19/2012   MCV 88.1 08/19/2012   PLT 221 08/19/2012   BMET    Component Value Date/Time   NA 136 08/19/2012 0625   K 4.3 08/19/2012 0625   CL 100 08/19/2012 0625   CO2 28 08/19/2012 0625   GLUCOSE 109* 08/19/2012 0625   BUN 9 08/19/2012 0625   CREATININE 0.69 08/19/2012 0625   CALCIUM 8.3* 08/20/2012 0542   GFRNONAA >90 08/19/2012 0625   GFRAA >90 08/19/2012 0625   Cody [name] is status post extraction remaining teeth with alveoloplasty and pre-prosthetic surgery.  SUBJECTIVE: Patient currently denies any significant dental pain from the dental extractions.  EXAM: No sign of infection, heme, or ooze. Lower lips with some swelling noted. Sutures are intact. Some intraoral ecchymosis noted.  ASSESSMENT: Post operative course is consistent with dental procedures performed in the operating room.   PLAN: 1. Start salt water rinses every 2 hours while awake. 2. Evaluate for suture removal in approximately 10 days. Daughter, [name], to call for an appointment in dental medicine at [phone]. 3. Call if problems arise.  Charlynne Pander, DDS   Charlynne Pander, DDS

## 2012-08-20 NOTE — Progress Notes (Signed)
Speech/language pathology  Pt s/p dental excision, laryngectomy.  Paperwork from [facility] [employer] and [employer] removed from shadow chart; call placed to division of services for deaf and hard-of-hearing for clarification of devices available (telecommunications, Geographical information systems officer) and awaiting their return call.  Will continue efforts to procure electrolarynx.  Plan: Please order OP SLP if pt is D/Cd during weekend.  Amanda L. Samson Frederic, [place] CCC/SLP Pager [phone]

## 2012-08-20 NOTE — Op Note (Signed)
OPERATIVE REPORT  DATE OF SURGERY: 08/20/2012  PATIENT:  Cody [name],  67 y.o. male  PRE-OPERATIVE DIAGNOSIS:  LARYNGEAL CANCER AND CHRONIC PERIDONTITIS  POST-OPERATIVE DIAGNOSIS:  LARYNGEAL CANCER AND CHRONIC PERIDONTITIS  PROCEDURE:  Procedure(s): Extraction of tooth #'s 1,2,3,4,5,6,10,11,12,13,14,16,17,18,19,21,22,23, [phone] with alveoloplasty and bilateral mandibular buccal exostoses reductions LARYNGOSCOPY  SURGEON:  Susy Frizzle, MD  ASSISTANTS: none  ANESTHESIA:   General   EBL:  0 ml  DRAINS: none  LOCAL MEDICATIONS USED:  None  SPECIMEN:  none  COUNTS:  Correct  PROCEDURE DETAILS: The patient was taken to the operating room and placed on the operating table in the supine position. Following induction of anesthesia for the dental procedure, I performed exam of the pharynx using the anesthesia laryngoscope. The upper suture line was intact (distal flap). No signs of wound breakdown or infection. The dental procedure was then performed.

## 2012-08-20 NOTE — Progress Notes (Addendum)
Received call from [facility] hospital rep, Corrie Dandy, this am.  Pt's daughter had called their office re: [facility] for her father, the patient.  They are not in network with the patient's insurance, [employer] Medicare.  Daughter was updated on this info via telephone and asked that we check the availability of [facility] and that she would decide between [facility] and [facility].  I spoke with [facility], [phone].  They are in network with [employer].  Their ability to service the patient would be dependent on what day he goes home so that they would be able to staff the request.    Daughter said that she would likely not get to the hospital before I left for the day today.  Will update assigned RN of this note so that she can pass along the info to daughter when she arrives.   SLP has taken the forms for the DME that pt will need that will assist him in speaking.    1358pm--Other database for CM notes that [facility] arrangements were already made with [facility].  Will update the RN on this.  Was likely done due to inability of [facility] to commit to fulfilling services.   1620pm--Spoke with daughter again.  She remembers that arrangements were made with [facility].  She is agreeable to this. She did state that she has spend the last several hours getting patient's insurance changed over to traditional Medicare and that this will be effective 09/07/2012. I spoke with [facility] rep, Hilda Lias, and let her know of this for their billing reasons.

## 2012-08-20 NOTE — Progress Notes (Signed)
Subjective: Feels OK  Objective: Vital signs in last 24 hours: Temp:  [97.5 F (36.4 C)-99 F (37.2 C)] 98.8 F (37.1 C) (03/14 1422) Pulse Rate:  [68-107] 68 (03/14 1422) Resp:  [16-20] 18 (03/14 1422) BP: (91-138)/(49-92) 101/54 mmHg (03/14 1422) SpO2:  [96 %-100 %] 100 % (03/14 1422) FiO2 (%):  [28 %] 28 % (03/14 1422)  Intake/Output from previous day: 03/13 0701 - 03/14 0700 In: 1849.2 [I.V.:1849.2] Out: 158 [Drains:8; Blood:150] Intake/Output this shift: Total I/O In: -  Out: 235 [Urine:225; Drains:10]  Operative sites: Look good. Chest donor site has no evidence of bleeding, infection, or healing problem. Drains functioning. Drainage thin and minimal. Less than 15 cc for 24 hours. Remaining drain removed today. .   Recent Labs  08/18/12 0850 08/19/12 0625  WBC 9.4 7.5  HGB 9.5* 9.2*  HCT 27.0* 26.0*  NA  --  136  K  --  4.3  CL  --  100  CO2  --  28  BUN  --  9  CREATININE  --  0.69    Studies/Results: No results found.  Assessment/Plan: Progressing.   LOS: 8 days    Etter Sjogren M 08/20/2012 2:38 PM

## 2012-08-20 NOTE — Progress Notes (Signed)
Pt's NGT came out while NT assisting pt to the [facility].  MD notified and new orders received.  [name] [place]

## 2012-08-21 ENCOUNTER — Encounter (HOSPITAL_COMMUNITY): Payer: Self-pay | Admitting: Dentistry

## 2012-08-21 LAB — GLUCOSE, CAPILLARY: Glucose-Capillary: 82 mg/dL (ref 70–99)

## 2012-08-21 MED ORDER — WHITE PETROLATUM GEL
Status: AC
  Administered 2012-08-21: 11:00:00
  Filled 2012-08-21: qty 5

## 2012-08-21 NOTE — Progress Notes (Signed)
Physical Therapy Treatment Patient Details Name: Cody [name] MRN: [id number] DOB: September 21, 1945 Today's Date: 08/21/2012 Time: 4098-1191 PT Time Calculation (min): 10 min  PT Assessment / Plan / Recommendation Comments on Treatment Session  Pt progressing well however remains to require 24/7 supervision/assist to be d/c'd home safely. Pt to benefit from use of RW to improve walker safety as well.     Follow Up Recommendations  Home health PT;Supervision/Assistance - 24 hour     Does the patient have the potential to tolerate intense rehabilitation     Barriers to Discharge        Equipment Recommendations  Rolling walker with 5" wheels    Recommendations for Other Services    Frequency Min 3X/week   Plan Discharge plan remains appropriate;Frequency remains appropriate    Precautions / Restrictions Precautions Precautions: Fall Restrictions Weight Bearing Restrictions: No   Pertinent Vitals/Pain Pt denies pain at this time    Mobility  Bed Mobility Bed Mobility: Not assessed Transfers Transfers: Sit to Stand;Stand to Sit Sit to Stand: 4: Min guard;From bed;With upper extremity assist Stand to Sit: 4: Min guard;To chair/3-in-1;With upper extremity assist Ambulation/Gait Ambulation/Gait Assistance: 4: Min assist;3: Mod assist Ambulation Distance (Feet): 200 Feet Assistive device: 1 person hand held assist Ambulation/Gait Assistance Details: initially pt minA but then progressed to modA via HHA due to onset of fatigue. Pt with increased trunk flex towards end of amb. discussed using a RW temporarily to build his endurance. Gait Pattern: Step-through pattern;Decreased stride length Gait velocity: slowed down t/o gait duration Stairs: No    Exercises     PT Diagnosis:    PT Problem List:   PT Treatment Interventions:     PT Goals Acute Rehab PT Goals PT Goal: Sit to Stand - Progress: Progressing toward goal PT Goal: Stand to Sit - Progress: Progressing toward  goal PT Goal: Ambulate - Progress: Progressing toward goal  Visit Information  Last PT Received On: 08/21/12 Assistance Needed: +1    Subjective Data  Subjective: Pt received sitting up in chair eager to amb. Pt non-verbal at this time but can shake head yes/no. Patient Stated Goal: return home   Cognition  Cognition Overall Cognitive Status: History of cognitive impairments - at baseline Arousal/Alertness: Awake/alert Orientation Level: Appears intact for tasks assessed Behavior During Session: [facility] for tasks performed    Balance     End of Session PT - End of Session Equipment Utilized During Treatment: Gait belt Activity Tolerance: Patient tolerated treatment well Patient left: in chair;with call bell/phone within reach;with family/visitor present Nurse Communication: Mobility status   GP     Marcene Brawn 08/21/2012, 5:00 PM  Lewis Shock, PT, DPT Pager #: [phone] Office #: [phone]

## 2012-08-21 NOTE — Progress Notes (Signed)
Doing well. No complaints. Last chest drain out. All looks healthy. Had him drink some water - no evidence of leak. Start liquid diet.

## 2012-08-22 MED ORDER — HYDROCODONE-ACETAMINOPHEN 7.5-325 MG/15ML PO SOLN: 15.0000 mL | ORAL | Status: DC | PRN

## 2012-08-22 MED ORDER — JEVITY 1.2 CAL PO LIQD
237.0000 mL | Freq: Four times a day (QID) | ORAL | Status: DC
  Administered 2012-08-22 – 2012-08-23 (×5): 237 mL via ORAL
  Filled 2012-08-22 (×7): qty 237

## 2012-08-22 MED ORDER — IBUPROFEN 100 MG/5ML PO SUSP
600.0000 mg | Freq: Four times a day (QID) | ORAL | Status: DC | PRN
  Filled 2012-08-22: qty 30

## 2012-08-22 NOTE — Progress Notes (Signed)
Doing great. Taking liquids very well. Staples removed. Healing nicely. Advance diet and anticipate discharge home tomorrow.

## 2012-08-23 ENCOUNTER — Other Ambulatory Visit: Payer: Self-pay | Admitting: Oncology

## 2012-08-23 MED ORDER — ENSURE COMPLETE PO LIQD
237.0000 mL | Freq: Four times a day (QID) | ORAL | Status: DC
  Administered 2012-08-23 – 2012-08-24 (×4): 237 mL via ORAL

## 2012-08-23 NOTE — Progress Notes (Signed)
NUTRITION FOLLOW UP   DOCUMENTATION CODES  Per approved criteria  -Underweight  -Severe malnutrition   Intervention:   - Ensure Complete QID. Each supplement provides 350 kcal and 13 grams of protein.    Nutrition Dx:   Inadequate oral intake now related to inability to eat as evidenced by NPO status. 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   Goal:   Pt to meet >/= 90% of estimated needs.    Monitor:   Toleration of diet advancement, po intake, weight trends   Assessment:   Pt admitted with laryngeal mass, cervical adenopathy and poor dentition. Pt s/p total laryngectomy with neck dissection and total dental extraction. Pt for eventual radiation therapy.   Pt with 15 lbs wt loss over the past 4 months per chart review. Per MD note 08/16/2012, pt was malnourished on admission and had lost a significant amount of weight prior to his cancer diagnosis.   Pt previously receiving nutrition support via NG feeding tube while pharynx healed from surgery. TF regimen was Jevity 1.2 @ 75 ml/hr providing 2160 kcal,100 grams of protein and 1453 ml of free water.   Pt's diet advanced 3/15 to liquids. Per MD note 3/16, pt taking liquids very well and diet advanced. Pt currently on full liquid diet.   Dietetic Intern and RD attempted to speak with pt.  Pt has difficulty communicating due to laryngectomy and limited writing skills. RN reports pt currently on full liquid diet and also drinking Jevity cans. Per RN and documentation, pt with adequate (75%) meal completion. Pt to return home with daughter.  RD contacted daughter and emphasized continuing nutrition supplements at home in addition to a full liquid diet.   Nutrition Focused Physical Exam:  Subcutaneous Fat:  Orbital Region: n/a  Upper Arm Region: severe Thoracic and Lumbar Region: n/a  Muscle:  Temple Region: moderate Clavicle Bone Region: severe Clavicle and  Acromion Bone Region: severe Scapular Bone Region: severe Dorsal Hand: severe Patellar Region: severe Anterior Thigh Region: severe Posterior Calf Region: severe  Edema: n/a   Pt meets criteria for severe malnutrition in the context of chronic illness based on severe subcutaneous fat loss and muscle wasting.    Height: Ht Readings from Last 1 Encounters:  08/13/12 5\' 8"  (1.727 m)    Weight Status:   Wt Readings from Last 1 Encounters:  08/15/12 122 lb 12.7 oz (55.7 kg)    Re-estimated needs:  Kcal: 1750 - 1950  Protein: 85-100 g  Fluid: 1.7-1.9 L   Skin: incision chest & neck  Diet Order: Full Liquid  No intake or output data in the 24 hours ending 08/23/12 1047  Last BM: 08/21/2012   Labs:   Recent Labs Lab 08/17/12 0625 08/18/12 0622 08/19/12 0625 08/20/12 0542  NA 138  --  136  --   K 4.3  --  4.3  --   CL 104  --  100  --   CO2 30  --  28  --   BUN 7  --  9  --   CREATININE 0.75  --  0.69  --   CALCIUM 8.2* 8.5 8.2* 8.3*  GLUCOSE 108*  --  109*  --     CBG (last 3)   Recent Labs  08/20/12 2201 08/21/12 0755 08/21/12 1214  GLUCAP 97 82 95    Scheduled Meds: . bacitracin  1 application Topical Q8H  . chlorhexidine  15 mL Mouth Rinse BID  . feeding supplement (JEVITY 1.2 CAL)  237 mL Oral QID    Continuous Infusions:   Belenda Cruise  Dietetic Intern Pager: [phone]

## 2012-08-23 NOTE — Progress Notes (Addendum)
Patient has been off aresol for a day now and needs no further checks from RT. Will continue to check PRN if needed. Patient never had a trach on a stoma and was being checked because of the aresol he was on.

## 2012-08-23 NOTE — Progress Notes (Addendum)
UR of chart completed.   HHRN/PT/OT already arranged with [facility]. Suction DME orders already ordered.  Rolling walker was recommended by acute PT but when I asked the patient, he reported not needing it.  Also said he didn't need the [facility].

## 2012-08-23 NOTE — Progress Notes (Signed)
Agree with above.  Austan Nicholl, MS RD LDN Clinical Inpatient Dietitian Pager: [phone] Weekend/After hours pager: [phone]  

## 2012-08-23 NOTE — Progress Notes (Signed)
SPEECH/LANGUAGE PATHOLOGY  Laryngectomee education material and internet resources provided directly to pt and to daughter via phone.  Spoke with rep at the Division of Services for Deaf and Hard-of-Hearing, who explained that they are no longer a resource for electrolarynges.  Updated pt and his daughter.  Pt for likely D/C today.  Note that PT/OT are being ordered from [facility] - please include SLP services given pt's diagnosis.  When [facility] services are no longer warranted, recommend SLP f/u at [facility] to address pt's communication.    Acute care SLP to sign - off; thanks.  Zylpha Poynor L. Samson Frederic, [place] CCC/SLP Pager [phone]

## 2012-08-23 NOTE — Discharge Summary (Signed)
Physician Discharge Summary  Patient ID: Cody [name] MRN: [id number] DOB/AGE: 07/21/1945 67 y.o.  Admit date: 08/12/2012 Discharge date: 08/23/2012  Admission Diagnoses:Laryngeal cancer  Discharge Diagnoses:  Active Problems:   * No active hospital problems. *   Discharged Condition: good  Hospital Course: no complications  Consults: SLP  Significant Diagnostic Studies: none  Treatments: surgery: Neck dissection, laryngectomy, pectoralis flap, full mouth extraction.  Discharge Exam: Blood pressure 108/71, pulse 90, temperature 98.4 F (36.9 C), temperature source Oral, resp. rate 18, height 5\' 8"  (1.727 m), weight 122 lb 12.7 oz (55.7 kg), SpO2 98.00%. PHYSICAL EXAM: Sroma clean and healthy, flap healing nicely.  Disposition: 01-Home or Self Care  Discharge Orders   Future Appointments Provider Department Dept Phone   09/02/2012 8:00 AM Krista Blue [facility] MEDICAL ONCOLOGY [phone]   09/02/2012 8:30 AM Exie Parody, MD [facility] MED[facility] [phone]   Future Orders Complete By Expires     Increase activity slowly  As directed         Medication List    STOP taking these medications       HYDROcodone-acetaminophen 7.5-325 mg/15 ml solution  Commonly known as:  HYCET           Follow-up Information   Follow up with Serena Colonel, MD In 1 week.   Contact information:   [address], [address] [place], [place] [address] [place] [address] [phone]       Signed: Serena Colonel 08/23/2012, 11:51 AM

## 2012-08-23 NOTE — Progress Notes (Signed)
Physical Therapy Treatment Patient Details Name: Cody [name] MRN: [id number] DOB: February 28, 1946 Today's Date: 08/23/2012 Time: 4098-1191 PT Time Calculation (min): 14 min  PT Assessment / Plan / Recommendation Comments on Treatment Session  Pt with progression in ability with less assist (no device) used today.  Pt with some unsteadiness upon fatigue.     Follow Up Recommendations  Home health PT;Supervision/Assistance - 24 hour     Does the patient have the potential to tolerate intense rehabilitation     Barriers to Discharge        Equipment Recommendations  Rolling walker with 5" wheels    Recommendations for Other Services    Frequency Min 3X/week   Plan Discharge plan remains appropriate    Precautions / Restrictions Precautions Precautions: Fall Restrictions Weight Bearing Restrictions: No   Pertinent Vitals/Pain Pt reports no significant pain; some soreness in neck (right side)    Mobility  Bed Mobility Bed Mobility: Not assessed Transfers Transfers: Sit to Stand;Stand to Sit Sit to Stand: 4: Min guard;From bed;With upper extremity assist Stand to Sit: 4: Min guard;To chair/3-in-1;With upper extremity assist Ambulation/Gait Ambulation/Gait Assistance: 4: Min guard Ambulation Distance (Feet): 200 Feet Assistive device: None Ambulation/Gait Assistance Details: Min guard for apporx 150 ft then started to sway with fatigue requiring min assist on occassion Gait Pattern: Step-through pattern;Decreased stride length Gait velocity: slowed down t/o gait duration Stairs: No      PT Goals Acute Rehab PT Goals PT Goal: Sit to Stand - Progress: Progressing toward goal PT Goal: Stand to Sit - Progress: Progressing toward goal PT Goal: Ambulate - Progress: Progressing toward goal  Visit Information  Last PT Received On: 08/23/12 Assistance Needed: +1    Subjective Data  Subjective: Pt agreeable to PT; looking forward to going home Patient Stated Goal: return  home   Cognition  Cognition Overall Cognitive Status: History of cognitive impairments - at baseline Arousal/Alertness: Awake/alert Orientation Level: Appears intact for tasks assessed Behavior During Session: [facility] for tasks performed       End of Session PT - End of Session Equipment Utilized During Treatment: Gait belt Activity Tolerance: Patient tolerated treatment well Patient left: in chair;with call bell/phone within reach;with family/visitor present Nurse Communication: Mobility status   GP     Fabio Asa 08/23/2012, 10:38 AM Charlotte Crumb, PT DPT  [phone]

## 2012-08-24 NOTE — Progress Notes (Signed)
Patient discharged to home, instructions given to daughter, verbalized understanding.

## 2012-08-30 ENCOUNTER — Encounter (HOSPITAL_COMMUNITY): Payer: Self-pay | Admitting: Dentistry

## 2012-08-30 ENCOUNTER — Ambulatory Visit (HOSPITAL_COMMUNITY): Payer: Self-pay | Admitting: Dentistry

## 2012-08-30 VITALS — BP 105/71 | HR 86 | Temp 97.8°F

## 2012-08-30 DIAGNOSIS — K08109 Complete loss of teeth, unspecified cause, unspecified class: Secondary | ICD-10-CM

## 2012-08-30 DIAGNOSIS — Z0189 Encounter for other specified special examinations: Secondary | ICD-10-CM

## 2012-08-30 DIAGNOSIS — K08409 Partial loss of teeth, unspecified cause, unspecified class: Secondary | ICD-10-CM

## 2012-08-30 DIAGNOSIS — C321 Malignant neoplasm of supraglottis: Secondary | ICD-10-CM

## 2012-08-30 NOTE — Progress Notes (Signed)
POST OPERATIVE NOTE:  08/30/2012 Cody [name] [id number]  VITALS: BP 105/71  Pulse 86  Temp(Src) 97.8 F (36.6 C) (Oral)  Patient is status post extraction of remaining teeth with alveoloplasty and pre-prosthetic surgery as indicated in the operating room on 08/19/2012.  SUBJECTIVE: Patient denies having any oral discomfort at this time. Patient indicates that it " feels different" without having any teeth.  EXAM: No sign of infection, heme, or ooze. Sutures are loosely intact. Normal saliva is noted.  Patient is healing in by generalized primary closure. Sutures removed today without complication.  ASSESSMENT: Post operative course is consistent with dental procedures performed in the operating room on 08/19/12.   PLAN: 1. Continue salt water rinses every two hours while awake as needed to aid healing. 2.  Brush tongue daily. 3. Maintain his nutrition as best able to maintain or increase his current weight. 4. Return to clinic as scheduled for periodic oral examination during and after radiation therapy as scheduled. 5. Call if problems arise before then.  6. Followup with Dr. Pollyann Kennedy today at 1 PM. 7. Call Radiation Oncology for Followup with Dr. Basilio Cairo for stimulation appointment.  Charlynne Pander, DDS

## 2012-08-30 NOTE — Patient Instructions (Signed)
PLAN: 1. Continue salt water rinses every two hours while awake as needed to aid healing. 2.  Brush tongue daily. 3. Maintain his nutrition as best able to maintain or increase his current weight. 4. Return to clinic as scheduled for periodic oral examination during and after radiation therapy as scheduled. 5. Call if problems arise before then.  6. Followup with Dr. Pollyann Kennedy today at 1 PM. 7. Call Radiation Oncology for Followup with Dr. Basilio Cairo for stimulation appointment.  Charlynne Pander, DDS

## 2012-09-01 NOTE — Patient Instructions (Addendum)
1.  Diagnosis:  Larynx cancer. 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   Without chemoradiation, the risk of recurrence can be as high as 50%. 2.  Recommendation:    *  Radiation, daily x 6-7 weeks.  *  Weekly chemo Cisplatin to increase efficacy of radiation. 3.  Potential side effects of chemo Cisplatin include but not limited to nausea vomiting, hair loss, fatigue, mouthsore, hearing impairment, kidney dysfunction, abnormal electrolytes, low blood count, risk of bleeding and infection.  4.  What to do:  *  See Dr. Squire (radiation oncologist) on 09/07/12 at 8:00am.  *  Attend chemo class.  *  Pick up nausea medications.  *  Return to clinic the day of chemo and radiation for follow up before starting therapy that day.   *  Attend Speech Pathology class.  

## 2012-09-02 ENCOUNTER — Other Ambulatory Visit: Payer: Medicare Other | Admitting: Lab

## 2012-09-02 ENCOUNTER — Ambulatory Visit: Payer: Medicare Other | Admitting: Oncology

## 2012-09-02 NOTE — Progress Notes (Signed)
Patient was no-show. Reminder letter sent. 

## 2012-09-04 ENCOUNTER — Telehealth: Payer: Self-pay | Admitting: Oncology

## 2012-09-04 NOTE — Telephone Encounter (Signed)
Talked to pt's daughter and gave her appt for 09/06/12 lab and MD, [facility] 3/27

## 2012-09-06 ENCOUNTER — Other Ambulatory Visit (HOSPITAL_BASED_OUTPATIENT_CLINIC_OR_DEPARTMENT_OTHER): Payer: Medicare Other | Admitting: Lab

## 2012-09-06 ENCOUNTER — Encounter: Payer: Self-pay | Admitting: Oncology

## 2012-09-06 ENCOUNTER — Ambulatory Visit (HOSPITAL_BASED_OUTPATIENT_CLINIC_OR_DEPARTMENT_OTHER): Payer: Medicare Other | Admitting: Oncology

## 2012-09-06 VITALS — BP 103/68 | HR 91 | Temp 97.8°F | Resp 20 | Ht 68.0 in | Wt 122.6 lb

## 2012-09-06 DIAGNOSIS — C321 Malignant neoplasm of supraglottis: Secondary | ICD-10-CM

## 2012-09-06 LAB — COMPREHENSIVE METABOLIC PANEL (CC13)
ALT: 12 U/L (ref 0–55)
AST: 17 U/L (ref 5–34)
Alkaline Phosphatase: 79 U/L (ref 40–150)
Creatinine: 1 mg/dL (ref 0.7–1.3)
Total Bilirubin: 0.42 mg/dL (ref 0.20–1.20)

## 2012-09-06 LAB — CBC WITH DIFFERENTIAL/PLATELET
BASO%: 0.5 % (ref 0.0–2.0)
EOS%: 3.2 % (ref 0.0–7.0)
HCT: 32.6 % — ABNORMAL LOW (ref 38.4–49.9)
LYMPH%: 30.6 % (ref 14.0–49.0)
MCH: 31 pg (ref 27.2–33.4)
MCHC: 33.2 g/dL (ref 32.0–36.0)
NEUT%: 54.3 % (ref 39.0–75.0)
Platelets: 363 10*3/uL (ref 140–400)
RBC: 3.49 10*6/uL — ABNORMAL LOW (ref 4.20–5.82)

## 2012-09-06 MED ORDER — ONDANSETRON HCL 8 MG PO TABS: 8.0000 mg | ORAL_TABLET | Freq: Two times a day (BID) | ORAL | Status: DC | PRN

## 2012-09-06 MED ORDER — LORAZEPAM 0.5 MG PO TABS: 0.5000 mg | ORAL_TABLET | Freq: Four times a day (QID) | ORAL | Status: DC | PRN

## 2012-09-06 MED ORDER — PROCHLORPERAZINE MALEATE 10 MG PO TABS: 10.0000 mg | ORAL_TABLET | Freq: Four times a day (QID) | ORAL | Status: DC | PRN

## 2012-09-06 NOTE — Patient Instructions (Addendum)
1.  Diagnosis:  Larynx cancer. 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   Without chemoradiation, the risk of recurrence can be as high as 50%. 2.  Recommendation:    *  Radiation, daily x 6-7 weeks.  *  Weekly chemo Cisplatin to increase efficacy of radiation. 3.  Potential side effects of chemo Cisplatin include but not limited to nausea vomiting, hair loss, fatigue, mouthsore, hearing impairment, kidney dysfunction, abnormal electrolytes, low blood count, risk of bleeding and infection.  4.  What to do:  *  See Dr. Basilio Cairo (radiation oncologist) on 09/07/12 at 8:00am.  *  Attend chemo class.  *  Pick up nausea medications.  *  Return to clinic the day of chemo and radiation for follow up before starting therapy that day.   *  Attend Speech Pathology class.

## 2012-09-06 NOTE — Progress Notes (Signed)
Patient and his daughter came in. He is about to change to medicare. I gave her an EPP and advised if approved can get the 400.00 grant. I did advise her to make sure when they call about the medicare and part D tell them about the treatment he will be getting to make sure covered.

## 2012-09-07 ENCOUNTER — Ambulatory Visit
Admission: RE | Admit: 2012-09-07 | Discharge: 2012-09-07 | Disposition: A | Discharge status: Home / Self Care | Payer: Medicare Other | Source: Ambulatory Visit | Attending: Radiation Oncology | Admitting: Radiation Oncology

## 2012-09-07 ENCOUNTER — Telehealth: Payer: Self-pay | Admitting: *Deleted

## 2012-09-07 ENCOUNTER — Encounter: Payer: Self-pay | Admitting: Radiation Oncology

## 2012-09-07 ENCOUNTER — Other Ambulatory Visit: Payer: Self-pay | Admitting: Certified Registered Nurse Anesthetist

## 2012-09-07 VITALS — BP 112/76 | HR 90 | Temp 97.4°F | Resp 20 | Ht 68.0 in | Wt 121.7 lb

## 2012-09-07 DIAGNOSIS — C321 Malignant neoplasm of supraglottis: Secondary | ICD-10-CM

## 2012-09-07 DIAGNOSIS — C801 Malignant (primary) neoplasm, unspecified: Secondary | ICD-10-CM | POA: Insufficient documentation

## 2012-09-07 DIAGNOSIS — Z9089 Acquired absence of other organs: Secondary | ICD-10-CM | POA: Insufficient documentation

## 2012-09-07 DIAGNOSIS — Z79899 Other long term (current) drug therapy: Secondary | ICD-10-CM | POA: Insufficient documentation

## 2012-09-07 HISTORY — DX: Malignant (primary) neoplasm, unspecified: C80.1

## 2012-09-07 NOTE — Progress Notes (Signed)
[facility]  Telephone:([phone]) [phone] [phone]   OFFICE PROGRESS NOTE   Cc:  Gwynneth Aliment, MD  DIAGNOSIS:  Newly diagnosed stage IVA laryngeal cancer.   PAST THERAPY: total laryngectomy and right neck dissection on 08/13/2012.    CURRENT THERAPY:  Pending start of adjuvant chemoradiation.   INTERVAL HISTORY: Cody [name] 67 y.o. male returns for regular follow up with his son and daughter.  He reported fatigue.  He is undergoing home PT/OT.  He is independent of personal hygiene activities.  However, he spends most of his awake time at rest if not working with PT/OT.  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  He thinks that OT has helped in improving his range of motion of his neck.  He has trach stoma that is well healed without purulent discharge. His children reported that he is taking in at least 2000 cal a day by mouth.  His appetite now is better than when he was first diagnosed with head/neck cancer.  He denied SOB, chest pain, abdominal pain, bleeding symptoms.  The rest of the 14-point review of system was negative.   Past Medical History  Diagnosis Date  . TB (pulmonary tuberculosis) 1998  . Learning disabilities   . Cough   . Difficulty swallowing   . Pain on swallowing   . Abnormal weight loss documented 07/07/12     20 lbs  . Referred otalgia     Right Ear  . 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 Arthritis   . 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 Colonoscopy  2006    neg.   . Direct laryngoscopy  07/09/11    Epiglottic mass  . Direct laryngoscopy  07/08/2012    Procedure: DIRECT LARYNGOSCOPY;  Surgeon: Serena Colonel, MD;  Location: [facility] OR;  Service: ENT;  Laterality: N/A;  . Esophagoscopy  07/08/2012    Procedure: ESOPHAGOSCOPY;  Surgeon: Serena Colonel, MD;  Location: [facility] OR;  Service: ENT;  Laterality: N/A;  .  Radical neck dissection Bilateral 08/13/2012    Procedure: RADICAL NECK DISSECTION;  Surgeon: Serena Colonel, MD;  Location: [facility] OR;  Service: ENT;  Laterality: Bilateral;  . Tracheostomy tube placement N/A 08/13/2012    Procedure: TRACHEOSTOMY;  Surgeon: Serena Colonel, MD;  Location: [facility] OR;  Service: ENT;  Laterality: N/A;  . Pectoralis flap N/A 08/13/2012    Procedure: RIGHT PECTORALIS MYOCUTANEOUS FLAP to LARYNX;  Surgeon: Etter Sjogren, MD;  Location: [facility] OR;  Service: Plastics;  Laterality: N/A;  . Multiple extractions with alveoloplasty N/A 08/19/2012    Procedure: Extraction of tooth #'s 1,2,3,4,5,6,10,11,12,13,14,16,17,18,19,21,22,23, 24,25,26,27,28,29,30,31,32 with alveoloplasty and bilateral mandibular buccal exostoses reductions;  Surgeon: Charlynne Pander, DDS;  Location: [facility] OR;  Service: Oral Surgery;  Laterality: N/A;  . Laryngoscopy N/A 08/19/2012    Procedure: LARYNGOSCOPY;  Surgeon: Serena Colonel, MD;  Location: [facility] OR;  Service: ENT;  Laterality: N/A;  Anesthesia's Laryngoscope    Current Outpatient Prescriptions  Medication Sig Dispense Refill  . LORazepam (ATIVAN) 0.5 MG tablet Take 1 tablet (0.5 mg total) by mouth every 6 (six) hours as needed (Nausea or vomiting).  30 tablet  2  . ondansetron (ZOFRAN) 8 MG tablet Take 1 tablet (8 mg total) by mouth 2 (two) times daily as needed. Take two times a day as needed for  nausea or vomiting starting on the third day after chemotherapy.  30 tablet  1  . prochlorperazine (COMPAZINE) 10 MG tablet Take 1 tablet (10 mg total) by mouth every 6 (six) hours as needed (Nausea or vomiting).  30 tablet  1   No current facility-administered medications for this visit.    ALLERGIES:  has No Known Allergies.  REVIEW OF SYSTEMS:  The rest of the 14-point review of system was negative.   Filed Vitals:   09/06/12 1150  BP: 103/68  Pulse: 91  Temp: 97.8 F (36.6 C)  Resp: 20   Wt Readings from Last 3 Encounters:  09/06/12 122 lb 9.6 oz (55.611 kg)    08/15/12 122 lb 12.7 oz (55.7 kg)  08/15/12 122 lb 12.7 oz (55.7 kg)   ECOG Performance status: 1-2  PHYSICAL EXAMINATION:   General:  Thin-appearing man,  in no acute distress.  Eyes:  no scleral icterus.  ENT:  There were no oropharyngeal lesions.  Neck was without thyromegaly.  Right neck dissection scar and trach stoma were well healed without erythema, purulent discharge.  Lymphatics:  Negative cervical, supraclavicular or axillary adenopathy.  Respiratory: lungs were clear bilaterally without wheezing or crackles.  Cardiovascular:  Regular rate and rhythm, S1/S2, without murmur, rub or gallop.  There was no pedal edema.  GI:  abdomen was soft, flat, nontender, nondistended, without organomegaly.  Muscoloskeletal:  no spinal tenderness of palpation of vertebral spine.  Skin exam was without echymosis, petichae.  Neuro exam was nonfocal.  Patient was able to get on and off exam table without assistance.  Gait was normal.  Patient was alerted and oriented.  Attention was good.   Language was appropriate.  Mood was normal without depression.  Speech was not pressured.  Thought content was not tangential.         LABORATORY/RADIOLOGY DATA:  Lab Results  Component Value Date   WBC 6.2 09/06/2012   HGB 10.8* 09/06/2012   HCT 32.6* 09/06/2012   PLT 363 09/06/2012   GLUCOSE 83 09/06/2012   ALKPHOS 79 09/06/2012   ALT 12 09/06/2012   AST 17 09/06/2012   NA 138 09/06/2012   K 4.3 09/06/2012   CL 101 09/06/2012   CREATININE 1.0 09/06/2012   BUN 15.0 09/06/2012   CO2 29 09/06/2012     ASSESSMENT AND PLAN:   1.  Diagnosis:  Larynx cancer. 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   Without chemoradiation, the risk of recurrence can be as high as 50%. 2.  Recommendation:    *  Radiation, daily x 6-7 weeks.  *  Weekly chemo Cisplatin to increase efficacy of radiation. 3.  Potential side effects of chemo Cisplatin include but not limited to nausea  vomiting, hair loss, fatigue, mouthsore, hearing impairment, kidney dysfunction, abnormal electrolytes, low blood count, risk of bleeding and infection.  4.  What to do:  *  See Dr. Basilio Cairo (radiation oncologist) on 09/07/12 at 8:00am.  *  Attend chemo class.  *  Pick up nausea medications.  *  Return to clinic the day of chemo and radiation for follow up before starting therapy that day.   *  Attend Speech Pathology class.   *  I will defer to Dr. Basilio Cairo to decide whether he will need PEG tube placement or not.    Cody [name] and his children expressed informed understanding and wished to proceed with chemo therapy as recommended.   The length  of time of the face-to-face encounter was 45 minutes. More than 50% of time was spent counseling and coordination of care.

## 2012-09-07 NOTE — Progress Notes (Signed)
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  He underwent laryngectomy and bilateral neck dissection on 08/13/12. Path report shows negative margins, +ECE, + PNI, no LVSI, 10/25 bilateral nodes +:   Diagnosis 1. Soft tissue, biopsy, vallecula margin - FINAL DESIGNATED MARGIN NEGATIVE FOR CARCINOMA. 2. Larynx, laryngectomy + LN, with bilateral neck dissections - INVASIVE SQUAMOUS CELL CARCINOMA, POORLY DIFFERENTIATED SPANNING 4.3 CM. - CARCINOMA IS SUPRAGLOTTIC AND INVOLVES EPIGLOTTIC CARTILAGE. - METASTATIC CARCINOMA IN 10 OF 25 LYMPH NODES (10/25), WITH MULTIPLE NODES SHOWING EXTRACAPSULAR EXTENSION. - THE SURGICAL RESECTION MARGINS ARE NEGATIVE FOR CARCINOMA. - SEE ONCOLOGY TABLE BELOW. Microscopic Comment 2. ONCOLOGY TABLE - LARYNX, PARTIAL OR TOTAL 1. Specimen, including laterality if applicable: Larynx and bilateral neck soft tissues 2. Procedure: Resection 3. Tumor site: Right supraglottic 4. Tumor focality: Unifocal 5. Maximum tumor size (cm): 4.3 cm (gross measurement) 6. Histologic type: Squamous cell carcinoma 7. Grade: Poorly differentiated (high grade) 8. Margins: Negative for carcinoma 9. Vocal cord fixation: Not identified 10. Local or midline extension: Not identified 11. Microscopic tumor extension: Squamous cell carcinoma involves epiglottic cartilage 12. Lymph-vascular invasion: Not identified 13. Perineural invasion: Present 14. Lymph nodes: # examined: 25: # positive: 10; extracapsular extension: yes, multiple Level I (submandibular/submental): 1 (left) Level II (upper jugular): 3 (right) Level III (mid jugular): 6 (1 right and 5 left) 15. TNM  code: pT3, pN2c (JBK:kh 08-17-12) 1 of 3 FINAL for TYNER, CODNER [phone]) Microscopic Comment(continued) Pecola Leisure MD Pathologist, Electronic Signature (Case signed 08/17/2012) Intraoperative Diagnosis RAPID INTRAOPERATIVE CONSULT, VALLECULA, EXCISIONAL BIOPSY: FROZEN SECTIONS A AND B. MARGIN OPPOSITE SUTURES - SQUAMOUS CELL CARCINOMA. FROZEN SECTIONS C AND D. MARGIN WITH SUTURES (NEW TRUE MARGIN PER DR. Pollyann Kennedy) - NO CARCINOMA IDENTIFIED. (JBK)                 He  is with his daughter. He denies pain or dysphagia or nausea. He does exercises at home with PT and has [facility]. Will start chemotherapy with Dr. Gaylyn Rong.  No PEG tube thus far. Has seen Verdie Mosher and Vernell Leep.   ALLERGIES:  has No Known Allergies.  Meds: Current Outpatient Prescriptions  Medication Sig Dispense Refill  . LORazepam (ATIVAN) 0.5 MG tablet Take 1 tablet (0.5 mg total) by mouth every 6 (six) hours as needed (Nausea or vomiting).  30 tablet  2  . ondansetron (ZOFRAN) 8 MG tablet Take 1 tablet (8 mg total) by mouth 2 (two) times daily as needed. Take two times a day as needed for nausea or vomiting starting on the third day after chemotherapy.  30 tablet  1  . prochlorperazine (COMPAZINE) 10 MG tablet Take 1 tablet (10 mg total) by mouth every 6 (six) hours as needed (Nausea or vomiting).  30 tablet  1   No current facility-administered medications for this encounter.    Physical Findings: The patient is in no acute distress. Patient is alert and oriented.  height is 5\' 8"  (1.727 m) and weight is 121 lb 11.2 oz (55.203 kg). His temperature is 97.4 F (36.3 C). His blood pressure is 112/76 and his pulse is 90. His respiration is 20 and oxygen saturation is 99%. Marland Kitchen  Stoma non-infected, minimal crusting.  Neck with post operative changes. Healing well. No oral lesions.  Lab Findings: Lab Results  Component Value Date   WBC 6.2 09/06/2012   HGB 10.8* 09/06/2012   HCT 32.6* 09/06/2012   MCV 93.4  09/06/2012   PLT 363 09/06/2012    CMP     Component Value Date/Time   NA 138 09/06/2012 1136   NA 136 08/19/2012 0625   K 4.3 09/06/2012 1136   K 4.3 08/19/2012 0625   CL 101 09/06/2012 1136   CL 100 08/19/2012 0625   CO2 29 09/06/2012 1136   CO2 28 08/19/2012 0625   GLUCOSE 83 09/06/2012 1136   GLUCOSE 109* 08/19/2012 0625   BUN 15.0 09/06/2012 1136   BUN 9 08/19/2012 0625   CREATININE 1.0 09/06/2012 1136   CREATININE 0.69 08/19/2012 0625   CALCIUM 9.4 09/06/2012 1136   CALCIUM 8.3* 08/20/2012 0542   PROT 8.2 09/06/2012 1136   PROT 6.9 08/19/2012 0625   ALBUMIN 2.8* 09/06/2012 1136   ALBUMIN 2.4* 08/19/2012 0625   AST 17 09/06/2012 1136   AST 25 08/19/2012 0625   ALT 12 09/06/2012 1136   ALT 20 08/19/2012 0625   ALKPHOS 79 09/06/2012 1136   ALKPHOS 57 08/19/2012 0625   BILITOT 0.42 09/06/2012 1136   BILITOT 0.6 08/19/2012 0625   GFRNONAA >90 08/19/2012 0625   GFRAA >90 08/19/2012 2956     Radiographic Findings: No results found.  Impression/Plan:    1) Head and Neck Cancer Status: post-operative, pending ChRT  2) Nutritional Status: - PEG tube: recommended due to side effects of ChRT. Pt agreeable to referral for this. Continue f/u with Lesle Reek and Baldo Ash.  3) Risk Factors: The patient has been educated about risk factors including alcohol and tobacco abuse; they understand that avoidance of alcohol and tobacco is important to prevent recurrences as well as other cancers  4) Swallowing: Good with softer foods/ shakes  5) Dental: s/p full mouth extractions - cleared by Dr. Kristin Bruins  6) Social: No active social issues to address at this time  7) Other: CT simulation tomorrow, plan to start RT  approx a week and a half later (April 14, with chemotherapy.) Anticipate 60Gy in 30 fractions to bilateral neck and surgical bed.  It was a pleasure meeting the patient today. We discussed the risks, benefits, and side effects of radiotherapy. We talked in detail about acute and late effects. He  understands that some of the most bothersome acute effects will be significant soreness of the throat, changes in taste, changes in salivary function, and fatigue. We talked about late effects which include but are not necessarily limited to dysphagia, hypothyroidism, dry mouth, trismus, and neck edema. No guarantees of treatment were given. A consent form was signed and placed in the patient's medical record. The patient is enthusiastic about proceeding with treatment. I look forward to participating in the patient's care.  The patient was offered enrollment on our single institutional trial investigating open-faced vs close-face head/shoulder masks used to immobilize patients during head and neck radiotherapy. The patient has elected to enroll on this trial.  In regards to the trial, the patient has voluntarily signed copies of the consent forms and all trial related questions were answered.  I spent 30 minutes face to face with the patient and more than 50% of that time was spent in counseling and/or coordination of care. _____________________________________   Lonie Peak, MD

## 2012-09-07 NOTE — Progress Notes (Signed)
Please see the Nurse Progress Note in the MD Initial Consult Encounter for this patient. 

## 2012-09-07 NOTE — Progress Notes (Addendum)
FUNC laryngeal invasive squamous cell c with cervical lymoph node mets radiacal neck dissection 08/13/12 Patient non speking, mouths answers, yes or no,  In stoma  Patient , coughed up white pheglm patient denys pain, eating softer foods, blended down, no difficulty swallowing, no nausea,  Has [facility] 2x week, therapist, slight fatigue improving,  Saw Dr.Ha yesterday, drinks 3 cans ensure plus daily as well, has met with Verdie Mosher, speech therapist and Britta Mccreedy Neff,dietician as well  8:27 AM

## 2012-09-07 NOTE — Telephone Encounter (Signed)
Inetta Fermo called from Interventional radiology , patient scheduled appt is 09/14/12 to be there at 0730am, will go to short stay around 1030, stop by radiology tomorrow 09/08/12 to pick up barium and instructions,will let patient know 2:42 PM

## 2012-09-08 ENCOUNTER — Encounter (HOSPITAL_COMMUNITY): Payer: Self-pay | Admitting: Pharmacy Technician

## 2012-09-08 ENCOUNTER — Ambulatory Visit
Admission: RE | Admit: 2012-09-08 | Discharge: 2012-09-08 | Disposition: A | Discharge status: Home / Self Care | Payer: Medicare Other | Source: Ambulatory Visit | Attending: Radiation Oncology | Admitting: Radiation Oncology

## 2012-09-08 VITALS — BP 112/65 | HR 90 | Temp 97.9°F | Wt 122.1 lb

## 2012-09-08 DIAGNOSIS — K209 Esophagitis, unspecified without bleeding: Secondary | ICD-10-CM | POA: Insufficient documentation

## 2012-09-08 DIAGNOSIS — C321 Malignant neoplasm of supraglottis: Secondary | ICD-10-CM

## 2012-09-08 DIAGNOSIS — M25519 Pain in unspecified shoulder: Secondary | ICD-10-CM | POA: Insufficient documentation

## 2012-09-08 DIAGNOSIS — L819 Disorder of pigmentation, unspecified: Secondary | ICD-10-CM | POA: Insufficient documentation

## 2012-09-08 DIAGNOSIS — Z510 Encounter for antineoplastic radiation therapy: Secondary | ICD-10-CM | POA: Insufficient documentation

## 2012-09-08 DIAGNOSIS — J029 Acute pharyngitis, unspecified: Secondary | ICD-10-CM | POA: Insufficient documentation

## 2012-09-08 MED ORDER — SODIUM CHLORIDE 0.9 % IJ SOLN
10.0000 mL | Freq: Once | INTRAMUSCULAR | Status: AC
  Administered 2012-09-08: 10 mL via INTRAVENOUS

## 2012-09-08 NOTE — Progress Notes (Signed)
IV started without any difficulty with brisk blood return in left arm below antecubital space.  Cody [name] denied any pain during flushing and site with any swelling.   Taken to simulation at 1002.  Mr Digioia's daughter present since he is unable to articular post Laryngectomy.

## 2012-09-08 NOTE — Addendum Note (Signed)
Encounter addended by: Glennie Hawk, RN on: 09/08/2012 11:05 AM<BR>     Documentation filed: Notes Section

## 2012-09-08 NOTE — Progress Notes (Signed)
Pt brought to nursing per Victorino Dike, RT for IV removal s/p completion of ct sim. IV in RAC d/c w/o difficulty, pt tol well. Pt's daughter brought to nursing, and  Victorino Dike, [place] gave daughter pt's schedule. Offered to get wheelchair for pt; pt refused.

## 2012-09-08 NOTE — Addendum Note (Signed)
Encounter addended by: Delynn Flavin, RN on: 09/08/2012  9:27 AM<BR>     Documentation filed: Charges VN

## 2012-09-08 NOTE — Progress Notes (Signed)
Simulation, IMRT treatment planning, and Special treatment procedure note   outpatient  Diagnosis: head and neck cancer, post-op supraglottic  The patient was taken to the CT simulator and laid in the supine position on the table. An Aquaplast head and shoulder mask was custom fitted to the patient's anatomy. High-resolution CT axial imaging was obtained of the head and neck with contrast. I verified that the quality of the imaging is good for treatment planning. 1 Medically Necessary Treatment Device was fabricated and supervised by me: Aquaplast mask.  Treatment planning note I plan to treat the patient with helical Tomotherapy, IMRT. I plan to treat the patient's tumorbed/stoma and bilateral neck nodes. I plan to treat to a total dose of 60 Gray in 30  fractions   IMRT planning Note  IMRT is an important modality to deliver adequate dose to the patient's at risk tissues while sparing the patient's normal structures, including the: esophagus, parotid tissue, mandible, brain stem, spinal cord, oral cavity, brachial plexus.  This justifies the use of IMRT in the patient's treatment.   Special Treatment Procedure Note:  The patient will be receiving chemotherapy concurrently. Chemotherapy heightens the risk of side effects. I have considered this during the patient's treatment planning process and will monitor the patient accordingly for side effects on a weekly basis. Concurrent chemotherapy increases the complexity of this patient's treatment and therefore this constitutes a special treatment procedure.  -----------------------------------  Lonie Peak, MD

## 2012-09-09 ENCOUNTER — Other Ambulatory Visit: Payer: Self-pay

## 2012-09-09 ENCOUNTER — Telehealth: Payer: Self-pay | Admitting: Oncology

## 2012-09-09 ENCOUNTER — Telehealth: Payer: Self-pay | Admitting: *Deleted

## 2012-09-09 NOTE — Telephone Encounter (Signed)
No additional note

## 2012-09-09 NOTE — Telephone Encounter (Signed)
Pt aware of appt on 09/20/12 for lab,MD and chemo

## 2012-09-10 ENCOUNTER — Other Ambulatory Visit: Payer: Self-pay | Admitting: Radiology

## 2012-09-13 ENCOUNTER — Other Ambulatory Visit: Payer: Self-pay | Admitting: Radiology

## 2012-09-14 ENCOUNTER — Ambulatory Visit (HOSPITAL_COMMUNITY)
Admission: RE | Admit: 2012-09-14 | Discharge: 2012-09-14 | Disposition: A | Discharge status: Home / Self Care | Payer: Medicare Other | Source: Ambulatory Visit | Attending: Radiation Oncology | Admitting: Radiation Oncology

## 2012-09-14 ENCOUNTER — Encounter (HOSPITAL_COMMUNITY): Payer: Self-pay

## 2012-09-14 DIAGNOSIS — Z8611 Personal history of tuberculosis: Secondary | ICD-10-CM | POA: Insufficient documentation

## 2012-09-14 DIAGNOSIS — C329 Malignant neoplasm of larynx, unspecified: Secondary | ICD-10-CM | POA: Insufficient documentation

## 2012-09-14 DIAGNOSIS — Z79899 Other long term (current) drug therapy: Secondary | ICD-10-CM | POA: Insufficient documentation

## 2012-09-14 DIAGNOSIS — C321 Malignant neoplasm of supraglottis: Secondary | ICD-10-CM

## 2012-09-14 LAB — GLUCOSE, CAPILLARY
Glucose-Capillary: 66 mg/dL — ABNORMAL LOW (ref 70–99)
Glucose-Capillary: 83 mg/dL (ref 70–99)

## 2012-09-14 LAB — CBC
HCT: 33.7 % — ABNORMAL LOW (ref 39.0–52.0)
Hemoglobin: 11.3 g/dL — ABNORMAL LOW (ref 13.0–17.0)
MCH: 31.3 pg (ref 26.0–34.0)
MCHC: 33.5 g/dL (ref 30.0–36.0)
MCV: 93.4 fL (ref 78.0–100.0)

## 2012-09-14 LAB — PROTIME-INR: INR: 1.08 (ref 0.00–1.49)

## 2012-09-14 MED ORDER — MORPHINE SULFATE 2 MG/ML IJ SOLN
2.0000 mg | Freq: Once | INTRAMUSCULAR | Status: AC
  Administered 2012-09-14 (×2): 1 mg via INTRAVENOUS
  Filled 2012-09-14: qty 1

## 2012-09-14 MED ORDER — IOHEXOL 300 MG/ML  SOLN
10.0000 mL | Freq: Once | INTRAMUSCULAR | Status: AC | PRN
  Administered 2012-09-14: 10 mL

## 2012-09-14 MED ORDER — SODIUM CHLORIDE 0.9 % IV SOLN: INTRAVENOUS | Status: DC

## 2012-09-14 MED ORDER — GLUCAGON HCL (RDNA) 1 MG IJ SOLR
INTRAMUSCULAR | Status: AC
  Filled 2012-09-14: qty 1

## 2012-09-14 MED ORDER — MORPHINE SULFATE 2 MG/ML IJ SOLN: 1.0000 mg | Freq: Once | INTRAMUSCULAR | Status: DC

## 2012-09-14 MED ORDER — FENTANYL CITRATE 0.05 MG/ML IJ SOLN
INTRAMUSCULAR | Status: AC | PRN
  Administered 2012-09-14: 50 ug via INTRAVENOUS
  Administered 2012-09-14: 25 ug via INTRAVENOUS

## 2012-09-14 MED ORDER — MIDAZOLAM HCL 2 MG/2ML IJ SOLN
INTRAMUSCULAR | Status: AC
  Filled 2012-09-14: qty 6

## 2012-09-14 MED ORDER — FENTANYL CITRATE 0.05 MG/ML IJ SOLN
INTRAMUSCULAR | Status: AC
  Filled 2012-09-14: qty 6

## 2012-09-14 MED ORDER — GLUCAGON HCL (RDNA) 1 MG IJ SOLR
INTRAMUSCULAR | Status: AC | PRN
  Administered 2012-09-14: .5 mg via INTRAVENOUS

## 2012-09-14 MED ORDER — MORPHINE SULFATE 15 MG PO TABS
7.5000 mg | ORAL_TABLET | Freq: Once | ORAL | Status: AC
  Administered 2012-09-14: 7.5 mg via ORAL
  Filled 2012-09-14: qty 1

## 2012-09-14 MED ORDER — LIDOCAINE HCL 1 % IJ SOLN
INTRAMUSCULAR | Status: AC
  Filled 2012-09-14: qty 20

## 2012-09-14 MED ORDER — MIDAZOLAM HCL 2 MG/2ML IJ SOLN
INTRAMUSCULAR | Status: AC | PRN
  Administered 2012-09-14: 0.5 mg via INTRAVENOUS
  Administered 2012-09-14: 1 mg via INTRAVENOUS

## 2012-09-14 MED ORDER — CEFAZOLIN SODIUM 1-5 GM-% IV SOLN
1.0000 g | INTRAVENOUS | Status: AC
  Administered 2012-09-14: 1 g via INTRAVENOUS
  Filled 2012-09-14: qty 50

## 2012-09-14 NOTE — H&P (Signed)
Cody [name] is an 67 y.o. male.   Chief Complaint: "I'm getting a PEG" HPI: Patient with history of laryngeal carcinoma presents today for placement of a percutaneous gastrostomy tube prior to planned chemoradiation.  Past Medical History  Diagnosis Date  . TB (pulmonary tuberculosis) 1998  . Learning disabilities   . Cough   . Difficulty swallowing   . Pain on swallowing   . Abnormal weight loss documented 07/07/12     20 lbs  . Referred otalgia     Right Ear  . 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 Arthritis   . 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 Colonoscopy  2006    neg.   . Direct laryngoscopy  07/09/11    Epiglottic mass  . Direct laryngoscopy  07/08/2012    Procedure: DIRECT LARYNGOSCOPY;  Surgeon: Serena Colonel, MD;  Location: [facility] OR;  Service: ENT;  Laterality: N/A;  . Esophagoscopy  07/08/2012    Procedure: ESOPHAGOSCOPY;  Surgeon: Serena Colonel, MD;  Location: [facility] OR;  Service: ENT;  Laterality: N/A;  . Radical neck dissection Bilateral 08/13/2012    Procedure: RADICAL NECK DISSECTION;  Surgeon: Serena Colonel, MD;  Location: [facility] OR;  Service: ENT;  Laterality: Bilateral;  . Tracheostomy tube placement N/A 08/13/2012    Procedure: TRACHEOSTOMY;  Surgeon: Serena Colonel, MD;  Location: [facility] OR;  Service: ENT;  Laterality: N/A;  . Pectoralis flap N/A 08/13/2012    Procedure: RIGHT PECTORALIS MYOCUTANEOUS FLAP to LARYNX;  Surgeon: Etter Sjogren, MD;  Location: [facility] OR;  Service: Plastics;  Laterality: N/A;  . Multiple extractions with alveoloplasty N/A 08/19/2012    Procedure: Extraction of tooth #'s 1,2,3,4,5,6,10,11,12,13,14,16,17,18,19,21,22,23, 24,25,26,27,28,29,30,31,32 with alveoloplasty and bilateral mandibular buccal exostoses reductions;  Surgeon: Charlynne Pander, DDS;  Location: [facility] OR;  Service: Oral Surgery;  Laterality: N/A;  . Laryngoscopy N/A 08/19/2012   Procedure: LARYNGOSCOPY;  Surgeon: Serena Colonel, MD;  Location: [facility] OR;  Service: ENT;  Laterality: N/A;  Anesthesia's Laryngoscope    Family History  Problem Relation Age of Onset  . Vascular Disease Mother   . Cancer Father     unk cancer  . Prostate cancer Brother    Social History:  reports that he quit smoking about 2 months ago. His smoking use included Cigarettes. He has a 10 pack-year smoking history. He has never used smokeless tobacco. He reports that  drinks alcohol. He reports that he does not use illicit drugs.  Allergies: No Known Allergies  Current outpatient prescriptions:LORazepam (ATIVAN) 0.5 MG tablet, Take 0.5 mg by mouth every 6 (six) hours as needed (nausea or vomiting)., Disp: , Rfl: ;  ondansetron (ZOFRAN) 8 MG tablet, Take 8 mg by mouth every 12 (twelve) hours as needed for nausea., Disp: , Rfl: ;  prochlorperazine (COMPAZINE) 10 MG tablet, Take 10 mg by mouth every 6 (six) hours as needed (nausea)., Disp: , Rfl:  Current facility-administered medications:0.9 %  sodium chloride infusion, , Intravenous, Continuous, D Kevin Hydee Fleece, PA-C;  ceFAZolin (ANCEF) IVPB 1 g/50 mL premix, 1 g, Intravenous, On Call, D Jeananne Rama, PA-C   Results for orders placed during the hospital encounter of 09/14/12 (from the past 48 hour(s))  APTT     Status: None   Collection Time    09/14/12  8:05 AM      Result Value Range  aPTT 33  24 - 37 seconds  CBC     Status: Abnormal   Collection Time    09/14/12  8:05 AM      Result Value Range   WBC 9.0  4.0 - 10.5 K/uL   RBC 3.61 (*) 4.22 - 5.81 MIL/uL   Hemoglobin 11.3 (*) 13.0 - 17.0 g/dL   HCT 16.1 (*) 09.6 - 04.5 %   MCV 93.4  78.0 - 100.0 fL   MCH 31.3  26.0 - 34.0 pg   MCHC 33.5  30.0 - 36.0 g/dL   RDW 40.9 (*) 81.1 - 91.4 %   Platelets 228  150 - 400 K/uL  PROTIME-INR     Status: None   Collection Time    09/14/12  8:05 AM      Result Value Range   Prothrombin Time 13.9  11.6 - 15.2 seconds   INR 1.08  0.00 - 1.49   No  results found.  Review of Systems  Constitutional: Negative for fever and chills.  Respiratory: Negative for shortness of breath.        Occ cough via trach stoma  Cardiovascular: Negative for chest pain.  Gastrointestinal: Positive for nausea and vomiting. Negative for abdominal pain.  Musculoskeletal: Positive for back pain.  Neurological: Negative for headaches.  Endo/Heme/Allergies: Does not bruise/bleed easily.    Blood pressure 107/69, pulse 91, temperature 98 F (36.7 C), temperature source Oral, resp. rate 20, height 5\' 8"  (1.727 m), weight 122 lb (55.339 kg), SpO2 10.00%. Physical Exam  Constitutional: He is oriented to person, place, and time.  Thin BM in NAD  Neck:  Clean trach stoma  Cardiovascular: Normal rate and regular rhythm.   Respiratory: Effort normal and breath sounds normal.  GI: Soft. Bowel sounds are normal. He exhibits no distension. There is no tenderness.  Musculoskeletal: Normal range of motion. He exhibits no edema.  Neurological: He is alert and oriented to person, place, and time.     Assessment/Plan Pt with hx laryngeal carcinoma. Plan is for placement of a percutaneous gastrostomy tube today prior to planned chemoradiation. Details/risks of procedure d/w pt /family with their understanding and consent.  Tildon Silveria,D KEVIN 09/14/2012, 8:40 AM

## 2012-09-14 NOTE — Procedures (Signed)
Gastrostomy 20 Fr No comp

## 2012-09-15 ENCOUNTER — Encounter: Payer: Self-pay | Admitting: *Deleted

## 2012-09-15 ENCOUNTER — Telehealth: Payer: Self-pay | Admitting: Oncology

## 2012-09-15 ENCOUNTER — Other Ambulatory Visit: Payer: Medicare Other

## 2012-09-15 NOTE — Telephone Encounter (Signed)
dtr called re missing a phone call from our office. Per Rose she was trying to call pt re next appt. gv dtr next appt for 4/14 and pt will get schedule when he comes in.

## 2012-09-15 NOTE — Telephone Encounter (Signed)
Called pt and left telephone number to call us back , cant leave any messages

## 2012-09-19 NOTE — Patient Instructions (Addendum)
1. Diagnosis: Head and neck cancer. 2. Treatment: Weekly cisplatin chemotherapy and daily radiation. 3. Status: due to start chemo today.  4. Mouth sore:  To prevent early mouth sore from treatment, please perform salt/baking soda (1 teaspoon each in 1 Liter of water) mouth rinses about 3 times a day.  Continue liquid pain medication Hydrocodone/Acetaminopehn as needed. 5. To prevent esophageal stricture: Please performs swallowing exercise as much as possible during the day at least 3 times a day.

## 2012-09-20 ENCOUNTER — Ambulatory Visit: Payer: Self-pay | Admitting: Oncology

## 2012-09-20 ENCOUNTER — Ambulatory Visit
Admission: RE | Admit: 2012-09-20 | Discharge: 2012-09-20 | Disposition: A | Discharge status: Home / Self Care | Payer: Medicare Other | Source: Ambulatory Visit | Attending: Radiation Oncology | Admitting: Radiation Oncology

## 2012-09-20 ENCOUNTER — Other Ambulatory Visit: Payer: Self-pay | Admitting: Lab

## 2012-09-20 ENCOUNTER — Ambulatory Visit (HOSPITAL_BASED_OUTPATIENT_CLINIC_OR_DEPARTMENT_OTHER)
Admission: RE | Admit: 2012-09-20 | Discharge: 2012-09-20 | Disposition: A | Discharge status: Home / Self Care | Payer: Medicare Other | Source: Ambulatory Visit | Attending: Radiation Oncology | Admitting: Radiation Oncology

## 2012-09-20 ENCOUNTER — Ambulatory Visit: Payer: Medicare Other | Admitting: Nutrition

## 2012-09-20 ENCOUNTER — Ambulatory Visit (HOSPITAL_BASED_OUTPATIENT_CLINIC_OR_DEPARTMENT_OTHER): Payer: Medicare Other | Admitting: Oncology

## 2012-09-20 ENCOUNTER — Other Ambulatory Visit (HOSPITAL_BASED_OUTPATIENT_CLINIC_OR_DEPARTMENT_OTHER): Payer: Medicare Other | Admitting: Lab

## 2012-09-20 ENCOUNTER — Ambulatory Visit (HOSPITAL_BASED_OUTPATIENT_CLINIC_OR_DEPARTMENT_OTHER): Payer: Medicare Other

## 2012-09-20 VITALS — BP 109/68 | HR 105 | Temp 98.3°F | Resp 20 | Ht 68.0 in | Wt 123.4 lb

## 2012-09-20 DIAGNOSIS — C321 Malignant neoplasm of supraglottis: Secondary | ICD-10-CM

## 2012-09-20 DIAGNOSIS — Z5111 Encounter for antineoplastic chemotherapy: Secondary | ICD-10-CM

## 2012-09-20 LAB — CBC WITH DIFFERENTIAL/PLATELET
Basophils Absolute: 0 10*3/uL (ref 0.0–0.1)
EOS%: 0.2 % (ref 0.0–7.0)
Eosinophils Absolute: 0 10*3/uL (ref 0.0–0.5)
HGB: 11.6 g/dL — ABNORMAL LOW (ref 13.0–17.1)
NEUT#: 9.2 10*3/uL — ABNORMAL HIGH (ref 1.5–6.5)
RBC: 3.79 10*6/uL — ABNORMAL LOW (ref 4.20–5.82)
RDW: 15.3 % — ABNORMAL HIGH (ref 11.0–14.6)
lymph#: 1.9 10*3/uL (ref 0.9–3.3)
nRBC: 0 % (ref 0–0)

## 2012-09-20 LAB — COMPREHENSIVE METABOLIC PANEL (CC13)
ALT: 8 U/L (ref 0–55)
Albumin: 3.2 g/dL — ABNORMAL LOW (ref 3.5–5.0)
Alkaline Phosphatase: 82 U/L (ref 40–150)
CO2: 26 mEq/L (ref 22–29)
Glucose: 100 mg/dl — ABNORMAL HIGH (ref 70–99)
Potassium: 4.3 mEq/L (ref 3.5–5.1)
Sodium: 132 mEq/L — ABNORMAL LOW (ref 136–145)
Total Bilirubin: 0.92 mg/dL (ref 0.20–1.20)
Total Protein: 9.5 g/dL — ABNORMAL HIGH (ref 6.4–8.3)

## 2012-09-20 MED ORDER — POTASSIUM CHLORIDE 2 MEQ/ML IV SOLN
Freq: Once | INTRAVENOUS | Status: AC
  Administered 2012-09-20: 11:00:00 via INTRAVENOUS
  Filled 2012-09-20: qty 10

## 2012-09-20 MED ORDER — SODIUM CHLORIDE 0.9 % IV SOLN: Freq: Once | INTRAVENOUS | Status: DC

## 2012-09-20 MED ORDER — SODIUM CHLORIDE 0.9 % IV SOLN
150.0000 mg | Freq: Once | INTRAVENOUS | Status: AC
  Administered 2012-09-20: 150 mg via INTRAVENOUS
  Filled 2012-09-20: qty 5

## 2012-09-20 MED ORDER — SODIUM CHLORIDE 0.9 % IV SOLN
40.0000 mg/m2 | Freq: Once | INTRAVENOUS | Status: AC
  Administered 2012-09-20: 65 mg via INTRAVENOUS
  Filled 2012-09-20: qty 65

## 2012-09-20 MED ORDER — SODIUM CHLORIDE 0.9 % IV SOLN
INTRAVENOUS | Status: DC
  Administered 2012-09-20: 11:00:00 via INTRAVENOUS

## 2012-09-20 MED ORDER — DEXAMETHASONE SODIUM PHOSPHATE 4 MG/ML IJ SOLN
12.0000 mg | Freq: Once | INTRAMUSCULAR | Status: AC
  Administered 2012-09-20: 12 mg via INTRAVENOUS

## 2012-09-20 MED ORDER — PALONOSETRON HCL INJECTION 0.25 MG/5ML
0.2500 mg | Freq: Once | INTRAVENOUS | Status: AC
  Administered 2012-09-20: 0.25 mg via INTRAVENOUS

## 2012-09-20 NOTE — Progress Notes (Signed)
I spoke with patient in the chemotherapy room. Patient reports he continues to eat well. His weight has actually increased to 123.4 pounds documented April 14 which is increased from 119.7 pounds January 31 and is stable over the past 3 weeks. Patient does have his feeding tube which was placed on April 2. He denies problems flushing his tube at this time. Patient denies trouble swallowing at this time he states he is eating well.  Nutrition diagnosis: Unintended weight loss has improved.  Intervention: Patient was encouraged to continue oral nutrition supplements 3 times a day between meals. I have educated patient that we will again initiate tube feeding once oral intake decreases. Patient is in agreement. Teach back method used. Provided contact information so patient has my phone number for questions or concerns.  Monitoring, evaluation, goals: Patient has tolerated increased oral intake and has had slight weight gain.  Next visit: Monday, April 21, during chemotherapy.

## 2012-09-20 NOTE — Progress Notes (Signed)
Total urine output pre-Cisplatin = 825cc Total urine output post- Cisplatin = 700cc

## 2012-09-20 NOTE — Progress Notes (Signed)
   Weekly Management Note:  Outpatient Current Dose:  2 Gy  Projected Dose: 60 Gy   Narrative:  The patient presents for routine under treatment assessment.  CBCT/MVCT images/Port film x-rays were reviewed.  The chart was checked. No complaints. Heading up for chemo cycle 1  Physical Findings:  vitals were not taken for this visit. Stoma is clean, dry  CBC    Component Value Date/Time   WBC 12.5* 09/20/2012 0952   WBC 9.0 09/14/2012 0805   RBC 3.79* 09/20/2012 0952   RBC 3.61* 09/14/2012 0805   HGB 11.6* 09/20/2012 0952   HGB 11.3* 09/14/2012 0805   HCT 34.6* 09/20/2012 0952   HCT 33.7* 09/14/2012 0805   PLT 221 09/20/2012 0952   PLT 228 09/14/2012 0805   MCV 91.3 09/20/2012 0952   MCV 93.4 09/14/2012 0805   MCH 30.6 09/20/2012 0952   MCH 31.3 09/14/2012 0805   MCHC 33.5 09/20/2012 0952   MCHC 33.5 09/14/2012 0805   RDW 15.3* 09/20/2012 0952   RDW 16.0* 09/14/2012 0805   LYMPHSABS 1.9 09/20/2012 0952   LYMPHSABS 1.3 07/03/2012 1937   MONOABS 1.4* 09/20/2012 0952   MONOABS 0.7 07/03/2012 1937   EOSABS 0.0 09/20/2012 0952   EOSABS 0.1 07/03/2012 1937   BASOSABS 0.0 09/20/2012 0952   BASOSABS 0.0 07/03/2012 1937    CMP     Component Value Date/Time   NA 132* 09/20/2012 0952   NA 136 08/19/2012 0625   K 4.3 09/20/2012 0952   K 4.3 08/19/2012 0625   CL 96* 09/20/2012 0952   CL 100 08/19/2012 0625   CO2 26 09/20/2012 0952   CO2 28 08/19/2012 0625   GLUCOSE 100* 09/20/2012 0952   GLUCOSE 109* 08/19/2012 0625   BUN 8.8 09/20/2012 0952   BUN 9 08/19/2012 0625   CREATININE 1.0 09/20/2012 0952   CREATININE 0.69 08/19/2012 0625   CALCIUM 9.6 09/20/2012 0952   CALCIUM 8.3* 08/20/2012 0542   PROT 9.5* 09/20/2012 0952   PROT 6.9 08/19/2012 0625   ALBUMIN 3.2* 09/20/2012 0952   ALBUMIN 2.4* 08/19/2012 0625   AST 18 09/20/2012 0952   AST 25 08/19/2012 0625   ALT 8 09/20/2012 0952   ALT 20 08/19/2012 0625   ALKPHOS 82 09/20/2012 0952   ALKPHOS 57 08/19/2012 0625   BILITOT 0.92 09/20/2012 0952   BILITOT 0.6 08/19/2012 0625   GFRNONAA >90 08/19/2012 0625   GFRAA >90 08/19/2012 2841      Impression:  The patient is tolerating radiotherapy.  Plan:  Continue radiotherapy as planned.  ________________________________   Lonie Peak, M.D.

## 2012-09-20 NOTE — Progress Notes (Signed)
[facility]  [phone] [phone]   OFFICE PROGRESS NOTE   Cc:  Gwynneth Aliment, MD  DIAGNOSIS:  Newly diagnosed stage IVA laryngeal cancer.   PAST THERAPY: total laryngectomy and right neck dissection on 08/13/2012.    CURRENT THERAPY:  Pending start of adjuvant chemoradiation.   INTERVAL HISTORY: Cody [name] 67 y.o. male returns for regular follow up with his daughter to start chemo and radiation. He spends this last Saturday outside his house and felt very tired yesterday Sunday. He still has good appetite and continue to gain weight. He still needs to get help with shower and getting dressed from his daughter. He denies any pain in the tracheostomy site or discharge or foul odor. He denies any pain or the need any pain med. The rest of the 14 point review system was negative.  Past Medical History  Diagnosis Date  . TB (pulmonary tuberculosis) 1998  . Learning disabilities   . Cough   . Difficulty swallowing   . Pain on swallowing   . Abnormal weight loss documented 07/07/12     20  lbs  . Referred otalgia     Right Ear  . 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 Arthritis   . 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 Colonoscopy  2006    neg.   . Direct laryngoscopy  07/09/11    Epiglottic mass  . Direct laryngoscopy  07/08/2012    Procedure: DIRECT LARYNGOSCOPY;  Surgeon: Serena Colonel, MD;  Location: [facility] OR;  Service: ENT;  Laterality: N/A;  . Esophagoscopy  07/08/2012    Procedure: ESOPHAGOSCOPY;  Surgeon: Serena Colonel, MD;  Location: [facility] OR;  Service: ENT;  Laterality: N/A;  . Radical neck dissection Bilateral 08/13/2012    Procedure: RADICAL NECK DISSECTION;  Surgeon: Serena Colonel, MD;  Location: [facility] OR;  Service: ENT;  Laterality: Bilateral;  . Tracheostomy tube placement N/A 08/13/2012    Procedure: TRACHEOSTOMY;   Surgeon: Serena Colonel, MD;  Location: [facility] OR;  Service: ENT;  Laterality: N/A;  . Pectoralis flap N/A 08/13/2012    Procedure: RIGHT PECTORALIS MYOCUTANEOUS FLAP to LARYNX;  Surgeon: Etter Sjogren, MD;  Location: [facility] OR;  Service: Plastics;  Laterality: N/A;  . Multiple extractions with alveoloplasty N/A 08/19/2012    Procedure: Extraction of tooth #'s 1,2,3,4,5,6,10,11,12,13,14,16,17,18,19,21,22,23, 24,25,26,27,28,29,30,31,32 with alveoloplasty and bilateral mandibular buccal exostoses reductions;  Surgeon: Charlynne Pander, DDS;  Location: [facility] OR;  Service: Oral Surgery;  Laterality: N/A;  . Laryngoscopy N/A 08/19/2012    Procedure: LARYNGOSCOPY;  Surgeon: Serena Colonel, MD;  Location: [facility] OR;  Service: ENT;  Laterality: N/A;  Anesthesia's Laryngoscope    Current Outpatient Prescriptions  Medication Sig Dispense Refill  . LORazepam (ATIVAN) 0.5 MG tablet Take 0.5 mg by mouth every 6 (six) hours as needed (nausea or vomiting).      . ondansetron (ZOFRAN) 8 MG tablet Take 8 mg by mouth every 12 (twelve) hours as needed for nausea.      . prochlorperazine (COMPAZINE) 10 MG tablet Take 10 mg by mouth every 6 (six) hours as needed (nausea).       No current facility-administered medications for this visit.   Facility-Administered Medications Ordered in Other Visits  Medication Dose Route Frequency Provider Last Rate Last Dose  . 0.9 %  sodium chloride infusion  Intravenous Once Exie Parody, MD      . 0.9 %  sodium chloride infusion   Intravenous Continuous Exie Parody, MD 20 mL/hr at 09/20/12 1100    . CISplatin (PLATINOL) 65 mg in sodium chloride 0.9 % 250 mL chemo infusion  40 mg/m2 (Treatment Plan Actual) Intravenous Once Exie Parody, MD      . fosaprepitant (EMEND) 150 mg in sodium chloride 0.9 % 145 mL IVPB  150 mg Intravenous Once Exie Parody, MD 300 mL/hr at 09/20/12 1302 150 mg at 09/20/12 1302  . palonosetron (ALOXI) injection 0.25 mg  0.25 mg Intravenous Once Exie Parody, MD        ALLERGIES:  has No  Known Allergies.  REVIEW OF SYSTEMS:  The rest of the 14-point review of system was negative.   Filed Vitals:   09/20/12 1005  BP: 109/68  Pulse: 105  Temp: 98.3 F (36.8 C)  Resp: 20   Wt Readings from Last 3 Encounters:  09/20/12 123 lb 6.4 oz (55.974 kg)  09/14/12 122 lb (55.339 kg)  09/08/12 122 lb 1.6 oz (55.384 kg)   ECOG Performance status: 1-2  PHYSICAL EXAMINATION:   General:  Thin-appearing man,  in no acute distress.  Eyes:  no scleral icterus.  ENT:  There were no oropharyngeal lesions.  Neck was without thyromegaly.  Right neck dissection scar and trach stoma were well healed without erythema, purulent discharge, or foul odor.  Lymphatics:  Negative cervical, supraclavicular or axillary adenopathy.  Respiratory: lungs were clear bilaterally without wheezing or crackles.  Cardiovascular:  Regular rate and rhythm, S1/S2, without murmur, rub or gallop.  There was no pedal edema.  GI:  abdomen was soft, flat, nontender, nondistended, without organomegaly.  PEG tube was in place without any erythema, discharge, or pain on palpation. Muscoloskeletal:  no spinal tenderness of palpation of vertebral spine.  Skin exam was without echymosis, petichae.  Neuro exam was nonfocal.  Patient was able to get on and off exam table without assistance.  Gait was slow and measured.  Patient was alerted and oriented.  Attention was good.   Language was appropriate.  Mood was normal without depression.  Speech was not pressured.  Thought content was not tangential.         LABORATORY/RADIOLOGY DATA:  Lab Results  Component Value Date   WBC 12.5* 09/20/2012   HGB 11.6* 09/20/2012   HCT 34.6* 09/20/2012   PLT 221 09/20/2012   GLUCOSE 100* 09/20/2012   ALKPHOS 82 09/20/2012   ALT 8 09/20/2012   AST 18 09/20/2012   NA 132* 09/20/2012   K 4.3 09/20/2012   CL 96* 09/20/2012   CREATININE 1.0 09/20/2012   BUN 8.8 09/20/2012   CO2 26 09/20/2012   INR 1.08 09/14/2012     ASSESSMENT AND PLAN:    1. Diagnosis: Head and neck cancer. 2. Treatment: Weekly cisplatin chemotherapy and daily radiation to decrease the risk of recurrence head neck cancer. 3. Status: due to start chemo and radiation today.  I again went over with the patient and his daughter the potential side effects of cisplatin which include but not limited to fatigue, cytopenia, infection, mucositis, ototoxicity, nephrotoxicity, nausea vomiting. They expressed informed understanding wished to pursue chemotherapy today.  4. Mouth sore:  To prevent early mouth sore from treatment, please perform salt/baking soda (1 teaspoon each in 1 Liter of water) mouth rinses about 3 times a day.  Continue liquid pain medication Hydrocodone/Acetaminopehn as  needed. 5. To prevent esophageal stricture: Please performs swallowing exercise as much as possible during the day at least 3 times a day. 6. Followup: In 1 week with CBC and CMET.        The length of time of the face-to-face encounter was 15 minutes. More than 50% of time was spent counseling and coordination of care.

## 2012-09-20 NOTE — Progress Notes (Signed)
Pt voided 150 cc urine prior to going to chemo appt while in exam side.

## 2012-09-20 NOTE — Patient Instructions (Addendum)
[facility] Discharge Instructions for Patients Receiving Chemotherapy  Today you received the following chemotherapy agents Cisplatin  To help prevent nausea and vomiting after your treatment, we encourage you to take your nausea medication Ondansetron Begin taking it at 8:00pm and take it as often as prescribed for the next 36 hours.   If you develop nausea and vomiting that is not controlled by your nausea medication, call the clinic. If it is after clinic hours your family physician or the after hours number for the clinic or go to the Emergency Department.   BELOW ARE SYMPTOMS THAT SHOULD BE REPORTED IMMEDIATELY:  *FEVER GREATER THAN 100.5 F  *CHILLS WITH OR WITHOUT FEVER  NAUSEA AND VOMITING THAT IS NOT CONTROLLED WITH YOUR NAUSEA MEDICATION  *UNUSUAL SHORTNESS OF BREATH  *UNUSUAL BRUISING OR BLEEDING  TENDERNESS IN MOUTH AND THROAT WITH OR WITHOUT PRESENCE OF ULCERS  *URINARY PROBLEMS  *BOWEL PROBLEMS  UNUSUAL RASH Items with * indicate a potential emergency and should be followed up as soon as possible.  One of the nurses will contact you 24 hours after your treatment. Please let the nurse know about any problems that you may have experienced. Feel free to call the clinic you have any questions or concerns. The clinic phone number is [phone].   I have been informed and understand all the instructions given to me. I know to contact the clinic, my physician, or go to the Emergency Department if any problems should occur. I do not have any questions at this time, but understand that I may call the clinic during office hours   should I have any questions or need assistance in obtaining follow up care.     Cisplatin injection What is this medicine? CISPLATIN (SIS pla tin) is a chemotherapy drug. It targets fast dividing cells, like cancer cells, and causes these cells to die. This medicine is used to treat many types of cancer like bladder, ovarian,  and testicular cancers. This medicine may be used for other purposes; ask your health care provider or pharmacist if you have questions. What should I tell my health care provider before I take this medicine? They need to know if you have any of these conditions: -blood disorders -hearing problems -kidney disease -recent or ongoing radiation therapy -an unusual or allergic reaction to cisplatin, carboplatin, other chemotherapy, other medicines, foods, dyes, or preservatives -pregnant or trying to get pregnant -breast-feeding How should I use this medicine? This drug is given as an infusion into a vein. It is administered in a hospital or clinic by a specially trained health care professional. Talk to your pediatrician regarding the use of this medicine in children. Special care may be needed. Overdosage: If you think you have taken too much of this medicine contact a poison control center or emergency room at once. NOTE: This medicine is only for you. Do not share this medicine with others. What if I miss a dose? It is important not to miss a dose. Call your doctor or health care professional if you are unable to keep an appointment. What may interact with this medicine? -dofetilide -foscarnet -medicines for seizures -medicines to increase blood counts like filgrastim, pegfilgrastim, sargramostim -probenecid -pyridoxine used with altretamine -rituximab -some antibiotics like amikacin, gentamicin, neomycin, polymyxin B, streptomycin, tobramycin -sulfinpyrazone -vaccines -zalcitabine Talk to your doctor or health care professional before taking any of these medicines: -acetaminophen -aspirin -ibuprofen -ketoprofen -naproxen This list may not describe all possible interactions. Give your health care  provider a list of all the medicines, herbs, non-prescription drugs, or dietary supplements you use. Also tell them if you smoke, drink alcohol, or use illegal drugs. Some items may  interact with your medicine. What should I watch for while using this medicine? Your condition will be monitored carefully while you are receiving this medicine. You will need important blood work done while you are taking this medicine. This drug may make you feel generally unwell. This is not uncommon, as chemotherapy can affect healthy cells as well as cancer cells. Report any side effects. Continue your course of treatment even though you feel ill unless your doctor tells you to stop. In some cases, you may be given additional medicines to help with side effects. Follow all directions for their use. Call your doctor or health care professional for advice if you get a fever, chills or sore throat, or other symptoms of a cold or flu. Do not treat yourself. This drug decreases your body's ability to fight infections. Try to avoid being around people who are sick. This medicine may increase your risk to bruise or bleed. Call your doctor or health care professional if you notice any unusual bleeding. Be careful brushing and flossing your teeth or using a toothpick because you may get an infection or bleed more easily. If you have any dental work done, tell your dentist you are receiving this medicine. Avoid taking products that contain aspirin, acetaminophen, ibuprofen, naproxen, or ketoprofen unless instructed by your doctor. These medicines may hide a fever. Do not become pregnant while taking this medicine. Women should inform their doctor if they wish to become pregnant or think they might be pregnant. There is a potential for serious side effects to an unborn child. Talk to your health care professional or pharmacist for more information. Do not breast-feed an infant while taking this medicine. Drink fluids as directed while you are taking this medicine. This will help protect your kidneys. Call your doctor or health care professional if you get diarrhea. Do not treat yourself. What side effects may  I notice from receiving this medicine? Side effects that you should report to your doctor or health care professional as soon as possible: -allergic reactions like skin rash, itching or hives, swelling of the face, lips, or tongue -signs of infection - fever or chills, cough, sore throat, pain or difficulty passing urine -signs of decreased platelets or bleeding - bruising, pinpoint red spots on the skin, black, tarry stools, nosebleeds -signs of decreased red blood cells - unusually weak or tired, fainting spells, lightheadedness -breathing problems -changes in hearing -gout pain -low blood counts - This drug may decrease the number of white blood cells, red blood cells and platelets. You may be at increased risk for infections and bleeding. -nausea and vomiting -pain, swelling, redness or irritation at the injection site -pain, tingling, numbness in the hands or feet -problems with balance, movement -trouble passing urine or change in the amount of urine Side effects that usually do not require medical attention (report to your doctor or health care professional if they continue or are bothersome): -changes in vision -loss of appetite -metallic taste in the mouth or changes in taste This list may not describe all possible side effects. Call your doctor for medical advice about side effects. You may report side effects to FDA at 1-800-FDA-1088. Where should I keep my medicine? This drug is given in a hospital or clinic and will not be stored at home. NOTE: This sheet  is a summary. It may not cover all possible information. If you have questions about this medicine, talk to your doctor, pharmacist, or health care provider.  2012, [employer]. (08/31/2007 2:40:54 PM)  Ondansetron injection (begin taking on 09/22/12) What is this medicine? ONDANSETRON (on DAN se tron) is used to treat nausea and vomiting caused by chemotherapy. It is also used to prevent or treat nausea and  vomiting after surgery. This medicine may be used for other purposes; ask your health care provider or pharmacist if you have questions. What should I tell my health care provider before I take this medicine? They need to know if you have any of these conditions: -heart disease -history of irregular heartbeat -liver disease -low levels of magnesium or potassium in the blood -an unusual or allergic reaction to ondansetron, granisetron, other medicines, foods, dyes, or preservatives -pregnant or trying to get pregnant -breast-feeding How should I use this medicine? This medicine is for infusion into a vein. It is given by a health care professional in a hospital or clinic setting. Talk to your pediatrician regarding the use of this medicine in children. Special care may be needed. Overdosage: If you think you have taken too much of this medicine contact a poison control center or emergency room at once. NOTE: This medicine is only for you. Do not share this medicine with others. What if I miss a dose? This does not apply. What may interact with this medicine? Do not take this medicine with any of the following medications: -apomorphine  This medicine may also interact with the following medications: -carbamazepine -phenytoin -rifampicin -tramadol This list may not describe all possible interactions. Give your health care provider a list of all the medicines, herbs, non-prescription drugs, or dietary supplements you use. Also tell them if you smoke, drink alcohol, or use illegal drugs. Some items may interact with your medicine. What should I watch for while using this medicine? Your condition will be monitored carefully while you are receiving this medicine. What side effects may I notice from receiving this medicine? Side effects that you should report to your doctor or health care professional as soon as possible: -allergic reactions like skin rash, itching or hives, swelling of the  face, lips, or tongue -breathing problems -dizziness -fast or irregular heartbeat -feeling faint or lightheaded, falls -fever and chills -swelling of the hands and feet -tightness in the chest Side effects that usually do not require medical attention (report to your doctor or health care professional if they continue or are bothersome): -constipation or diarrhea -headache This list may not describe all possible side effects. Call your doctor for medical advice about side effects. You may report side effects to FDA at 1-800-FDA-1088. Where should I keep my medicine? This drug is given in a hospital or clinic and will not be stored at home. NOTE: This sheet is a summary. It may not cover all possible information. If you have questions about this medicine, talk to your doctor, pharmacist, or health care provider.  2012, [employer]. (02/27/2010 11:37:58 AM)    Dexamethasone injection What is this medicine? DEXAMETHASONE (dex a METH a sone) is a corticosteroid. It is used to treat inflammation of the skin, joints, lungs, and other organs. Common conditions treated include asthma, allergies, and arthritis. It is also used for other conditions, like blood disorders and diseases of the adrenal glands. This medicine may be used for other purposes; ask your health care provider or pharmacist if you have questions. What  should I tell my health care provider before I take this medicine? They need to know if you have any of these conditions: -blood clotting problems -Cushing's syndrome -diabetes -glaucoma -heart problems or disease -high blood pressure -infection like herpes, measles, tuberculosis, or chickenpox -kidney disease -liver disease -mental problems -myasthenia gravis -osteoporosis -previous heart attack -seizures -stomach, ulcer or intestine disease including colitis and diverticulitis -thyroid problem -an unusual or allergic reaction to dexamethasone,  corticosteroids, other medicines, lactose, foods, dyes, or preservatives -pregnant or trying to get pregnant -breast-feeding How should I use this medicine? This medicine is for injection into a muscle, joint, lesion, soft tissue, or vein. It is given by a health care professional in a hospital or clinic setting. Talk to your pediatrician regarding the use of this medicine in children. Special care may be needed. Overdosage: If you think you have taken too much of this medicine contact a poison control center or emergency room at once. NOTE: This medicine is only for you. Do not share this medicine with others. What if I miss a dose? This may not apply. If you are having a series of injections over a prolonged period, try not to miss an appointment. Call your doctor or health care professional to reschedule if you are unable to keep an appointment. What may interact with this medicine? Do not take this medicine with any of the following medications: -mifepristone, RU-486 -vaccines This medicine may also interact with the following medications: -amphotericin B -antibiotics like clarithromycin, erythromycin, and troleandomycin -aspirin and aspirin-like drugs -barbiturates like phenobarbital -carbamazepine -cholestyramine -cholinesterase inhibitors like donepezil, galantamine, rivastigmine, and tacrine -cyclosporine -digoxin -diuretics -ephedrine -male hormones, like estrogens or progestins and birth control pills -indinavir -isoniazid -ketoconazole -medicines for diabetes -medicines that improve muscle tone or strength for conditions like myasthenia gravis -NSAIDs, medicines for pain and inflammation, like ibuprofen or naproxen -phenytoin -rifampin -thalidomide -warfarin This list may not describe all possible interactions. Give your health care provider a list of all the medicines, herbs, non-prescription drugs, or dietary supplements you use. Also tell them if you smoke, drink  alcohol, or use illegal drugs. Some items may interact with your medicine. What should I watch for while using this medicine? Your condition will be monitored carefully while you are receiving this medicine. If you are taking this medicine for a long time, carry an identification card with your name and address, the type and dose of your medicine, and your doctor's name and address. This medicine may increase your risk of getting an infection. Stay away from people who are sick. Tell your doctor or health care professional if you are around anyone with measles or chickenpox. Talk to your health care provider before you get any vaccines that you take this medicine. If you are going to have surgery, tell your doctor or health care professional that you have taken this medicine within the last twelve months. Ask your doctor or health care professional about your diet. You may need to lower the amount of salt you eat. The medicine can increase your blood sugar. If you are a diabetic check with your doctor if you need help adjusting the dose of your diabetic medicine. What side effects may I notice from receiving this medicine? Side effects that you should report to your doctor or health care professional as soon as possible: -allergic reactions like skin rash, itching or hives, swelling of the face, lips, or tongue -black or tarry stools -change in the amount of urine -changes in vision -  confusion, excitement, restlessness, a false sense of well-being -fever, sore throat, sneezing, cough, or other signs of infection, wounds that will not heal -hallucinations -increased thirst -mental depression, mood swings, mistaken feelings of self importance or of being mistreated -pain in hips, back, ribs, arms, shoulders, or legs -pain, redness, or irritation at the injection site -redness, blistering, peeling or loosening of the skin, including inside the mouth -rounding out of face -swelling of feet or  lower legs -unusual bleeding or bruising -unusual tired or weak -wounds that do not heal Side effects that usually do not require medical attention (report to your doctor or health care professional if they continue or are bothersome): -diarrhea or constipation -change in taste -headache -nausea, vomiting -skin problems, acne, thin and shiny skin -touble sleeping -unusual growth of hair on the face or body -weight gain This list may not describe all possible side effects. Call your doctor for medical advice about side effects. You may report side effects to FDA at 1-800-FDA-1088. Where should I keep my medicine? This drug is given in a hospital or clinic and will not be stored at home. NOTE: This sheet is a summary. It may not cover all possible information. If you have questions about this medicine, talk to your doctor, pharmacist, or health care provider.  2012, [employer]. (09/16/2007 2:04:12 PM)  Prochlorperazine tablets (Compazine) What is this medicine? PROCHLORPERAZINE (proe klor PER a zeen) helps to control severe nausea and vomiting. This medicine is also used to treat schizophrenia. It can also help patients who experience anxiety that is not due to psychological illness. This medicine may be used for other purposes; ask your health care provider or pharmacist if you have questions. What should I tell my health care provider before I take this medicine? They need to know if you have any of these conditions: -blood disorders or disease -dementia -liver disease or jaundice -Parkinson's disease -uncontrollable movement disorder -an unusual or allergic reaction to prochlorperazine, other medicines, foods, dyes, or preservatives -pregnant or trying to get pregnant -breast-feeding How should I use this medicine? Take this medicine by mouth with a glass of water. Follow the directions on the prescription label. Take your doses at regular intervals. Do not take your  medicine more often than directed. Do not stop taking this medicine suddenly. This can cause nausea, vomiting, and dizziness. Ask your doctor or health care professional for advice. Talk to your pediatrician regarding the use of this medicine in children. Special care may be needed. While this drug may be prescribed for children as young as 2 years for selected conditions, precautions do apply. Overdosage: If you think you have taken too much of this medicine contact a poison control center or emergency room at once. NOTE: This medicine is only for you. Do not share this medicine with others. What if I miss a dose? If you miss a dose, take it as soon as you can. If it is almost time for your next dose, take only that dose. Do not take double or extra doses. What may interact with this medicine? Do not take this medicine with any of the following medications: -amoxapine -antidepressants like citalopram, escitalopram, fluoxetine, paroxetine, and sertraline -deferoxamine -dofetilide -maprotiline -tricyclic antidepressants like amitriptyline, clomipramine, imipramine, nortiptyline and others This medicine may also interact with the following medications: -lithium -medicines for pain -phenytoin -propranolol -warfarin This list may not describe all possible interactions. Give your health care provider a list of all the medicines, herbs, non-prescription drugs, or dietary  supplements you use. Also tell them if you smoke, drink alcohol, or use illegal drugs. Some items may interact with your medicine. What should I watch for while using this medicine? Visit your doctor or health care professional for regular checks on your progress. You may get drowsy or dizzy. Do not drive, use machinery, or do anything that needs mental alertness until you know how this medicine affects you. Do not stand or sit up quickly, especially if you are an older patient. This reduces the risk of dizzy or fainting spells.  Alcohol may interfere with the effect of this medicine. Avoid alcoholic drinks. This medicine can reduce the response of your body to heat or cold. Try not to get overheated. Avoid temperature extremes, such as saunas, hot tubs, or very hot or cold baths or showers. Dress warmly in cold weather. This medicine can make you more sensitive to the sun. Keep out of the sun. If you cannot avoid being in the sun, wear protective clothing and use sunscreen. Do not use sun lamps or tanning beds/booths. Your mouth may get dry. Chewing sugarless gum or sucking hard candy, and drinking plenty of water may help. Contact your doctor if the problem does not go away or is severe. What side effects may I notice from receiving this medicine? Side effects that you should report to your doctor or health care professional as soon as possible: -blurred vision -breast enlargement in men or women -breast milk in women who are not breast-feeding -chest pain, fast or irregular heartbeat -confusion, restlessness -dark yellow or brown urine -difficulty breathing or swallowing -dizziness or fainting spells -drooling, shaking, movement difficulty (shuffling walk) or rigidity -fever, chills, sore throat -involuntary or uncontrollable movements of the eyes, mouth, head, arms, and legs -seizures -stomach area pain -unusually weak or tired -unusual bleeding or bruising -yellowing of skin or eyes Side effects that usually do not require medical attention (report to your doctor or health care professional if they continue or are bothersome): -difficulty passing urine -difficulty sleeping -headache -sexual dysfunction -skin rash, or itching This list may not describe all possible side effects. Call your doctor for medical advice about side effects. You may report side effects to FDA at 1-800-FDA-1088. Where should I keep my medicine? Keep out of the reach of children. Store at room temperature between 15 and 30 degrees C  (59 and 86 degrees F). Protect from light. Throw away any unused medicine after the expiration date. NOTE: This sheet is a summary. It may not cover all possible information. If you have questions about this medicine, talk to your doctor, pharmacist, or health care provider.  2012, [employer]. (12/27/2007 4:26:50 PM)

## 2012-09-20 NOTE — Progress Notes (Signed)
IMRT Device Note OUTPATIENT  9.5  delivered field widths represent one set of IMRT treatment devices. The code is [phone].  -----------------------------------  Lonie Peak, MD

## 2012-09-21 ENCOUNTER — Ambulatory Visit
Admission: RE | Admit: 2012-09-21 | Discharge: 2012-09-21 | Disposition: A | Discharge status: Home / Self Care | Payer: Medicare Other | Source: Ambulatory Visit | Attending: Radiation Oncology | Admitting: Radiation Oncology

## 2012-09-22 ENCOUNTER — Ambulatory Visit
Admission: RE | Admit: 2012-09-22 | Discharge: 2012-09-22 | Disposition: A | Discharge status: Home / Self Care | Payer: Medicare Other | Source: Ambulatory Visit | Attending: Radiation Oncology | Admitting: Radiation Oncology

## 2012-09-23 ENCOUNTER — Ambulatory Visit
Admission: RE | Admit: 2012-09-23 | Discharge: 2012-09-23 | Disposition: A | Discharge status: Home / Self Care | Payer: Medicare Other | Source: Ambulatory Visit | Attending: Radiation Oncology | Admitting: Radiation Oncology

## 2012-09-24 ENCOUNTER — Ambulatory Visit (HOSPITAL_BASED_OUTPATIENT_CLINIC_OR_DEPARTMENT_OTHER): Payer: Medicare Other | Admitting: Oncology

## 2012-09-24 ENCOUNTER — Other Ambulatory Visit (HOSPITAL_BASED_OUTPATIENT_CLINIC_OR_DEPARTMENT_OTHER): Payer: Medicare Other

## 2012-09-24 ENCOUNTER — Ambulatory Visit
Admission: RE | Admit: 2012-09-24 | Discharge: 2012-09-24 | Disposition: A | Discharge status: Home / Self Care | Payer: Medicare Other | Source: Ambulatory Visit | Attending: Radiation Oncology | Admitting: Radiation Oncology

## 2012-09-24 ENCOUNTER — Encounter: Payer: Self-pay | Admitting: Oncology

## 2012-09-24 VITALS — BP 111/73 | HR 82 | Temp 98.3°F | Resp 20 | Ht 68.0 in | Wt 121.7 lb

## 2012-09-24 DIAGNOSIS — C321 Malignant neoplasm of supraglottis: Secondary | ICD-10-CM

## 2012-09-24 LAB — BASIC METABOLIC PANEL (CC13)
Calcium: 8.8 mg/dL (ref 8.4–10.4)
Creatinine: 1.1 mg/dL (ref 0.7–1.3)
Sodium: 132 mEq/L — ABNORMAL LOW (ref 136–145)

## 2012-09-24 LAB — CBC WITH DIFFERENTIAL/PLATELET
BASO%: 0.4 % (ref 0.0–2.0)
EOS%: 2.6 % (ref 0.0–7.0)
Eosinophils Absolute: 0.1 10*3/uL (ref 0.0–0.5)
LYMPH%: 23.2 % (ref 14.0–49.0)
MCHC: 33.2 g/dL (ref 32.0–36.0)
MCV: 91.9 fL (ref 79.3–98.0)
MONO%: 10.2 % (ref 0.0–14.0)
NEUT#: 3.2 10*3/uL (ref 1.5–6.5)
Platelets: 247 10*3/uL (ref 140–400)
RBC: 3.6 10*6/uL — ABNORMAL LOW (ref 4.20–5.82)
RDW: 15.2 % — ABNORMAL HIGH (ref 11.0–14.6)
nRBC: 0 % (ref 0–0)

## 2012-09-24 MED ORDER — HYDROCODONE-ACETAMINOPHEN 7.5-325 MG/15ML PO SOLN: 15.0000 mL | Freq: Four times a day (QID) | ORAL | Status: DC | PRN

## 2012-09-24 NOTE — Progress Notes (Signed)
[facility]  Telephone:([phone] Fax:(336) [phone]   OFFICE PROGRESS NOTE   Cc:  Gwynneth Aliment, MD  DIAGNOSIS:  Newly diagnosed stage IVA laryngeal cancer.   PAST THERAPY: total laryngectomy and right neck dissection on 08/13/2012.    CURRENT THERAPY: Started adjuvant chemoradiation on 09/20/12. Receiving chemotherapy with weekly Cisplatin.   INTERVAL HISTORY: Cody [name] 67 y.o. male returns for regular follow up with his daughter. Tolerated first cycle of chemo well. He still has good appetite and continue to gain weight. He still needs to get help with shower and getting dressed from his daughter. He denies any pain in the tracheostomy site or discharge or foul odor. He denies any pain or the need any pain med. The rest of the 14 point review system was negative.  Past Medical History  Diagnosis Date  . TB (pulmonary tuberculosis) 1998  . Learning disabilities   . Cough   . Difficulty swallowing   . Pain on swallowing   . Abnormal weight loss documented 07/07/12     20 lbs  . Referred otalgia     Right Ear  . 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 Arthritis   . 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 Colonoscopy  2006    neg.   . Direct laryngoscopy  07/09/11    Epiglottic mass  . Direct laryngoscopy  07/08/2012    Procedure: DIRECT LARYNGOSCOPY;  Surgeon: Serena Colonel, MD;  Location: [facility] OR;  Service: ENT;  Laterality: N/A;  . Esophagoscopy  07/08/2012    Procedure: ESOPHAGOSCOPY;  Surgeon: Serena Colonel, MD;  Location: [facility] OR;  Service: ENT;  Laterality: N/A;  . Radical neck dissection Bilateral 08/13/2012    Procedure: RADICAL NECK DISSECTION;  Surgeon: Serena Colonel, MD;  Location: [facility] OR;  Service: ENT;  Laterality: Bilateral;  . Tracheostomy tube placement N/A 08/13/2012    Procedure: TRACHEOSTOMY;  Surgeon: Serena Colonel, MD;   Location: [facility] OR;  Service: ENT;  Laterality: N/A;  . Pectoralis flap N/A 08/13/2012    Procedure: RIGHT PECTORALIS MYOCUTANEOUS FLAP to LARYNX;  Surgeon: Etter Sjogren, MD;  Location: [facility] OR;  Service: Plastics;  Laterality: N/A;  . Multiple extractions with alveoloplasty N/A 08/19/2012    Procedure: Extraction of tooth #'s 1,2,3,4,5,6,10,11,12,13,14,16,17,18,19,21,22,23, 24,25,26,27,28,29,30,31,32 with alveoloplasty and bilateral mandibular buccal exostoses reductions;  Surgeon: Charlynne Pander, DDS;  Location: [facility] OR;  Service: Oral Surgery;  Laterality: N/A;  . Laryngoscopy N/A 08/19/2012    Procedure: LARYNGOSCOPY;  Surgeon: Serena Colonel, MD;  Location: [facility] OR;  Service: ENT;  Laterality: N/A;  Anesthesia's Laryngoscope    Current Outpatient Prescriptions  Medication Sig Dispense Refill  . HYDROcodone-acetaminophen (HYCET) 7.5-325 mg/15 ml solution Take 15 mLs by mouth 4 (four) times daily as needed for pain.  480 mL  0  . LORazepam (ATIVAN) 0.5 MG tablet Take 0.5 mg by mouth every 6 (six) hours as needed (nausea or vomiting).      . ondansetron (ZOFRAN) 8 MG tablet Take 8 mg by mouth every 12 (twelve) hours as needed for nausea.      . prochlorperazine (COMPAZINE) 10 MG tablet Take 10 mg by mouth every 6 (six) hours as needed (nausea).       No current facility-administered medications for this visit.    ALLERGIES:  has No Known Allergies.  REVIEW OF SYSTEMS:  The rest of the 14-point review of system was negative.   Filed Vitals:   09/24/12 1312  BP: 111/73  Pulse: 82  Temp: 98.3 F (36.8 C)  Resp: 20   Wt Readings from Last 3 Encounters:  09/24/12 121 lb 11.2 oz (55.203 kg)  09/20/12 123 lb 6.4 oz (55.974 kg)  09/14/12 122 lb (55.339 kg)   ECOG Performance status: 1-2  PHYSICAL EXAMINATION:   General:  Thin-appearing man,  in no acute distress.  Eyes:  no scleral icterus.  ENT:  There were no oropharyngeal lesions.  Neck was without thyromegaly.  Right neck dissection scar and  trach stoma were well healed without erythema, purulent discharge, or foul odor.  Lymphatics:  Negative cervical, supraclavicular or axillary adenopathy.  Respiratory: lungs were clear bilaterally without wheezing or crackles.  Cardiovascular:  Regular rate and rhythm, S1/S2, without murmur, rub or gallop.  There was no pedal edema.  GI:  abdomen was soft, flat, nontender, nondistended, without organomegaly.  PEG tube was in place without any erythema, discharge, or pain on palpation. Muscoloskeletal:  no spinal tenderness of palpation of vertebral spine.  Skin exam was without echymosis, petichae.  Neuro exam was nonfocal.  Patient was able to get on and off exam table without assistance.  Gait was slow and measured.  Patient was alerted and oriented.  Attention was good.   Language was appropriate.  Mood was normal without depression.  Speech was not pressured.  Thought content was not tangential.      LABORATORY/RADIOLOGY DATA:  Lab Results  Component Value Date   WBC 5.0 09/24/2012   HGB 11.0* 09/24/2012   HCT 33.1* 09/24/2012   PLT 247 09/24/2012   GLUCOSE 74 09/24/2012   ALKPHOS 82 09/20/2012   ALT 8 09/20/2012   AST 18 09/20/2012   NA 132* 09/24/2012   K 4.2 09/24/2012   CL 99 09/24/2012   CREATININE 1.1 09/24/2012   BUN 12.9 09/24/2012   CO2 22 09/24/2012   INR 1.08 09/14/2012     ASSESSMENT AND PLAN:   1. Diagnosis: Head and neck cancer. 2. Treatment: Weekly cisplatin chemotherapy and daily radiation to decrease the risk of recurrence head neck cancer. 3. Status: S/P 1 dose of weekly Cisplatin. Tolerating well. Recommend that he proceed with weekly Cisplatin without dose modification on 09/27/12. 4. Mouth sore:  To prevent early mouth sore from treatment, please perform salt/baking soda (1 teaspoon each in 1 Liter of water) mouth rinses about 3 times a day.  Continue liquid pain medication Hydrocodone/Acetaminophen as needed. 5. To prevent esophageal stricture: Please performs swallowing  exercise as much as possible during the day at least 3 times a day. 6. Followup: In 1 week with CBC and CMET.        The length of time of the face-to-face encounter was 15 minutes. More than 50% of time was spent counseling and coordination of care.

## 2012-09-24 NOTE — Patient Instructions (Addendum)
Mouth rinse: Alternate between the following:  - salt/baking soda (1 teaspoon of salt; 1 teaspoon of baking soda; in 1 Liter of water). Use 3-4 times per day to prevent mouth sores. - diluted hydrogen peroxide (1 part peroxide to 4 parts water). Use 3-4 times per day to thin the thick mucus in your mouth.

## 2012-09-27 ENCOUNTER — Encounter: Payer: Self-pay | Admitting: Nutrition

## 2012-09-27 ENCOUNTER — Ambulatory Visit: Payer: Self-pay | Admitting: Oncology

## 2012-09-27 ENCOUNTER — Other Ambulatory Visit: Payer: Self-pay | Admitting: Lab

## 2012-09-27 ENCOUNTER — Ambulatory Visit (HOSPITAL_BASED_OUTPATIENT_CLINIC_OR_DEPARTMENT_OTHER): Payer: Medicare Other

## 2012-09-27 ENCOUNTER — Encounter: Payer: Self-pay | Admitting: Oncology

## 2012-09-27 ENCOUNTER — Ambulatory Visit
Admission: RE | Admit: 2012-09-27 | Discharge: 2012-09-27 | Disposition: A | Discharge status: Home / Self Care | Payer: Medicare Other | Source: Ambulatory Visit | Attending: Radiation Oncology | Admitting: Radiation Oncology

## 2012-09-27 ENCOUNTER — Ambulatory Visit: Payer: Medicare Other | Admitting: Nutrition

## 2012-09-27 VITALS — BP 109/75 | HR 105 | Temp 98.6°F | Resp 16

## 2012-09-27 VITALS — BP 105/66 | HR 83 | Temp 97.4°F | Ht 68.0 in | Wt 120.1 lb

## 2012-09-27 DIAGNOSIS — Z5111 Encounter for antineoplastic chemotherapy: Secondary | ICD-10-CM

## 2012-09-27 DIAGNOSIS — C321 Malignant neoplasm of supraglottis: Secondary | ICD-10-CM

## 2012-09-27 MED ORDER — SODIUM CHLORIDE 0.9 % IV SOLN
150.0000 mg | Freq: Once | INTRAVENOUS | Status: AC
  Administered 2012-09-27: 150 mg via INTRAVENOUS
  Filled 2012-09-27: qty 5

## 2012-09-27 MED ORDER — SODIUM CHLORIDE 0.9 % IV SOLN
40.0000 mg/m2 | Freq: Once | INTRAVENOUS | Status: AC
  Administered 2012-09-27: 65 mg via INTRAVENOUS
  Filled 2012-09-27: qty 65

## 2012-09-27 MED ORDER — RADIAPLEXRX EX GEL
Freq: Once | CUTANEOUS | Status: AC
  Administered 2012-09-27: 1 via TOPICAL

## 2012-09-27 MED ORDER — DEXAMETHASONE SODIUM PHOSPHATE 4 MG/ML IJ SOLN
12.0000 mg | Freq: Once | INTRAMUSCULAR | Status: AC
  Administered 2012-09-27: 12 mg via INTRAVENOUS

## 2012-09-27 MED ORDER — SUCRALFATE 1 G PO TABS: ORAL_TABLET | ORAL | Status: DC

## 2012-09-27 MED ORDER — PALONOSETRON HCL INJECTION 0.25 MG/5ML
0.2500 mg | Freq: Once | INTRAVENOUS | Status: AC
  Administered 2012-09-27: 0.25 mg via INTRAVENOUS

## 2012-09-27 MED ORDER — SODIUM CHLORIDE 0.9 % IV SOLN
Freq: Once | INTRAVENOUS | Status: AC
  Administered 2012-09-27: 12:00:00 via INTRAVENOUS

## 2012-09-27 MED ORDER — POTASSIUM CHLORIDE 2 MEQ/ML IV SOLN
Freq: Once | INTRAVENOUS | Status: AC
  Administered 2012-09-27: 13:00:00 via INTRAVENOUS
  Filled 2012-09-27: qty 10

## 2012-09-27 MED ORDER — MAGIC MOUTHWASH W/LIDOCAINE: ORAL | Status: DC

## 2012-09-27 NOTE — Progress Notes (Signed)
100% ind 09/27/12-03/29/13. I will send letter and card to the patient. Daughter has letter of support also for dad.

## 2012-09-27 NOTE — Patient Instructions (Addendum)
[facility] Discharge Instructions for Patients Receiving Chemotherapy  Today you received the following chemotherapy agents cisplatin  To help prevent nausea and vomiting after your treatment, we encourage you to take your nausea medication as directed by MD Begin taking it at 6 pm and take it as often as prescribed if needed.   If you develop nausea and vomiting that is not controlled by your nausea medication, call the clinic. If it is after clinic hours your family physician or the after hours number for the clinic or go to the Emergency Department.   BELOW ARE SYMPTOMS THAT SHOULD BE REPORTED IMMEDIATELY:  *FEVER GREATER THAN 100.5 F  *CHILLS WITH OR WITHOUT FEVER  NAUSEA AND VOMITING THAT IS NOT CONTROLLED WITH YOUR NAUSEA MEDICATION  *UNUSUAL SHORTNESS OF BREATH  *UNUSUAL BRUISING OR BLEEDING  TENDERNESS IN MOUTH AND THROAT WITH OR WITHOUT PRESENCE OF ULCERS  *URINARY PROBLEMS  *BOWEL PROBLEMS  UNUSUAL RASH Items with * indicate a potential emergency and should be followed up as soon as possible.  Feel free to call the clinic you have any questions or concerns. The clinic phone number is [phone].   I have been informed and understand all the instructions given to me. I know to contact the clinic, my physician, or go to the Emergency Department if any problems should occur. I do not have any questions at this time, but understand that I may call the clinic during office hours   should I have any questions or need assistance in obtaining follow up care.    __________________________________________  _____________  __________ Signature of Patient or Authorized Representative            Date                   Time    __________________________________________ Nurse's Signature

## 2012-09-27 NOTE — Progress Notes (Addendum)
Mr. Whittlesey here with his daughter for weekly PUT visit.  He has had 6/30 fractions to his bilateral neck.  He denies pain and fatigue.  He is eating solids and liquids that are blended.  He is stating that his hands are occasionally numb and tingling.  His right neck does have a bulging area that is warm to the tough.  He was given the Radiation Therapy and You booklet and instructed on the potential side effects of radiation therapy including fatigue, pain, throat and mouth changes, nausea and diet.  He was given Radiaplex gel and instructed to apply it twice a day with the first application at least 4 hours prior to his treatment.

## 2012-09-27 NOTE — Progress Notes (Signed)
200 cc urine before cisplatin started. 200 cc urine after cisplatin started

## 2012-09-27 NOTE — Progress Notes (Signed)
   Weekly Management Note:  outpatient Current Dose:  12 Gy  Projected Dose: 60 Gy   Narrative:  The patient presents for routine under treatment assessment.  CBCT/MVCT images/Port film x-rays were reviewed.  The chart was checked. receiving weekly cisplatin. Taste changing. No new pain.  Physical Findings:  height is 5\' 8"  (1.727 m) and weight is 120 lb 1.6 oz (54.477 kg). His temperature is 97.4 F (36.3 C). His blood pressure is 105/66 and his pulse is 83.  flap reconstruction at right SCV region is very puffy but stable per caregiver.   No excessive warmth, no erythema. Stoma clean.  CBC    Component Value Date/Time   WBC 5.0 09/24/2012 1253   WBC 9.0 09/14/2012 0805   RBC 3.60* 09/24/2012 1253   RBC 3.61* 09/14/2012 0805   HGB 11.0* 09/24/2012 1253   HGB 11.3* 09/14/2012 0805   HCT 33.1* 09/24/2012 1253   HCT 33.7* 09/14/2012 0805   PLT 247 09/24/2012 1253   PLT 228 09/14/2012 0805   MCV 91.9 09/24/2012 1253   MCV 93.4 09/14/2012 0805   MCH 30.6 09/24/2012 1253   MCH 31.3 09/14/2012 0805   MCHC 33.2 09/24/2012 1253   MCHC 33.5 09/14/2012 0805   RDW 15.2* 09/24/2012 1253   RDW 16.0* 09/14/2012 0805   LYMPHSABS 1.2 09/24/2012 1253   LYMPHSABS 1.3 07/03/2012 1937   MONOABS 0.5 09/24/2012 1253   MONOABS 0.7 07/03/2012 1937   EOSABS 0.1 09/24/2012 1253   EOSABS 0.1 07/03/2012 1937   BASOSABS 0.0 09/24/2012 1253   BASOSABS 0.0 07/03/2012 1937   CMP     Component Value Date/Time   NA 132* 09/24/2012 1253   NA 136 08/19/2012 0625   K 4.2 09/24/2012 1253   K 4.3 08/19/2012 0625   CL 99 09/24/2012 1253   CL 100 08/19/2012 0625   CO2 22 09/24/2012 1253   CO2 28 08/19/2012 0625   GLUCOSE 74 09/24/2012 1253   GLUCOSE 109* 08/19/2012 0625   BUN 12.9 09/24/2012 1253   BUN 9 08/19/2012 0625   CREATININE 1.1 09/24/2012 1253   CREATININE 0.69 08/19/2012 0625   CALCIUM 8.8 09/24/2012 1253   CALCIUM 8.3* 08/20/2012 0542   PROT 9.5* 09/20/2012 0952   PROT 6.9 08/19/2012 0625   ALBUMIN 3.2* 09/20/2012 0952   ALBUMIN 2.4*  08/19/2012 0625   AST 18 09/20/2012 0952   AST 25 08/19/2012 0625   ALT 8 09/20/2012 0952   ALT 20 08/19/2012 0625   ALKPHOS 82 09/20/2012 0952   ALKPHOS 57 08/19/2012 0625   BILITOT 0.92 09/20/2012 0952   BILITOT 0.6 08/19/2012 0625   GFRNONAA >90 08/19/2012 0625   GFRAA >90 08/19/2012 5284      Impression:  The patient is tolerating radiotherapy.  Plan:  Continue radiotherapy as planned. Rx /instructions have been Given-  Baking Soda Rinse - 1 tsp salt, 1 tsp baking soda, 1 qt water - swish gargle and spit as needed to soothe/cleanse mouth. Use as often as you want.  Sucralfate - crush 1 tablet in 10 mL H20 and swallow up to four times a day to soothe throat.  Magic mouthwash with Lidocaine - Swish/swallow 10mL, 30 minutes before meals and bedtime, to numb up sore mouth and/or throat. Alternate with Sucralfate.  Hycet - for pain in spite of the meds above.  Use in addition to the meds above.   ________________________________   Lonie Peak, M.D.

## 2012-09-27 NOTE — Progress Notes (Signed)
Patient's weight has decreased to 120.1 pounds. This is a decrease from 123.4 pounds documented April 14. Patient reports he continues to eat and refuses to begin tube feedings. He is tolerating high-protein high-calorie smoothies. Patient agrees to increase Ensure Plus to 3-4 daily.  Nutrition diagnosis: Unintended weight loss continues.  Intervention: I educated patient to increase oral nutrition supplements to 4 times a day. He was educated to continue to increase oral intake to minimize further weight loss. I have educated and encouraged patient that we will need to use his tube for nutrition support if he has continued weight loss.  Patient verbalizes understanding. I provided patient with one complementary case of Ensure Plus.  Monitoring, evaluation, goals: Patient is tolerating oral diet however has had continued weight loss. He will work to increase oral intake with oral nutrition supplements 4 times a day.  Next visit: Monday, April 28, during treatment.

## 2012-09-27 NOTE — Patient Instructions (Signed)
Baking Soda Rinse - 1 tsp salt, 1 tsp baking soda, 1 qt water - swish gargle and spit as needed to soothe/cleanse mouth. Use as often as you want.  Sucralfate - crush 1 tablet in 10 mL H20 and swallow up to four times a day to soothe throat.  Magic mouthwash with Lidocaine - Swish/swallow 10mL, 30 minutes before meals and bedtime, to numb up sore mouth and/or throat. Alternate with Sucralfate.  Hycet - for pain in spite of the meds above.  Use in addition to the meds above.

## 2012-09-28 ENCOUNTER — Encounter: Payer: Self-pay | Admitting: Oncology

## 2012-09-28 ENCOUNTER — Ambulatory Visit
Admission: RE | Admit: 2012-09-28 | Discharge: 2012-09-28 | Disposition: A | Discharge status: Home / Self Care | Payer: Medicare Other | Source: Ambulatory Visit | Attending: Radiation Oncology | Admitting: Radiation Oncology

## 2012-09-28 NOTE — Progress Notes (Signed)
400.00 [facility] for patient. He signed and I advised him and daugthter one time and I have copy of 400.00, letter and card. He was in the office on today.

## 2012-09-29 ENCOUNTER — Ambulatory Visit: Payer: Medicare Other

## 2012-09-29 ENCOUNTER — Ambulatory Visit
Admission: RE | Admit: 2012-09-29 | Discharge: 2012-09-29 | Disposition: A | Discharge status: Home / Self Care | Payer: Medicare Other | Source: Ambulatory Visit | Attending: Radiation Oncology | Admitting: Radiation Oncology

## 2012-09-30 ENCOUNTER — Ambulatory Visit
Admission: RE | Admit: 2012-09-30 | Discharge: 2012-09-30 | Disposition: A | Discharge status: Home / Self Care | Payer: Medicare Other | Source: Ambulatory Visit | Attending: Radiation Oncology | Admitting: Radiation Oncology

## 2012-10-01 ENCOUNTER — Ambulatory Visit
Admission: RE | Admit: 2012-10-01 | Discharge: 2012-10-01 | Disposition: A | Discharge status: Home / Self Care | Payer: Medicare Other | Source: Ambulatory Visit | Attending: Radiation Oncology | Admitting: Radiation Oncology

## 2012-10-04 ENCOUNTER — Ambulatory Visit: Payer: Medicare Other | Admitting: Nutrition

## 2012-10-04 ENCOUNTER — Encounter: Payer: Self-pay | Admitting: Oncology

## 2012-10-04 ENCOUNTER — Ambulatory Visit
Admission: RE | Admit: 2012-10-04 | Discharge: 2012-10-04 | Disposition: A | Discharge status: Home / Self Care | Payer: Medicare Other | Source: Ambulatory Visit | Attending: Radiation Oncology | Admitting: Radiation Oncology

## 2012-10-04 ENCOUNTER — Other Ambulatory Visit (HOSPITAL_BASED_OUTPATIENT_CLINIC_OR_DEPARTMENT_OTHER): Payer: Medicare Other | Admitting: Lab

## 2012-10-04 ENCOUNTER — Ambulatory Visit: Payer: Medicare Other

## 2012-10-04 ENCOUNTER — Ambulatory Visit (HOSPITAL_BASED_OUTPATIENT_CLINIC_OR_DEPARTMENT_OTHER): Payer: Medicare Other | Admitting: Oncology

## 2012-10-04 ENCOUNTER — Encounter: Payer: Self-pay | Admitting: Radiation Oncology

## 2012-10-04 ENCOUNTER — Ambulatory Visit (HOSPITAL_BASED_OUTPATIENT_CLINIC_OR_DEPARTMENT_OTHER): Payer: Medicare Other

## 2012-10-04 VITALS — BP 110/73 | HR 67 | Temp 97.2°F | Resp 16 | Ht 68.0 in | Wt 122.6 lb

## 2012-10-04 VITALS — BP 124/71 | HR 72 | Temp 97.8°F | Wt 122.0 lb

## 2012-10-04 DIAGNOSIS — C321 Malignant neoplasm of supraglottis: Secondary | ICD-10-CM

## 2012-10-04 DIAGNOSIS — Z5111 Encounter for antineoplastic chemotherapy: Secondary | ICD-10-CM

## 2012-10-04 LAB — BASIC METABOLIC PANEL (CC13)
BUN: 14.5 mg/dL (ref 7.0–26.0)
CO2: 27 mEq/L (ref 22–29)
Chloride: 97 mEq/L — ABNORMAL LOW (ref 98–107)
Creatinine: 1.2 mg/dL (ref 0.7–1.3)

## 2012-10-04 LAB — CBC WITH DIFFERENTIAL/PLATELET
Basophils Absolute: 0 10*3/uL (ref 0.0–0.1)
HCT: 32.5 % — ABNORMAL LOW (ref 38.4–49.9)
HGB: 10.8 g/dL — ABNORMAL LOW (ref 13.0–17.1)
MCH: 30.4 pg (ref 27.2–33.4)
MONO#: 0.5 10*3/uL (ref 0.1–0.9)
NEUT%: 76.2 % — ABNORMAL HIGH (ref 39.0–75.0)
WBC: 5.7 10*3/uL (ref 4.0–10.3)
lymph#: 0.8 10*3/uL — ABNORMAL LOW (ref 0.9–3.3)

## 2012-10-04 MED ORDER — SODIUM CHLORIDE 0.9 % IV SOLN
40.0000 mg/m2 | Freq: Once | INTRAVENOUS | Status: AC
  Administered 2012-10-04: 65 mg via INTRAVENOUS
  Filled 2012-10-04: qty 65

## 2012-10-04 MED ORDER — PALONOSETRON HCL INJECTION 0.25 MG/5ML
0.2500 mg | Freq: Once | INTRAVENOUS | Status: AC
  Administered 2012-10-04: 0.25 mg via INTRAVENOUS

## 2012-10-04 MED ORDER — SODIUM CHLORIDE 0.9 % IV SOLN
150.0000 mg | Freq: Once | INTRAVENOUS | Status: AC
  Administered 2012-10-04: 150 mg via INTRAVENOUS
  Filled 2012-10-04: qty 5

## 2012-10-04 MED ORDER — POTASSIUM CHLORIDE 2 MEQ/ML IV SOLN
Freq: Once | INTRAVENOUS | Status: AC
  Administered 2012-10-04: 10:00:00 via INTRAVENOUS
  Filled 2012-10-04: qty 10

## 2012-10-04 MED ORDER — SODIUM CHLORIDE 0.9 % IV SOLN
Freq: Once | INTRAVENOUS | Status: AC
  Administered 2012-10-04: 10:00:00 via INTRAVENOUS

## 2012-10-04 MED ORDER — DEXAMETHASONE SODIUM PHOSPHATE 4 MG/ML IJ SOLN
12.0000 mg | Freq: Once | INTRAMUSCULAR | Status: AC
  Administered 2012-10-04: 12 mg via INTRAVENOUS

## 2012-10-04 NOTE — Progress Notes (Signed)
Mr. Breithaupt received chemotherapy today and has completed 10 fractions to his neck.  He admits to being very exhausted since arriving at the center at 8 am.  He is traveling by wheelchair accompanied by his daughter.  He denies any pain presently.  His stoma is clear and skin intact in the treatment field with some hyperpigmentation

## 2012-10-04 NOTE — Progress Notes (Signed)
[facility]  Telephone:(336) [phone] Fax:(336) [phone]   OFFICE PROGRESS NOTE   Cc:  Gwynneth Aliment, MD  DIAGNOSIS:  Newly diagnosed stage IVA laryngeal cancer.   PAST THERAPY: total laryngectomy and right neck dissection on 08/13/2012.    CURRENT THERAPY: Started adjuvant chemoradiation on 09/20/12. Receiving chemotherapy with weekly Cisplatin.   INTERVAL HISTORY: Cody [name] 67 y.o. male returns for regular follow up with his daughter. Continues to tolerate chemo well. He still has good appetite and weight remains stable. He still needs to get help with shower and getting dressed from his daughter. He denies any pain in the tracheostomy site or discharge or foul odor. He denies any pain or the need any pain med. The rest of the 14 point review system was negative.  Past Medical History  Diagnosis Date  . TB (pulmonary tuberculosis) 1998  . Learning disabilities   . Cough   . Difficulty swallowing   . Pain on swallowing   . Abnormal weight loss documented 07/07/12     20 lbs  . Referred otalgia     Right Ear  . 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 Arthritis   . 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 Colonoscopy  2006    neg.   . Direct laryngoscopy  07/09/11    Epiglottic mass  . Direct laryngoscopy  07/08/2012    Procedure: DIRECT LARYNGOSCOPY;  Surgeon: Serena Colonel, MD;  Location: [facility] OR;  Service: ENT;  Laterality: N/A;  . Esophagoscopy  07/08/2012    Procedure: ESOPHAGOSCOPY;  Surgeon: Serena Colonel, MD;  Location: [facility] OR;  Service: ENT;  Laterality: N/A;  . Radical neck dissection Bilateral 08/13/2012    Procedure: RADICAL NECK DISSECTION;  Surgeon: Serena Colonel, MD;  Location: [facility] OR;  Service: ENT;  Laterality: Bilateral;  . Tracheostomy tube placement N/A 08/13/2012    Procedure: TRACHEOSTOMY;  Surgeon: Serena Colonel, MD;  Location: [facility]  OR;  Service: ENT;  Laterality: N/A;  . Pectoralis flap N/A 08/13/2012    Procedure: RIGHT PECTORALIS MYOCUTANEOUS FLAP to LARYNX;  Surgeon: Etter Sjogren, MD;  Location: [facility] OR;  Service: Plastics;  Laterality: N/A;  . Multiple extractions with alveoloplasty N/A 08/19/2012    Procedure: Extraction of tooth #'s 1,2,3,4,5,6,10,11,12,13,14,16,17,18,19,21,22,23, 24,25,26,27,28,29,30,31,32 with alveoloplasty and bilateral mandibular buccal exostoses reductions;  Surgeon: Charlynne Pander, DDS;  Location: [facility] OR;  Service: Oral Surgery;  Laterality: N/A;  . Laryngoscopy N/A 08/19/2012    Procedure: LARYNGOSCOPY;  Surgeon: Serena Colonel, MD;  Location: [facility] OR;  Service: ENT;  Laterality: N/A;  Anesthesia's Laryngoscope    Current Outpatient Prescriptions  Medication Sig Dispense Refill  . Alum & Mag Hydroxide-Simeth (MAGIC MOUTHWASH W/LIDOCAINE) SOLN 1part nystatin,1part Maaloxplus,1part benadryl,3part 2%viscous lidocaine. Swish/swallow 10 mL up to QID, before meals/bedtime  480 mL  5  . HYDROcodone-acetaminophen (HYCET) 7.5-325 mg/15 ml solution Take 15 mLs by mouth 4 (four) times daily as needed for pain.  480 mL  0  . LORazepam (ATIVAN) 0.5 MG tablet Take 0.5 mg by mouth every 6 (six) hours as needed (nausea or vomiting).      . ondansetron (ZOFRAN) 8 MG tablet Take 8 mg by mouth every 12 (twelve) hours as needed for nausea.      . prochlorperazine (COMPAZINE) 10 MG tablet Take 10 mg by mouth every  6 (six) hours as needed (nausea).      . sucralfate (CARAFATE) 1 G tablet crush 1 tablet in 10 mL H20 and swallow up to QID prn sore throat  30 tablet  5   No current facility-administered medications for this visit.   Facility-Administered Medications Ordered in Other Visits  Medication Dose Route Frequency Provider Last Rate Last Dose  . 0.9 %  sodium chloride infusion   Intravenous Once Exie Parody, MD      . dextrose 5 % and 0.45% NaCl 1,000 mL with potassium chloride 20 mEq, magnesium sulfate 12 mEq  infusion   Intravenous Once Exie Parody, MD        ALLERGIES:  has No Known Allergies.  REVIEW OF SYSTEMS:  The rest of the 14-point review of system was negative.   Filed Vitals:   10/04/12 0848  BP: 110/73  Pulse: 67  Temp: 97.2 F (36.2 C)  Resp: 16   Wt Readings from Last 3 Encounters:  10/04/12 122 lb 9.6 oz (55.611 kg)  09/27/12 120 lb 1.6 oz (54.477 kg)  09/24/12 121 lb 11.2 oz (55.203 kg)   ECOG Performance status: 1-2  PHYSICAL EXAMINATION:   General:  Thin-appearing man,  in no acute distress.  Eyes:  no scleral icterus.  ENT:  There were no oropharyngeal lesions.  Neck was without thyromegaly.  Right neck dissection scar and trach stoma were well healed without erythema, purulent discharge, or foul odor.  Lymphatics:  Negative cervical, supraclavicular or axillary adenopathy.  Respiratory: lungs were clear bilaterally without wheezing or crackles.  Cardiovascular:  Regular rate and rhythm, S1/S2, without murmur, rub or gallop.  There was no pedal edema.  GI:  abdomen was soft, flat, nontender, nondistended, without organomegaly.  PEG tube was in place without any erythema, discharge, or pain on palpation. Muscoloskeletal:  no spinal tenderness of palpation of vertebral spine.  Skin exam was without echymosis, petichae.  Neuro exam was nonfocal.  Patient was able to get on and off exam table without assistance.  Gait was slow and measured.  Patient was alerted and oriented.  Attention was good.   Language was appropriate.  Mood was normal without depression.  Speech was not pressured.  Thought content was not tangential.      LABORATORY/RADIOLOGY DATA:  Lab Results  Component Value Date   WBC 5.7 10/04/2012   HGB 10.8* 10/04/2012   HCT 32.5* 10/04/2012   PLT 239 10/04/2012   GLUCOSE 74 09/24/2012   ALKPHOS 82 09/20/2012   ALT 8 09/20/2012   AST 18 09/20/2012   NA 132* 09/24/2012   K 4.2 09/24/2012   CL 99 09/24/2012   CREATININE 1.1 09/24/2012   BUN 12.9 09/24/2012   CO2 22  09/24/2012   INR 1.08 09/14/2012     ASSESSMENT AND PLAN:   1. Diagnosis: Head and neck cancer. 2. Treatment: Weekly cisplatin chemotherapy and daily radiation to decrease the risk of recurrence head neck cancer. 3. Status: S/P 2 doses of weekly Cisplatin. Tolerating well. Recommend that he proceed with weekly Cisplatin without dose modification on 09/27/12. 4. Mouth sore:  To prevent early mouth sore from treatment, please perform salt/baking soda (1 teaspoon each in 1 Liter of water) mouth rinses about 3 times a day.  Continue liquid pain medication Hydrocodone/Acetaminophen as needed. 5. To prevent esophageal stricture: Please performs swallowing exercise as much as possible during the day at least 3 times a day. 6. Followup: In 1 week with CBC  and CMET.        The length of time of the face-to-face encounter was 15 minutes. More than 50% of time was spent counseling and coordination of care.

## 2012-10-04 NOTE — Patient Instructions (Signed)
[facility] Discharge Instructions for Patients Receiving Chemotherapy  Today you received the following chemotherapy agents: Cisplatin.  To help prevent nausea and vomiting after your treatment, we encourage you to take your nausea medication, Compazine (Prochlorperazine). Take one every six hours as needed for the next 72 hours.  For nausea on on or after 10/07/12, you may take Zofran.    If you develop nausea and vomiting that is not controlled by your nausea medication, call the clinic. If it is after clinic hours your family physician or the after hours number for the clinic or go to the Emergency Department.   BELOW ARE SYMPTOMS THAT SHOULD BE REPORTED IMMEDIATELY:  *FEVER GREATER THAN 100.5 F  *CHILLS WITH OR WITHOUT FEVER  NAUSEA AND VOMITING THAT IS NOT CONTROLLED WITH YOUR NAUSEA MEDICATION  *UNUSUAL SHORTNESS OF BREATH  *UNUSUAL BRUISING OR BLEEDING  TENDERNESS IN MOUTH AND THROAT WITH OR WITHOUT PRESENCE OF ULCERS  *URINARY PROBLEMS  *BOWEL PROBLEMS  UNUSUAL RASH Items with * indicate a potential emergency and should be followed up as soon as possible.  Feel free to call the clinic you have any questions or concerns. The clinic phone number is [phone].   I have been informed and understand all the instructions given to me. I know to contact the clinic, my physician, or go to the Emergency Department if any problems should occur. I do not have any questions at this time, but understand that I may call the clinic during office hours   should I have any questions or need assistance in obtaining follow up care.

## 2012-10-04 NOTE — Progress Notes (Signed)
Weekly Management Note:  Site: Stoma/bilateral neck Current Dose:   2200  cGy Projected Dose: 6000  cGy  Narrative: The patient is seen today for routine under treatment assessment. CBCT/MVCT images/port films were reviewed. The chart was reviewed.   He is without complaints today. He denies pain. He uses Radioplex gel along his neck. He is not taking any pain medications. His weight is stable.  Physical Examination:  Filed Vitals:   10/04/12 1710  BP: 124/71  Pulse: 72  Temp: 97.8 F (36.6 C)  .  Weight: 122 lb (55.339 kg). There is slight hyperpigmentation the skin along the neck/stoma. No areas of desquamation. The stoma site is clean. No palpable adenopathy in the neck.  Impression: Tolerating radiation therapy well.  Plan: Continue radiation therapy as planned.

## 2012-10-04 NOTE — Progress Notes (Signed)
Patient reports he continues to drink Ensure Plus at least 4 times daily. His weight has increased to 122.6 pounds on 4/28 from 120.1 pounds April 21. Patient states he continues to eat meals consisting of soft foods.  Nutrition diagnosis: Unintended weight loss has improved.  Intervention: I educated patient to continue Ensure Plus 4 times a day along with 3 meals daily to provide adequate calories and protein to minimize weight loss. I provided his second complimentary case of Ensure Plus.  Monitoring, evaluation, goals: Patient is tolerating oral diet with Ensure Plus 4 times a day. He has gained 2 pounds.  Next visit: Monday, May 19, during chemotherapy.

## 2012-10-05 ENCOUNTER — Ambulatory Visit
Admission: RE | Admit: 2012-10-05 | Discharge: 2012-10-05 | Disposition: A | Discharge status: Home / Self Care | Payer: Medicare Other | Source: Ambulatory Visit | Attending: Radiation Oncology | Admitting: Radiation Oncology

## 2012-10-05 ENCOUNTER — Ambulatory Visit: Payer: Medicare Other

## 2012-10-06 ENCOUNTER — Ambulatory Visit
Admission: RE | Admit: 2012-10-06 | Discharge: 2012-10-06 | Disposition: A | Discharge status: Home / Self Care | Payer: Medicare Other | Source: Ambulatory Visit | Attending: Radiation Oncology | Admitting: Radiation Oncology

## 2012-10-06 ENCOUNTER — Ambulatory Visit: Payer: Medicare Other

## 2012-10-07 ENCOUNTER — Ambulatory Visit
Admission: RE | Admit: 2012-10-07 | Discharge: 2012-10-07 | Disposition: A | Discharge status: Home / Self Care | Payer: Medicare Other | Source: Ambulatory Visit | Attending: Radiation Oncology | Admitting: Radiation Oncology

## 2012-10-07 ENCOUNTER — Ambulatory Visit: Payer: Medicare Other

## 2012-10-08 ENCOUNTER — Ambulatory Visit: Payer: Medicare Other

## 2012-10-08 ENCOUNTER — Ambulatory Visit
Admission: RE | Admit: 2012-10-08 | Discharge: 2012-10-08 | Disposition: A | Discharge status: Home / Self Care | Payer: Medicare Other | Source: Ambulatory Visit | Attending: Radiation Oncology | Admitting: Radiation Oncology

## 2012-10-11 ENCOUNTER — Ambulatory Visit: Payer: Self-pay | Admitting: Oncology

## 2012-10-11 ENCOUNTER — Encounter: Payer: Self-pay | Admitting: Oncology

## 2012-10-11 ENCOUNTER — Other Ambulatory Visit (HOSPITAL_BASED_OUTPATIENT_CLINIC_OR_DEPARTMENT_OTHER): Payer: Medicare Other | Admitting: Lab

## 2012-10-11 ENCOUNTER — Other Ambulatory Visit: Payer: Self-pay | Admitting: Lab

## 2012-10-11 ENCOUNTER — Ambulatory Visit
Admission: RE | Admit: 2012-10-11 | Discharge: 2012-10-11 | Disposition: A | Discharge status: Home / Self Care | Payer: Medicare Other | Source: Ambulatory Visit | Attending: Radiation Oncology | Admitting: Radiation Oncology

## 2012-10-11 ENCOUNTER — Ambulatory Visit (HOSPITAL_BASED_OUTPATIENT_CLINIC_OR_DEPARTMENT_OTHER): Payer: Medicare Other

## 2012-10-11 ENCOUNTER — Encounter: Payer: Self-pay | Admitting: Nutrition

## 2012-10-11 ENCOUNTER — Ambulatory Visit (HOSPITAL_BASED_OUTPATIENT_CLINIC_OR_DEPARTMENT_OTHER): Payer: Medicare Other | Admitting: Oncology

## 2012-10-11 ENCOUNTER — Encounter: Payer: Self-pay | Admitting: Radiation Oncology

## 2012-10-11 VITALS — BP 106/70 | HR 80 | Temp 98.6°F | Resp 16 | Ht 68.0 in | Wt 122.2 lb

## 2012-10-11 VITALS — BP 114/75 | HR 70 | Temp 97.4°F | Wt 122.0 lb

## 2012-10-11 DIAGNOSIS — C321 Malignant neoplasm of supraglottis: Secondary | ICD-10-CM

## 2012-10-11 DIAGNOSIS — Z5111 Encounter for antineoplastic chemotherapy: Secondary | ICD-10-CM

## 2012-10-11 LAB — CBC WITH DIFFERENTIAL/PLATELET
BASO%: 0.2 % (ref 0.0–2.0)
HCT: 32.3 % — ABNORMAL LOW (ref 38.4–49.9)
MCHC: 33.4 g/dL (ref 32.0–36.0)
MONO#: 0.5 10*3/uL (ref 0.1–0.9)
NEUT%: 72.1 % (ref 39.0–75.0)
WBC: 4.1 10*3/uL (ref 4.0–10.3)
lymph#: 0.6 10*3/uL — ABNORMAL LOW (ref 0.9–3.3)
nRBC: 0 % (ref 0–0)

## 2012-10-11 LAB — BASIC METABOLIC PANEL (CC13)
Chloride: 97 mEq/L — ABNORMAL LOW (ref 98–107)
Potassium: 4.6 mEq/L (ref 3.5–5.1)
Sodium: 130 mEq/L — ABNORMAL LOW (ref 136–145)

## 2012-10-11 MED ORDER — POTASSIUM CHLORIDE 2 MEQ/ML IV SOLN
Freq: Once | INTRAVENOUS | Status: AC
  Administered 2012-10-11: 12:00:00 via INTRAVENOUS
  Filled 2012-10-11: qty 10

## 2012-10-11 MED ORDER — DEXAMETHASONE SODIUM PHOSPHATE 20 MG/5ML IJ SOLN
12.0000 mg | Freq: Once | INTRAMUSCULAR | Status: AC
  Administered 2012-10-11: 12 mg via INTRAVENOUS

## 2012-10-11 MED ORDER — SODIUM CHLORIDE 0.9 % IV SOLN
Freq: Once | INTRAVENOUS | Status: AC
  Administered 2012-10-11: 12:00:00 via INTRAVENOUS

## 2012-10-11 MED ORDER — PALONOSETRON HCL INJECTION 0.25 MG/5ML
0.2500 mg | Freq: Once | INTRAVENOUS | Status: AC
  Administered 2012-10-11: 0.25 mg via INTRAVENOUS

## 2012-10-11 MED ORDER — SODIUM CHLORIDE 0.9 % IV SOLN
20.0000 mg/m2 | Freq: Once | INTRAVENOUS | Status: AC
  Administered 2012-10-11: 33 mg via INTRAVENOUS
  Filled 2012-10-11: qty 33

## 2012-10-11 MED ORDER — SODIUM CHLORIDE 0.9 % IV SOLN
150.0000 mg | Freq: Once | INTRAVENOUS | Status: AC
  Administered 2012-10-11: 150 mg via INTRAVENOUS
  Filled 2012-10-11: qty 5

## 2012-10-11 NOTE — Progress Notes (Signed)
[facility]  Telephone:(336) [phone] Fax:(336) [phone]   OFFICE PROGRESS NOTE   Cc:  Gwynneth Aliment, MD  DIAGNOSIS:  Newly diagnosed stage IVA laryngeal cancer.   PAST THERAPY: total laryngectomy and right neck dissection on 08/13/2012.    CURRENT THERAPY: Started adjuvant chemoradiation on 09/20/12. Receiving chemotherapy with weekly Cisplatin.   INTERVAL HISTORY: Cody [name] 67 y.o. male returns for regular follow up with his daughter. Continues to tolerate chemo well. He still has good appetite and weight remains stable. He still needs to get help with shower and getting dressed from his daughter. He denies any pain in the tracheostomy site or discharge or foul odor. He denies any pain or the need any pain med. Using salt/baking soda rinses and Biotene. He has mild nausea without vomiting. The rest of the 14 point review system was negative.  Past Medical History  Diagnosis Date  . TB (pulmonary tuberculosis) 1998  . Learning disabilities   . Cough   . Difficulty swallowing   . Pain on swallowing   . Abnormal weight loss documented 07/07/12     20 lbs  . Referred otalgia     Right Ear  . 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 Arthritis   . 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 Colonoscopy  2006    neg.   . Direct laryngoscopy  07/09/11    Epiglottic mass  . Direct laryngoscopy  07/08/2012    Procedure: DIRECT LARYNGOSCOPY;  Surgeon: Serena Colonel, MD;  Location: [facility] OR;  Service: ENT;  Laterality: N/A;  . Esophagoscopy  07/08/2012    Procedure: ESOPHAGOSCOPY;  Surgeon: Serena Colonel, MD;  Location: [facility] OR;  Service: ENT;  Laterality: N/A;  . Radical neck dissection Bilateral 08/13/2012    Procedure: RADICAL NECK DISSECTION;  Surgeon: Serena Colonel, MD;  Location: [facility] OR;  Service: ENT;  Laterality: Bilateral;  . Tracheostomy tube placement  N/A 08/13/2012    Procedure: TRACHEOSTOMY;  Surgeon: Serena Colonel, MD;  Location: [facility] OR;  Service: ENT;  Laterality: N/A;  . Pectoralis flap N/A 08/13/2012    Procedure: RIGHT PECTORALIS MYOCUTANEOUS FLAP to LARYNX;  Surgeon: Etter Sjogren, MD;  Location: [facility] OR;  Service: Plastics;  Laterality: N/A;  . Multiple extractions with alveoloplasty N/A 08/19/2012    Procedure: Extraction of tooth #'s 1,2,3,4,5,6,10,11,12,13,14,16,17,18,19,21,22,23, 24,25,26,27,28,29,30,31,32 with alveoloplasty and bilateral mandibular buccal exostoses reductions;  Surgeon: Charlynne Pander, DDS;  Location: [facility] OR;  Service: Oral Surgery;  Laterality: N/A;  . Laryngoscopy N/A 08/19/2012    Procedure: LARYNGOSCOPY;  Surgeon: Serena Colonel, MD;  Location: [facility] OR;  Service: ENT;  Laterality: N/A;  Anesthesia's Laryngoscope    Current Outpatient Prescriptions  Medication Sig Dispense Refill  . Alum & Mag Hydroxide-Simeth (MAGIC MOUTHWASH W/LIDOCAINE) SOLN 1part nystatin,1part Maaloxplus,1part benadryl,3part 2%viscous lidocaine. Swish/swallow 10 mL up to QID, before meals/bedtime  480 mL  5  . HYDROcodone-acetaminophen (HYCET) 7.5-325 mg/15 ml solution Take 15 mLs by mouth 4 (four) times daily as needed for pain.  480 mL  0  . LORazepam (ATIVAN) 0.5 MG tablet Take 0.5 mg by mouth every 6 (six) hours as needed (nausea or vomiting).      . ondansetron (ZOFRAN) 8 MG tablet Take 8 mg by mouth every 12 (twelve) hours as needed for nausea.      Marland Kitchen  prochlorperazine (COMPAZINE) 10 MG tablet Take 10 mg by mouth every 6 (six) hours as needed (nausea).      . sucralfate (CARAFATE) 1 G tablet crush 1 tablet in 10 mL H20 and swallow up to QID prn sore throat  30 tablet  5   No current facility-administered medications for this visit.    ALLERGIES:  has No Known Allergies.  REVIEW OF SYSTEMS:  The rest of the 14-point review of system was negative.   Filed Vitals:   10/11/12 0851  BP: 106/70  Pulse: 80  Temp: 98.6 F (37 C)  Resp: 16    Wt Readings from Last 3 Encounters:  10/11/12 122 lb 3.2 oz (55.43 kg)  10/04/12 122 lb (55.339 kg)  10/04/12 122 lb 9.6 oz (55.611 kg)   ECOG Performance status: 1-2  PHYSICAL EXAMINATION:   General:  Thin-appearing man,  in no acute distress.  Eyes:  no scleral icterus.  ENT:  There were no oropharyngeal lesions.  Neck was without thyromegaly.  Right neck dissection scar and trach stoma were well healed without erythema, purulent discharge, or foul odor.  Lymphatics:  Negative cervical, supraclavicular or axillary adenopathy.  Respiratory: lungs were clear bilaterally without wheezing or crackles.  Cardiovascular:  Regular rate and rhythm, S1/S2, without murmur, rub or gallop.  There was no pedal edema.  GI:  abdomen was soft, flat, nontender, nondistended, without organomegaly.  PEG tube was in place without any erythema, discharge, or pain on palpation. Muscoloskeletal:  no spinal tenderness of palpation of vertebral spine.  Skin exam was without echymosis, petichae.  Neuro exam was nonfocal.  Patient was able to get on and off exam table without assistance.  Gait was slow and measured.  Patient was alerted and oriented.  Attention was good.   Language was appropriate.  Mood was normal without depression.  Speech was not pressured.  Thought content was not tangential.      LABORATORY/RADIOLOGY DATA:  Lab Results  Component Value Date   WBC 4.1 10/11/2012   HGB 10.8* 10/11/2012   HCT 32.3* 10/11/2012   PLT 175 10/11/2012   GLUCOSE 79 10/04/2012   ALKPHOS 82 09/20/2012   ALT 8 09/20/2012   AST 18 09/20/2012   NA 132* 10/04/2012   K 4.6 10/04/2012   CL 97* 10/04/2012   CREATININE 1.2 10/04/2012   BUN 14.5 10/04/2012   CO2 27 10/04/2012   INR 1.08 09/14/2012     ASSESSMENT AND PLAN:   1. Diagnosis: Head and neck cancer. 2. Treatment: Weekly cisplatin chemotherapy and daily radiation to decrease the risk of recurrence head neck cancer. 3. Status: S/P 3 doses of weekly Cisplatin. Tolerating  well. He has grade 1 nausea. Recommend that he proceed with weekly Cisplatin without dose modification. 4. Mouth sore:  To prevent early mouth sore from treatment, please perform salt/baking soda (1 teaspoon each in 1 Liter of water) mouth rinses about 3 times a day.  Continue liquid pain medication Hydrocodone/Acetaminophen as needed. If he is using pain medication 3-4 times per day routinely, will add on Fentanyl patch. 5. To prevent esophageal stricture: Please performs swallowing exercise as much as possible during the day at least 3 times a day. 6. Followup: In 1 week with CBC and CMET.        The length of time of the face-to-face encounter was 15 minutes. More than 50% of time was spent counseling and coordination of care.

## 2012-10-11 NOTE — Progress Notes (Signed)
   Weekly Management Note:  outpatient Current Dose:  32 Gy  Projected Dose: 60 Gy   Narrative:  The patient presents for routine under treatment assessment.  CBCT/MVCT images/Port film x-rays were reviewed.  The chart was checked. Receiving infusion today.  Sore throat tolerable - hydrocodone elixir as need, MMW QID. Doing well  Per daughter.  Physical Findings:  weight is 122 lb (55.339 kg). His temperature is 97.4 F (36.3 C). His blood pressure is 114/75 and his pulse is 70.  NAD, stoma with small area of moist desquamation, no active bleeding. No mucositis in oropharynx appreciated - though patient could not relax tongue on exam.  CBC    Component Value Date/Time   WBC 4.1 10/11/2012 0829   WBC 9.0 09/14/2012 0805   RBC 3.53* 10/11/2012 0829   RBC 3.61* 09/14/2012 0805   HGB 10.8* 10/11/2012 0829   HGB 11.3* 09/14/2012 0805   HCT 32.3* 10/11/2012 0829   HCT 33.7* 09/14/2012 0805   PLT 175 10/11/2012 0829   PLT 228 09/14/2012 0805   MCV 91.5 10/11/2012 0829   MCV 93.4 09/14/2012 0805   MCH 30.6 10/11/2012 0829   MCH 31.3 09/14/2012 0805   MCHC 33.4 10/11/2012 0829   MCHC 33.5 09/14/2012 0805   RDW 15.6* 10/11/2012 0829   RDW 16.0* 09/14/2012 0805   LYMPHSABS 0.6* 10/11/2012 0829   LYMPHSABS 1.3 07/03/2012 1937   MONOABS 0.5 10/11/2012 0829   MONOABS 0.7 07/03/2012 1937   EOSABS 0.1 10/11/2012 0829   EOSABS 0.1 07/03/2012 1937   BASOSABS 0.0 10/11/2012 0829   BASOSABS 0.0 07/03/2012 1937     CMP     Component Value Date/Time   NA 132* 10/04/2012 0830   NA 136 08/19/2012 0625   K 4.6 10/04/2012 0830   K 4.3 08/19/2012 0625   CL 97* 10/04/2012 0830   CL 100 08/19/2012 0625   CO2 27 10/04/2012 0830   CO2 28 08/19/2012 0625   GLUCOSE 79 10/04/2012 0830   GLUCOSE 109* 08/19/2012 0625   BUN 14.5 10/04/2012 0830   BUN 9 08/19/2012 0625   CREATININE 1.2 10/04/2012 0830   CREATININE 0.69 08/19/2012 0625   CALCIUM 9.5 10/04/2012 0830   CALCIUM 8.3* 08/20/2012 0542   PROT 9.5* 09/20/2012 0952   PROT 6.9 08/19/2012 0625   ALBUMIN 3.2* 09/20/2012 0952   ALBUMIN 2.4* 08/19/2012 0625   AST 18 09/20/2012 0952   AST 25 08/19/2012 0625   ALT 8 09/20/2012 0952   ALT 20 08/19/2012 0625   ALKPHOS 82 09/20/2012 0952   ALKPHOS 57 08/19/2012 0625   BILITOT 0.92 09/20/2012 0952   BILITOT 0.6 08/19/2012 0625   GFRNONAA >90 08/19/2012 0625   GFRAA >90 08/19/2012 1610      Impression:  The patient is tolerating radiotherapy.  Plan:  Continue radiotherapy as planned.  ________________________________   Lonie Peak, M.D.

## 2012-10-11 NOTE — Patient Instructions (Addendum)
[facility] Discharge Instructions for Patients Receiving Chemotherapy  Today you received the following chemotherapy agents Cisplatin.  To help prevent nausea and vomiting after your treatment, we encourage you to take your nausea medication.  If you develop nausea and vomiting that is not controlled by your nausea medication, call the clinic. If it is after clinic hours your family physician or the after hours number for the clinic or go to the Emergency Department.   BELOW ARE SYMPTOMS THAT SHOULD BE REPORTED IMMEDIATELY:  *FEVER GREATER THAN 100.5 F  *CHILLS WITH OR WITHOUT FEVER  NAUSEA AND VOMITING THAT IS NOT CONTROLLED WITH YOUR NAUSEA MEDICATION  *UNUSUAL SHORTNESS OF BREATH  *UNUSUAL BRUISING OR BLEEDING  TENDERNESS IN MOUTH AND THROAT WITH OR WITHOUT PRESENCE OF ULCERS  *URINARY PROBLEMS  *BOWEL PROBLEMS  UNUSUAL RASH Items with * indicate a potential emergency and should be followed up as soon as possible.  One of the nurses will contact you 24 hours after your treatment. Please let the nurse know about any problems that you may have experienced. Feel free to call the clinic you have any questions or concerns. The clinic phone number is [phone].   I have been informed and understand all the instructions given to me. I know to contact the clinic, my physician, or go to the Emergency Department if any problems should occur. I do not have any questions at this time, but understand that I may call the clinic during office hours   should I have any questions or need assistance in obtaining follow up care.    __________________________________________  _____________  __________ Signature of Patient or Authorized Representative            Date                   Time    __________________________________________ Nurse's Signature

## 2012-10-11 NOTE — Progress Notes (Addendum)
Mr. Pieroni has received 13 fractions to his neck.  Note small area of moist desquamation at the stoma.  No active bleeding noted in this area.  C/o sore throat as a level 2-3 on a scale of 0-10.   Peg tube site clean with small area of irritation on the left abd. Under the flange, therefore, the flange was pulled back to decrease pressure.

## 2012-10-12 ENCOUNTER — Encounter: Payer: Self-pay | Admitting: Oncology

## 2012-10-12 ENCOUNTER — Ambulatory Visit
Admission: RE | Admit: 2012-10-12 | Discharge: 2012-10-12 | Disposition: A | Discharge status: Home / Self Care | Payer: Medicare Other | Source: Ambulatory Visit | Attending: Radiation Oncology | Admitting: Radiation Oncology

## 2012-10-12 NOTE — Progress Notes (Signed)
Lauren left a message. Felicia the daughter inquired about gas card. I called her and left a message to call me back. Gas cards are 50.00 and her dad does have funds.

## 2012-10-13 ENCOUNTER — Ambulatory Visit
Admission: RE | Admit: 2012-10-13 | Discharge: 2012-10-13 | Disposition: A | Discharge status: Home / Self Care | Payer: Medicare Other | Source: Ambulatory Visit | Attending: Radiation Oncology | Admitting: Radiation Oncology

## 2012-10-14 ENCOUNTER — Ambulatory Visit
Admission: RE | Admit: 2012-10-14 | Discharge: 2012-10-14 | Disposition: A | Discharge status: Home / Self Care | Payer: Medicare Other | Source: Ambulatory Visit | Attending: Radiation Oncology | Admitting: Radiation Oncology

## 2012-10-15 ENCOUNTER — Ambulatory Visit
Admission: RE | Admit: 2012-10-15 | Discharge: 2012-10-15 | Disposition: A | Discharge status: Home / Self Care | Payer: Medicare Other | Source: Ambulatory Visit | Attending: Radiation Oncology | Admitting: Radiation Oncology

## 2012-10-15 ENCOUNTER — Ambulatory Visit (HOSPITAL_BASED_OUTPATIENT_CLINIC_OR_DEPARTMENT_OTHER): Payer: Medicare Other | Admitting: Oncology

## 2012-10-15 ENCOUNTER — Other Ambulatory Visit (HOSPITAL_BASED_OUTPATIENT_CLINIC_OR_DEPARTMENT_OTHER): Payer: Medicare Other

## 2012-10-15 VITALS — BP 100/62 | HR 69 | Temp 98.5°F | Resp 20 | Ht 68.0 in | Wt 120.6 lb

## 2012-10-15 DIAGNOSIS — K5900 Constipation, unspecified: Secondary | ICD-10-CM

## 2012-10-15 DIAGNOSIS — R634 Abnormal weight loss: Secondary | ICD-10-CM

## 2012-10-15 DIAGNOSIS — C321 Malignant neoplasm of supraglottis: Secondary | ICD-10-CM

## 2012-10-15 DIAGNOSIS — K117 Disturbances of salivary secretion: Secondary | ICD-10-CM

## 2012-10-15 DIAGNOSIS — K1230 Oral mucositis (ulcerative), unspecified: Secondary | ICD-10-CM

## 2012-10-15 LAB — BASIC METABOLIC PANEL (CC13)
BUN: 16.1 mg/dL (ref 7.0–26.0)
CO2: 26 mEq/L (ref 22–29)
Calcium: 8.7 mg/dL (ref 8.4–10.4)
Glucose: 91 mg/dl (ref 70–99)

## 2012-10-15 LAB — CBC WITH DIFFERENTIAL/PLATELET
Basophils Absolute: 0 10*3/uL (ref 0.0–0.1)
Eosinophils Absolute: 0 10*3/uL (ref 0.0–0.5)
HCT: 31 % — ABNORMAL LOW (ref 38.4–49.9)
HGB: 10.5 g/dL — ABNORMAL LOW (ref 13.0–17.1)
LYMPH%: 10.3 % — ABNORMAL LOW (ref 14.0–49.0)
MCV: 92.5 fL (ref 79.3–98.0)
MONO%: 12.2 % (ref 0.0–14.0)
NEUT#: 1.7 10*3/uL (ref 1.5–6.5)
Platelets: 156 10*3/uL (ref 140–400)

## 2012-10-15 MED ORDER — POLYETHYLENE GLYCOL 3350 17 G PO PACK: 17.0000 g | PACK | ORAL | Status: DC | PRN

## 2012-10-15 NOTE — Progress Notes (Signed)
[facility]  Telephone:(336) [phone] Fax:(336) [phone]   OFFICE PROGRESS NOTE   Cc:  Gwynneth Aliment, MD  DIAGNOSIS:  Newly diagnosed stage IVA laryngeal cancer.   PAST THERAPY: total laryngectomy and right neck dissection on 08/13/2012.    CURRENT THERAPY:  Pending start of adjuvant chemoradiation.   INTERVAL HISTORY: Cody [name] 67 y.o. male returns for regular follow up with his daughter.  He reports anorexia.  Nothing tastes good.  He is trying to push himself.  He has not used his PEG tube yet.  He is losing weight.  He has mild mucositis.  He is performing swallowing exercise on routinely.  He has thick phlegm. He is using salt/baking soda alternating with magic mouth wash.  He has mild fatigue; however, he is still working with PT.  He has had some nausea but no vomiting.  He has been having constipation and asked for laxative.   Patient denies fever, headache, visual changes, confusion, drenching night sweats, palpable lymph node swelling, mucositis, odynophagia, dysphagia, nausea vomiting, jaundice, chest pain, palpitation, shortness of breath, dyspnea on exertion, productive cough, gum bleeding, epistaxis, hematemesis, hemoptysis, abdominal pain, abdominal swelling, early satiety, melena, hematochezia, hematuria, skin rash, spontaneous bleeding, joint swelling, joint pain, heat or cold intolerance, bowel bladder incontinence, back pain, focal motor weakness, paresthesia.    Past Medical History  Diagnosis Date  . TB (pulmonary tuberculosis) 1998  . Learning disabilities   . Cough   . Difficulty swallowing   . Pain on swallowing   . Abnormal weight loss documented 07/07/12     20 lbs  . Referred otalgia     Right Ear  . 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 Arthritis   . 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 Colonoscopy  2006     neg.   . Direct laryngoscopy  07/09/11    Epiglottic mass  . Direct laryngoscopy  07/08/2012    Procedure: DIRECT LARYNGOSCOPY;  Surgeon: Serena Colonel, MD;  Location: [facility] OR;  Service: ENT;  Laterality: N/A;  . Esophagoscopy  07/08/2012    Procedure: ESOPHAGOSCOPY;  Surgeon: Serena Colonel, MD;  Location: [facility] OR;  Service: ENT;  Laterality: N/A;  . Radical neck dissection Bilateral 08/13/2012    Procedure: RADICAL NECK DISSECTION;  Surgeon: Serena Colonel, MD;  Location: [facility] OR;  Service: ENT;  Laterality: Bilateral;  . Tracheostomy tube placement N/A 08/13/2012    Procedure: TRACHEOSTOMY;  Surgeon: Serena Colonel, MD;  Location: [facility] OR;  Service: ENT;  Laterality: N/A;  . Pectoralis flap N/A 08/13/2012    Procedure: RIGHT PECTORALIS MYOCUTANEOUS FLAP to LARYNX;  Surgeon: Etter Sjogren, MD;  Location: [facility] OR;  Service: Plastics;  Laterality: N/A;  . Multiple extractions with alveoloplasty N/A 08/19/2012    Procedure: Extraction of tooth #'s 1,2,3,4,5,6,10,11,12,13,14,16,17,18,19,21,22,23, 24,25,26,27,28,29,30,31,32 with alveoloplasty and bilateral mandibular buccal exostoses reductions;  Surgeon: Charlynne Pander, DDS;  Location: [facility] OR;  Service: Oral Surgery;  Laterality: N/A;  . Laryngoscopy N/A 08/19/2012    Procedure: LARYNGOSCOPY;  Surgeon: Serena Colonel, MD;  Location: [facility] OR;  Service: ENT;  Laterality: N/A;  Anesthesia's Laryngoscope    Current Outpatient Prescriptions  Medication Sig Dispense Refill  . Alum & Mag Hydroxide-Simeth (MAGIC MOUTHWASH W/LIDOCAINE) SOLN 1part nystatin,1part Maaloxplus,1part benadryl,3part 2%viscous lidocaine. Swish/swallow 10 mL up to QID, before meals/bedtime  480 mL  5  . HYDROcodone-acetaminophen (HYCET) 7.5-325 mg/15 ml solution Take 15 mLs by mouth 4 (four) times daily as needed for pain.  480 mL  0  . LORazepam (ATIVAN) 0.5 MG tablet Take 0.5 mg by mouth every 6 (six) hours as needed (nausea or vomiting).      . ondansetron (ZOFRAN) 8 MG tablet Take 8 mg by mouth every 12  (twelve) hours as needed for nausea.      . prochlorperazine (COMPAZINE) 10 MG tablet Take 10 mg by mouth every 6 (six) hours as needed (nausea).      . sucralfate (CARAFATE) 1 G tablet crush 1 tablet in 10 mL H20 and swallow up to QID prn sore throat  30 tablet  5  . polyethylene glycol (MIRALAX) packet Take 17 g by mouth every other day as needed (severe constipation.).  14 each  5   No current facility-administered medications for this visit.    ALLERGIES:  has No Known Allergies.  REVIEW OF SYSTEMS:  The rest of the 14-point review of system was negative.   Filed Vitals:   10/15/12 1348  BP: 100/62  Pulse: 69  Temp: 98.5 F (36.9 C)  Resp: 20   Wt Readings from Last 3 Encounters:  10/15/12 120 lb 9.6 oz (54.704 kg)  10/11/12 122 lb (55.339 kg)  10/11/12 122 lb 3.2 oz (55.43 kg)   ECOG Performance status: 1-2  PHYSICAL EXAMINATION:   General:  Thin-appearing man,  in no acute distress.  Eyes:  no scleral icterus.  ENT:  There were no oropharyngeal lesions.  Neck was without thyromegaly.  Right neck dissection scar and trach stoma were well healed without erythema, purulent discharge, or foul odor.  Lymphatics:  Negative cervical, supraclavicular or axillary adenopathy.  Respiratory: lungs were clear bilaterally without wheezing or crackles.  Cardiovascular:  Regular rate and rhythm, S1/S2, without murmur, rub or gallop.  There was no pedal edema.  GI:  abdomen was soft, flat, nontender, nondistended, without organomegaly.  PEG tube was in place without any erythema, discharge, or pain on palpation. Muscoloskeletal:  no spinal tenderness of palpation of vertebral spine.  Skin exam was without echymosis, petichae.  Neuro exam was nonfocal.  Patient was able to get on and off exam table without assistance.  Gait was slow and measured.  Patient was alerted and oriented.  Attention was good.   Language was appropriate.  Mood was normal without depression.  Speech was not pressured.   Thought content was not tangential.         LABORATORY/RADIOLOGY DATA:  Lab Results  Component Value Date   WBC 2.3* 10/15/2012   HGB 10.5* 10/15/2012   HCT 31.0* 10/15/2012   PLT 156 10/15/2012   GLUCOSE 91 10/15/2012   ALKPHOS 82 09/20/2012   ALT 8 09/20/2012   AST 18 09/20/2012   NA 132* 10/15/2012   K 4.2 10/15/2012   CL 98 10/15/2012   CREATININE 1.2 10/15/2012   BUN 16.1 10/15/2012   CO2 26 10/15/2012   INR 1.08 09/14/2012     ASSESSMENT AND PLAN:   1. Diagnosis: Head and neck cancer. 2. Treatment: Weekly cisplatin chemotherapy and daily radiation 3. Status: he is past the middle point of the adjuvant plan.  He is having grade 1-2 anorexia/weight loss, grade 1 mucositis, grade 1-2 xerostomia.  There are expected side effects.  I recommended that he proceed with next cycle of weekly Cisplatin without modification.  In the future, if his  neutropenia significantly worsens, we may consider stopping chemo.  4. Xerostomia:  I advised him to add diluted H2O2 (1:4) to thin out his thick oral secretion in addition to magic mouth wash and salt/baking soda  5. To prevent esophageal stricture: I again advised him to continue swallowing exercise as much as possible.  6. Anorexia/weight loss:  I advised him to supplement his diet with Ensure. 7. Renal insufficiency:  Grade 1 due to chemo.  It is better today.  I advised him to take at least 60-oz of fluid a day.  8. Followup: In 1 week with CBC and CMET.     I informed Dr. Laural Benes and his daughter that I am leaving the practice in July 2014.  [facility] will [facility] for him to see another provider in July 2014.     The length of time of the face-to-face encounter was 15 minutes. More than 50% of time was spent counseling and coordination of care.

## 2012-10-18 ENCOUNTER — Other Ambulatory Visit: Payer: Self-pay | Admitting: Lab

## 2012-10-18 ENCOUNTER — Ambulatory Visit (HOSPITAL_BASED_OUTPATIENT_CLINIC_OR_DEPARTMENT_OTHER): Payer: Medicare Other

## 2012-10-18 ENCOUNTER — Ambulatory Visit: Payer: Self-pay | Admitting: Oncology

## 2012-10-18 ENCOUNTER — Ambulatory Visit
Admission: RE | Admit: 2012-10-18 | Discharge: 2012-10-18 | Disposition: A | Discharge status: Home / Self Care | Payer: Medicare Other | Source: Ambulatory Visit | Attending: Radiation Oncology | Admitting: Radiation Oncology

## 2012-10-18 VITALS — BP 128/65 | HR 77 | Temp 97.5°F | Resp 17

## 2012-10-18 DIAGNOSIS — Z5111 Encounter for antineoplastic chemotherapy: Secondary | ICD-10-CM

## 2012-10-18 DIAGNOSIS — C321 Malignant neoplasm of supraglottis: Secondary | ICD-10-CM

## 2012-10-18 MED ORDER — DEXAMETHASONE SODIUM PHOSPHATE 20 MG/5ML IJ SOLN
12.0000 mg | Freq: Once | INTRAMUSCULAR | Status: AC
  Administered 2012-10-18: 12 mg via INTRAVENOUS

## 2012-10-18 MED ORDER — SODIUM CHLORIDE 0.9 % IV SOLN
20.0000 mg/m2 | Freq: Once | INTRAVENOUS | Status: AC
  Administered 2012-10-18: 33 mg via INTRAVENOUS
  Filled 2012-10-18: qty 33

## 2012-10-18 MED ORDER — SODIUM CHLORIDE 0.9 % IV SOLN
Freq: Once | INTRAVENOUS | Status: AC
  Administered 2012-10-18: 10:00:00 via INTRAVENOUS

## 2012-10-18 MED ORDER — FOSAPREPITANT DIMEGLUMINE INJECTION 150 MG
150.0000 mg | Freq: Once | INTRAVENOUS | Status: AC
  Administered 2012-10-18: 150 mg via INTRAVENOUS
  Filled 2012-10-18: qty 5

## 2012-10-18 MED ORDER — PALONOSETRON HCL INJECTION 0.25 MG/5ML
0.2500 mg | Freq: Once | INTRAVENOUS | Status: AC
  Administered 2012-10-18: 0.25 mg via INTRAVENOUS

## 2012-10-18 MED ORDER — POTASSIUM CHLORIDE 2 MEQ/ML IV SOLN
Freq: Once | INTRAVENOUS | Status: AC
  Administered 2012-10-18: 10:00:00 via INTRAVENOUS
  Filled 2012-10-18: qty 10

## 2012-10-18 NOTE — Patient Instructions (Addendum)
[facility] Discharge Instructions for Patients Receiving Chemotherapy  Today you received the following chemotherapy agents Cisplatin.  To help prevent nausea and vomiting after your treatment, we encourage you to take your nausea medication as prescribed.   If you develop nausea and vomiting that is not controlled by your nausea medication, call the clinic. If it is after clinic hours your family physician or the after hours number for the clinic or go to the Emergency Department.   BELOW ARE SYMPTOMS THAT SHOULD BE REPORTED IMMEDIATELY:  *FEVER GREATER THAN 100.5 F  *CHILLS WITH OR WITHOUT FEVER  NAUSEA AND VOMITING THAT IS NOT CONTROLLED WITH YOUR NAUSEA MEDICATION  *UNUSUAL SHORTNESS OF BREATH  *UNUSUAL BRUISING OR BLEEDING  TENDERNESS IN MOUTH AND THROAT WITH OR WITHOUT PRESENCE OF ULCERS  *URINARY PROBLEMS  *BOWEL PROBLEMS  UNUSUAL RASH Items with * indicate a potential emergency and should be followed up as soon as possible.  Feel free to call the clinic you have any questions or concerns. The clinic phone number is [phone].   I have been informed and understand all the instructions given to me. I know to contact the clinic, my physician, or go to the Emergency Department if any problems should occur. I do not have any questions at this time, but understand that I may call the clinic during office hours   should I have any questions or need assistance in obtaining follow up care.    __________________________________________  _____________  __________ Signature of Patient or Authorized Representative            Date                   Time    __________________________________________ Nurse's Signature    

## 2012-10-19 ENCOUNTER — Ambulatory Visit
Admission: RE | Admit: 2012-10-19 | Discharge: 2012-10-19 | Disposition: A | Discharge status: Home / Self Care | Payer: Medicare Other | Source: Ambulatory Visit | Attending: Radiation Oncology | Admitting: Radiation Oncology

## 2012-10-19 ENCOUNTER — Encounter: Payer: Self-pay | Admitting: Radiation Oncology

## 2012-10-19 VITALS — BP 137/66 | HR 67 | Temp 97.4°F | Ht 68.0 in | Wt 124.0 lb

## 2012-10-19 DIAGNOSIS — C321 Malignant neoplasm of supraglottis: Secondary | ICD-10-CM

## 2012-10-19 NOTE — Progress Notes (Signed)
   Weekly Management Note:  outpatient Current Dose:  44 Gy  Projected Dose: 60 Gy   Narrative:  The patient presents for routine under treatment assessment.  CBCT/MVCT images/Port film x-rays were reviewed.  The chart was checked. Doing well.  Symptoms seem to have plateaued. No new complaints. Received chemotherapy (CDDP weekly) yesterday.  May have last cycle held.   Physical Findings:  height is 5\' 8"  (1.727 m) and weight is 124 lb (56.246 kg). His temperature is 97.4 F (36.3 C). His blood pressure is 137/66 and his pulse is 67.  Oropharynx - erythema, no thrush.  Neck - hyperpigmentation.  Stoma - a little dried blood.  CBC    Component Value Date/Time   WBC 2.3* 10/15/2012 1310   WBC 9.0 09/14/2012 0805   RBC 3.35* 10/15/2012 1310   RBC 3.61* 09/14/2012 0805   HGB 10.5* 10/15/2012 1310   HGB 11.3* 09/14/2012 0805   HCT 31.0* 10/15/2012 1310   HCT 33.7* 09/14/2012 0805   PLT 156 10/15/2012 1310   PLT 228 09/14/2012 0805   MCV 92.5 10/15/2012 1310   MCV 93.4 09/14/2012 0805   MCH 31.2 10/15/2012 1310   MCH 31.3 09/14/2012 0805   MCHC 33.7 10/15/2012 1310   MCHC 33.5 09/14/2012 0805   RDW 16.4* 10/15/2012 1310   RDW 16.0* 09/14/2012 0805   LYMPHSABS 0.2* 10/15/2012 1310   LYMPHSABS 1.3 07/03/2012 1937   MONOABS 0.3 10/15/2012 1310   MONOABS 0.7 07/03/2012 1937   EOSABS 0.0 10/15/2012 1310   EOSABS 0.1 07/03/2012 1937   BASOSABS 0.0 10/15/2012 1310   BASOSABS 0.0 07/03/2012 1937    CMP     Component Value Date/Time   NA 132* 10/15/2012 1310   NA 136 08/19/2012 0625   K 4.2 10/15/2012 1310   K 4.3 08/19/2012 0625   CL 98 10/15/2012 1310   CL 100 08/19/2012 0625   CO2 26 10/15/2012 1310   CO2 28 08/19/2012 0625   GLUCOSE 91 10/15/2012 1310   GLUCOSE 109* 08/19/2012 0625   BUN 16.1 10/15/2012 1310   BUN 9 08/19/2012 0625   CREATININE 1.2 10/15/2012 1310   CREATININE 0.69 08/19/2012 0625   CALCIUM 8.7 10/15/2012 1310   CALCIUM 8.3* 08/20/2012 0542   PROT 9.5* 09/20/2012 0952   PROT 6.9 08/19/2012 0625   ALBUMIN 3.2* 09/20/2012  0952   ALBUMIN 2.4* 08/19/2012 0625   AST 18 09/20/2012 0952   AST 25 08/19/2012 0625   ALT 8 09/20/2012 0952   ALT 20 08/19/2012 0625   ALKPHOS 82 09/20/2012 0952   ALKPHOS 57 08/19/2012 0625   BILITOT 0.92 09/20/2012 0952   BILITOT 0.6 08/19/2012 0625   GFRNONAA >90 08/19/2012 0625   GFRAA >90 08/19/2012 1610    Impression:  The patient is tolerating radiotherapy.  Plan:  Continue radiotherapy as planned.  ________________________________   Lonie Peak, M.D.

## 2012-10-20 ENCOUNTER — Ambulatory Visit
Admission: RE | Admit: 2012-10-20 | Discharge: 2012-10-20 | Disposition: A | Discharge status: Home / Self Care | Payer: Medicare Other | Source: Ambulatory Visit | Attending: Radiation Oncology | Admitting: Radiation Oncology

## 2012-10-21 ENCOUNTER — Ambulatory Visit
Admission: RE | Admit: 2012-10-21 | Discharge: 2012-10-21 | Disposition: A | Discharge status: Home / Self Care | Payer: Medicare Other | Source: Ambulatory Visit | Attending: Radiation Oncology | Admitting: Radiation Oncology

## 2012-10-22 ENCOUNTER — Other Ambulatory Visit: Payer: Self-pay | Admitting: Oncology

## 2012-10-22 ENCOUNTER — Other Ambulatory Visit: Payer: Self-pay | Admitting: *Deleted

## 2012-10-22 ENCOUNTER — Ambulatory Visit
Admission: RE | Admit: 2012-10-22 | Discharge: 2012-10-22 | Disposition: A | Discharge status: Home / Self Care | Payer: Medicare Other | Source: Ambulatory Visit | Attending: Radiation Oncology | Admitting: Radiation Oncology

## 2012-10-22 MED ORDER — HYDROCODONE-ACETAMINOPHEN 7.5-325 MG/15ML PO SOLN: 15.0000 mL | Freq: Four times a day (QID) | ORAL | Status: DC | PRN

## 2012-10-22 NOTE — Telephone Encounter (Signed)
Informed pt's daughter of refill on Hydrocodone called into [facility] on [address]. She verbalized understanding.

## 2012-10-25 ENCOUNTER — Ambulatory Visit: Payer: Self-pay | Admitting: Oncology

## 2012-10-25 ENCOUNTER — Telehealth: Payer: Self-pay | Admitting: Oncology

## 2012-10-25 ENCOUNTER — Other Ambulatory Visit: Payer: Self-pay | Admitting: Lab

## 2012-10-25 ENCOUNTER — Ambulatory Visit
Admission: RE | Admit: 2012-10-25 | Discharge: 2012-10-25 | Disposition: A | Discharge status: Home / Self Care | Payer: Medicare Other | Source: Ambulatory Visit | Attending: Radiation Oncology | Admitting: Radiation Oncology

## 2012-10-25 ENCOUNTER — Ambulatory Visit: Payer: Medicare Other

## 2012-10-25 ENCOUNTER — Encounter: Payer: Medicare Other | Admitting: Nutrition

## 2012-10-25 ENCOUNTER — Encounter: Payer: Self-pay | Admitting: Oncology

## 2012-10-25 ENCOUNTER — Ambulatory Visit (HOSPITAL_BASED_OUTPATIENT_CLINIC_OR_DEPARTMENT_OTHER): Payer: Medicare Other | Admitting: Oncology

## 2012-10-25 ENCOUNTER — Ambulatory Visit: Payer: Medicare Other | Admitting: Lab

## 2012-10-25 ENCOUNTER — Other Ambulatory Visit (HOSPITAL_BASED_OUTPATIENT_CLINIC_OR_DEPARTMENT_OTHER): Payer: Medicare Other | Admitting: Lab

## 2012-10-25 VITALS — BP 92/63 | HR 90 | Temp 98.2°F | Resp 18 | Ht 68.0 in | Wt 120.7 lb

## 2012-10-25 DIAGNOSIS — R634 Abnormal weight loss: Secondary | ICD-10-CM

## 2012-10-25 DIAGNOSIS — C321 Malignant neoplasm of supraglottis: Secondary | ICD-10-CM

## 2012-10-25 DIAGNOSIS — K117 Disturbances of salivary secretion: Secondary | ICD-10-CM

## 2012-10-25 DIAGNOSIS — N289 Disorder of kidney and ureter, unspecified: Secondary | ICD-10-CM

## 2012-10-25 LAB — BASIC METABOLIC PANEL (CC13)
BUN: 12 mg/dL (ref 7.0–26.0)
CO2: 26 mEq/L (ref 22–29)
Calcium: 9 mg/dL (ref 8.4–10.4)
Creatinine: 1.4 mg/dL — ABNORMAL HIGH (ref 0.7–1.3)
Glucose: 104 mg/dl — ABNORMAL HIGH (ref 70–99)

## 2012-10-25 LAB — CBC WITH DIFFERENTIAL/PLATELET
Basophils Absolute: 0 10*3/uL (ref 0.0–0.1)
Eosinophils Absolute: 0 10*3/uL (ref 0.0–0.5)
HGB: 11.2 g/dL — ABNORMAL LOW (ref 13.0–17.1)
LYMPH%: 11.3 % — ABNORMAL LOW (ref 14.0–49.0)
MCV: 91.4 fL (ref 79.3–98.0)
MONO%: 14.7 % — ABNORMAL HIGH (ref 0.0–14.0)
NEUT#: 1.3 10*3/uL — ABNORMAL LOW (ref 1.5–6.5)
NEUT%: 71.7 % (ref 39.0–75.0)
Platelets: 132 10*3/uL — ABNORMAL LOW (ref 140–400)

## 2012-10-25 NOTE — Progress Notes (Signed)
[facility]  Telephone:([phone] Fax:([phone]   OFFICE PROGRESS NOTE   Cc:  Gwynneth Aliment, MD  DIAGNOSIS:  Newly diagnosed stage IVA laryngeal cancer.   PAST THERAPY: total laryngectomy and right neck dissection on 08/13/2012.    CURRENT THERAPY:  Started adjuvant chemoradiation on 09/20/12. Receiving chemotherapy with weekly Cisplatin.   INTERVAL HISTORY: Cody [name] 67 y.o. male returns for regular follow up with his daughter.  He reports anorexia.  Nothing tastes good.  He is trying to push himself.  He has not used his PEG tube yet. His weight is stable. Daughter thinks he is not drinking fluids as well as he was.  He has mild mucositis.  He is performing swallowing exercise on routinely.  He has thick phlegm. He is using salt/baking soda alternating with magic mouth wash.  He has mild fatigue; however, he is still working with PT.  He has had some nausea and vomited on one occasion.   Patient denies fever, headache, visual changes, confusion, drenching night sweats, palpable lymph node swelling, mucositis, odynophagia, dysphagia, nausea vomiting, jaundice, chest pain, palpitation, shortness of breath, dyspnea on exertion, productive cough, gum bleeding, epistaxis, hematemesis, hemoptysis, abdominal pain, abdominal swelling, early satiety, melena, hematochezia, hematuria, skin rash, spontaneous bleeding, joint swelling, joint pain, heat or cold intolerance, bowel bladder incontinence, back pain, focal motor weakness, paresthesia.    Past Medical History  Diagnosis Date  . TB (pulmonary tuberculosis) 1998  . Learning disabilities   . Cough   . Difficulty swallowing   . Pain on swallowing   . Abnormal weight loss documented 07/07/12     20 lbs  . Referred otalgia     Right Ear  . 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 Arthritis   . 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 Colonoscopy  2006    neg.   . Direct laryngoscopy  07/09/11    Epiglottic mass  . Direct laryngoscopy  07/08/2012    Procedure: DIRECT LARYNGOSCOPY;  Surgeon: Serena Colonel, MD;  Location: [facility] OR;  Service: ENT;  Laterality: N/A;  . Esophagoscopy  07/08/2012    Procedure: ESOPHAGOSCOPY;  Surgeon: Serena Colonel, MD;  Location: [facility] OR;  Service: ENT;  Laterality: N/A;  . Radical neck dissection Bilateral 08/13/2012    Procedure: RADICAL NECK DISSECTION;  Surgeon: Serena Colonel, MD;  Location: [facility] OR;  Service: ENT;  Laterality: Bilateral;  . Tracheostomy tube placement N/A 08/13/2012    Procedure: TRACHEOSTOMY;  Surgeon: Serena Colonel, MD;  Location: [facility] OR;  Service: ENT;  Laterality: N/A;  . Pectoralis flap N/A 08/13/2012    Procedure: RIGHT PECTORALIS MYOCUTANEOUS FLAP to LARYNX;  Surgeon: Etter Sjogren, MD;  Location: [facility] OR;  Service: Plastics;  Laterality: N/A;  . Multiple extractions with alveoloplasty N/A 08/19/2012    Procedure: Extraction of tooth #'s 1,2,3,4,5,6,10,11,12,13,14,16,17,18,19,21,22,23, 24,25,26,27,28,29,30,31,32 with alveoloplasty and bilateral mandibular buccal exostoses reductions;  Surgeon: Charlynne Pander, DDS;  Location: [facility] OR;  Service: Oral Surgery;  Laterality: N/A;  . Laryngoscopy N/A 08/19/2012    Procedure: LARYNGOSCOPY;  Surgeon: Serena Colonel, MD;  Location: [facility] OR;  Service: ENT;  Laterality: N/A;  Anesthesia's Laryngoscope    Current Outpatient Prescriptions  Medication Sig Dispense Refill  . Alum & Mag Hydroxide-Simeth (MAGIC MOUTHWASH W/LIDOCAINE) SOLN 1part nystatin,1part Maaloxplus,1part benadryl,3part 2%viscous lidocaine. Swish/swallow  10 mL up to QID, before meals/bedtime  480 mL  5  . HYDROcodone-acetaminophen (HYCET) 7.5-325 mg/15 ml solution Take 15 mLs by mouth 4 (four) times daily as needed for pain.  480 mL  0  . LORazepam (ATIVAN) 0.5 MG tablet Take 0.5 mg by mouth every 6 (six) hours as needed (nausea or  vomiting).      . ondansetron (ZOFRAN) 8 MG tablet Take 8 mg by mouth every 12 (twelve) hours as needed for nausea.      . polyethylene glycol (MIRALAX) packet Take 17 g by mouth every other day as needed (severe constipation.).  14 each  5  . prochlorperazine (COMPAZINE) 10 MG tablet Take 10 mg by mouth every 6 (six) hours as needed (nausea).      . sucralfate (CARAFATE) 1 G tablet crush 1 tablet in 10 mL H20 and swallow up to QID prn sore throat  30 tablet  5   No current facility-administered medications for this visit.    ALLERGIES:  has No Known Allergies.  REVIEW OF SYSTEMS:  The rest of the 14-point review of system was negative.   Filed Vitals:   10/25/12 0935  BP: 92/63  Pulse: 90  Temp: 98.2 F (36.8 C)  Resp: 18   Wt Readings from Last 3 Encounters:  10/25/12 120 lb 11.2 oz (54.749 kg)  10/19/12 124 lb (56.246 kg)  10/15/12 120 lb 9.6 oz (54.704 kg)   ECOG Performance status: 1-2  PHYSICAL EXAMINATION:   General:  Thin-appearing man,  in no acute distress.  Eyes:  no scleral icterus.  ENT:  There were no oropharyngeal lesions.  Neck was without thyromegaly.  Right neck dissection scar and trach stoma were well healed without erythema, purulent discharge, or foul odor.  Lymphatics:  Negative cervical, supraclavicular or axillary adenopathy.  Respiratory: lungs were clear bilaterally without wheezing or crackles.  Cardiovascular:  Regular rate and rhythm, S1/S2, without murmur, rub or gallop.  There was no pedal edema.  GI:  abdomen was soft, flat, nontender, nondistended, without organomegaly.  PEG tube was in place without any erythema, discharge, or pain on palpation. Muscoloskeletal:  no spinal tenderness of palpation of vertebral spine.  Skin exam was without echymosis, petichae.  Neuro exam was nonfocal.  Patient was able to get on and off exam table without assistance.  Gait was slow and measured.  Patient was alerted and oriented.  Attention was good.   Language was  appropriate.  Mood was normal without depression.  Speech was not pressured.  Thought content was not tangential.     LABORATORY/RADIOLOGY DATA:  Lab Results  Component Value Date   WBC 1.8* 10/25/2012   HGB 11.2* 10/25/2012   HCT 33.1* 10/25/2012   PLT 132* 10/25/2012   GLUCOSE 91 10/15/2012   ALKPHOS 82 09/20/2012   ALT 8 09/20/2012   AST 18 09/20/2012   NA 132* 10/15/2012   K 4.2 10/15/2012   CL 98 10/15/2012   CREATININE 1.2 10/15/2012   BUN 16.1 10/15/2012   CO2 26 10/15/2012   INR 1.08 09/14/2012     ASSESSMENT AND PLAN:   1. Diagnosis: Head and neck cancer. 2. Treatment: Weekly cisplatin chemotherapy and daily radiation 3. Status: He is having grade 1-2 anorexia/weight loss, grade 1 mucositis, grade 1-2 xerostomia.  There are expected side effects. He has developed grade 2 neutropenia. Will hold chemo today. He is due to complete XRT later this week and therefore he is now done with  chemo. 4. Xerostomia:  I advised him to add diluted H2O2 (1:4) to thin out his thick oral secretion in addition to magic mouth wash and salt/baking soda  5. To prevent esophageal stricture: I again advised him to continue swallowing exercise as much as possible.  6. Anorexia/weight loss:  I advised him to supplement his diet with Ensure. 7. Renal insufficiency:  Grade 1 due to chemo.  Cr is pending today.  I advised him to take at least 60-oz of fluid a day. May consider IVF if Cr is 1.5 or higher today. 8. Followup: In 3 weeks.     The length of time of the face-to-face encounter was 15 minutes. More than 50% of time was spent counseling and coordination of care.

## 2012-10-26 ENCOUNTER — Ambulatory Visit
Admission: RE | Admit: 2012-10-26 | Discharge: 2012-10-26 | Disposition: A | Discharge status: Home / Self Care | Payer: Medicare Other | Source: Ambulatory Visit | Attending: Radiation Oncology | Admitting: Radiation Oncology

## 2012-10-27 ENCOUNTER — Ambulatory Visit
Admission: RE | Admit: 2012-10-27 | Discharge: 2012-10-27 | Disposition: A | Discharge status: Home / Self Care | Payer: Medicare Other | Source: Ambulatory Visit | Attending: Radiation Oncology | Admitting: Radiation Oncology

## 2012-10-27 ENCOUNTER — Encounter: Payer: Self-pay | Admitting: Radiation Oncology

## 2012-10-27 VITALS — BP 114/69 | HR 73 | Resp 18 | Wt 121.7 lb

## 2012-10-27 DIAGNOSIS — C321 Malignant neoplasm of supraglottis: Secondary | ICD-10-CM

## 2012-10-27 NOTE — Progress Notes (Signed)
   Weekly Management Note:  outpatient Current Dose:  56 Gy  Projected Dose: 60 Gy   Narrative:  The patient presents for routine under treatment assessment.  CBCT/MVCT images/Port film x-rays were reviewed.  The chart was checked. He is doing about the same.  Eating grits and eggs, but resistant to more than 2 cans per peg /24 hrs.  Left shoulder pain increased -- this was notable postoperatively as well. Taking Hycet PRN. Weight up by 1 lb. Esophagitis tolerable, stable.  Physical Findings:  weight is 121 lb 11.2 oz (55.203 kg). His blood pressure is 114/69 and his pulse is 73. His respiration is 18.  NAD, Stoma with mild crusting. No active bleeding. No oral thrush or oral mucositis. Hyperpigmentation of neck skin.  Impression:  The patient is tolerating radiotherapy.  Plan:  Continue radiotherapy as planned. Suggested smaller PEG feeds, but more often.  F/u in 21mo, sooner if needed.  ________________________________   Lonie Peak, M.D.

## 2012-10-27 NOTE — Progress Notes (Signed)
Patient presents to the clinic today accompanied by his daughter and grandson for PUT with Dr. Basilio Cairo. Patient alert and oriented to person, place, and time. No distress noted. Steady gait noted. Pleasant affect noted. Patient reports left shoulder pain x2 days for which he took hycet last night. Patient completes radiation treatment to bilateral neck in a few days therefore, a follow up appointment card was given for one month. Hyperpigmentation with mild irritation around anterior neck stoma site. Daughter reports cleansing the area daily and applying neosporin around the outter edge. Patient has gained one pound since 10/25/2012. Daughter reports that the patient continues to eat his breakfast by mouth which usually consist of grit so long as she gives him an antiemetic prior. She goes on to explain he takes in only 1-2 cans of supplement now via feeding tube down from 3-4. She reports he doesn't drink as much therefore this writer encouraged her to hydrate with warm water slowly via feeding tube and she verbalized understanding. Reported all findings to Dr. Basilio Cairo.

## 2012-10-28 ENCOUNTER — Ambulatory Visit
Admission: RE | Admit: 2012-10-28 | Discharge: 2012-10-28 | Disposition: A | Discharge status: Home / Self Care | Payer: Medicare Other | Source: Ambulatory Visit | Attending: Radiation Oncology | Admitting: Radiation Oncology

## 2012-10-29 ENCOUNTER — Encounter: Payer: Self-pay | Admitting: Radiation Oncology

## 2012-10-29 ENCOUNTER — Ambulatory Visit
Admission: RE | Admit: 2012-10-29 | Discharge: 2012-10-29 | Disposition: A | Discharge status: Home / Self Care | Payer: Medicare Other | Source: Ambulatory Visit | Attending: Radiation Oncology | Admitting: Radiation Oncology

## 2012-11-01 NOTE — Progress Notes (Signed)
  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   He received weekly Cisplatin. Developed moderate esophagitis. Ate soft foods and supplemented with PEG.   Plan: The patient has completed radiation treatment. The patient will return to radiation oncology clinic for routine followup in 3 weeks. I advised them to call or return sooner if they have any questions or concerns related to their recovery or treatment.  -----------------------------------  Lonie Peak, MD

## 2012-11-15 ENCOUNTER — Other Ambulatory Visit (HOSPITAL_BASED_OUTPATIENT_CLINIC_OR_DEPARTMENT_OTHER): Payer: Medicare Other | Admitting: Lab

## 2012-11-15 ENCOUNTER — Encounter: Payer: Self-pay | Admitting: Nutrition

## 2012-11-15 ENCOUNTER — Telehealth: Payer: Self-pay | Admitting: Oncology

## 2012-11-15 ENCOUNTER — Ambulatory Visit (HOSPITAL_BASED_OUTPATIENT_CLINIC_OR_DEPARTMENT_OTHER): Payer: Medicare Other | Admitting: Oncology

## 2012-11-15 ENCOUNTER — Encounter: Payer: Self-pay | Admitting: Oncology

## 2012-11-15 VITALS — BP 99/66 | HR 88 | Temp 97.7°F | Resp 18 | Ht 68.0 in | Wt 117.2 lb

## 2012-11-15 DIAGNOSIS — R5383 Other fatigue: Secondary | ICD-10-CM

## 2012-11-15 DIAGNOSIS — C321 Malignant neoplasm of supraglottis: Secondary | ICD-10-CM

## 2012-11-15 DIAGNOSIS — R5381 Other malaise: Secondary | ICD-10-CM

## 2012-11-15 LAB — CBC WITH DIFFERENTIAL/PLATELET
BASO%: 0.5 % (ref 0.0–2.0)
EOS%: 0.8 % (ref 0.0–7.0)
MCH: 32.1 pg (ref 27.2–33.4)
MCHC: 34.8 g/dL (ref 32.0–36.0)
NEUT%: 50.9 % (ref 39.0–75.0)
RBC: 3.72 10*6/uL — ABNORMAL LOW (ref 4.20–5.82)
RDW: 19 % — ABNORMAL HIGH (ref 11.0–14.6)
lymph#: 1.1 10*3/uL (ref 0.9–3.3)

## 2012-11-15 LAB — COMPREHENSIVE METABOLIC PANEL (CC13)
ALT: 9 U/L (ref 0–55)
AST: 17 U/L (ref 5–34)
Albumin: 3.2 g/dL — ABNORMAL LOW (ref 3.5–5.0)
Alkaline Phosphatase: 77 U/L (ref 40–150)
BUN: 11.1 mg/dL (ref 7.0–26.0)
CO2: 26 meq/L (ref 22–29)
Calcium: 9.4 mg/dL (ref 8.4–10.4)
Chloride: 98 meq/L (ref 98–107)
Creatinine: 1.4 mg/dL — ABNORMAL HIGH (ref 0.7–1.3)
Glucose: 90 mg/dL (ref 70–99)
Potassium: 4.3 meq/L (ref 3.5–5.1)
Sodium: 134 meq/L — ABNORMAL LOW (ref 136–145)
Total Bilirubin: 0.43 mg/dL (ref 0.20–1.20)
Total Protein: 8.4 g/dL — ABNORMAL HIGH (ref 6.4–8.3)

## 2012-11-15 NOTE — Telephone Encounter (Signed)
gv and printed appt sched and avs for pt....pt sched for G. tub removal on 6.16.14 @ 8:00am...pt aware cs will contact with d/t for PET

## 2012-11-15 NOTE — Progress Notes (Signed)
I provided patient with his third and final case of Ensure Plus.

## 2012-11-15 NOTE — Progress Notes (Signed)
[facility]  Telephone:(336) [phone] Fax:(336) [phone]   OFFICE PROGRESS NOTE   Cc:  Gwynneth Aliment, MD  DIAGNOSIS:  Newly diagnosed stage IVA laryngeal cancer.   PAST THERAPY: total laryngectomy and right neck dissection on 08/13/2012.  Received adjuvant chemoradiation 09/20/12 through 10/29/2012. Received chemotherapy with weekly Cisplatin.   CURRENT THERAPY:  Watchful observation.   INTERVAL HISTORY: Cody [name] 67 y.o. male returns for regular follow up with his daughter.  He is starting to feel better since completing therapy. Nothing tastes good, but he is continuing to eat.Marland Kitchen  He has not used his PEG tube yet. His weight is stable. He would like his PEG tube removed. He has mild mucositis.  He is performing swallowing exercise on routinely.  He has thick phlegm. He is using salt/baking soda alternating with magic mouth wash.  He has mild fatigue; however, he is still working with PT.  He has had some nausea without vomiting.   Patient denies fever, headache, visual changes, confusion, drenching night sweats, palpable lymph node swelling, mucositis, odynophagia, dysphagia, nausea vomiting, jaundice, chest pain, palpitation, shortness of breath, dyspnea on exertion, productive cough, gum bleeding, epistaxis, hematemesis, hemoptysis, abdominal pain, abdominal swelling, early satiety, melena, hematochezia, hematuria, skin rash, spontaneous bleeding, joint swelling, joint pain, heat or cold intolerance, bowel bladder incontinence, back pain, focal motor weakness, paresthesia.    Past Medical History  Diagnosis Date  . TB (pulmonary tuberculosis) 1998  . Learning disabilities   . Cough   . Difficulty swallowing   . Pain on swallowing   . Abnormal weight loss documented 07/07/12     20 lbs  . Referred otalgia     Right Ear  . 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 Arthritis   . Cancer     laryngeal =invasive sq cell mets  10/25 lymph nodes    Past Surgical History  Procedure Laterality Date  . Colonoscopy  2006    neg.   . Direct laryngoscopy  07/09/11    Epiglottic mass  . Direct laryngoscopy  07/08/2012    Procedure: DIRECT LARYNGOSCOPY;  Surgeon: Serena Colonel, MD;  Location: [facility] OR;  Service: ENT;  Laterality: N/A;  . Esophagoscopy  07/08/2012    Procedure: ESOPHAGOSCOPY;  Surgeon: Serena Colonel, MD;  Location: [facility] OR;  Service: ENT;  Laterality: N/A;  . Radical neck dissection Bilateral 08/13/2012    Procedure: RADICAL NECK DISSECTION;  Surgeon: Serena Colonel, MD;  Location: [facility] OR;  Service: ENT;  Laterality: Bilateral;  . Tracheostomy tube placement N/A 08/13/2012    Procedure: TRACHEOSTOMY;  Surgeon: Serena Colonel, MD;  Location: [facility] OR;  Service: ENT;  Laterality: N/A;  . Pectoralis flap N/A 08/13/2012    Procedure: RIGHT PECTORALIS MYOCUTANEOUS FLAP to LARYNX;  Surgeon: Etter Sjogren, MD;  Location: [facility] OR;  Service: Plastics;  Laterality: N/A;  . Multiple extractions with alveoloplasty N/A 08/19/2012    Procedure: Extraction of tooth #'s 1,2,3,4,5,6,10,11,12,13,14,16,17,18,19,21,22,23, 24,25,26,27,28,29,30,31,32 with alveoloplasty and bilateral mandibular buccal exostoses reductions;  Surgeon: Charlynne Pander, DDS;  Location: [facility] OR;  Service: Oral Surgery;  Laterality: N/A;  . Laryngoscopy N/A 08/19/2012    Procedure: LARYNGOSCOPY;  Surgeon: Serena Colonel, MD;  Location: [facility] OR;  Service: ENT;  Laterality: N/A;  Anesthesia's Laryngoscope    Current Outpatient Prescriptions  Medication Sig Dispense Refill  . Alum & Mag Hydroxide-Simeth (MAGIC MOUTHWASH W/LIDOCAINE) SOLN 1part nystatin,1part Maaloxplus,1part benadryl,3part 2%viscous lidocaine. Swish/swallow 10 mL  up to QID, before meals/bedtime  480 mL  5  . HYDROcodone-acetaminophen (HYCET) 7.5-325 mg/15 ml solution Take 15 mLs by mouth 4 (four) times daily as needed for pain.  480 mL  0  . LORazepam (ATIVAN) 0.5 MG tablet Take 0.5 mg by mouth every 6 (six) hours  as needed (nausea or vomiting).      . ondansetron (ZOFRAN) 8 MG tablet Take 8 mg by mouth every 12 (twelve) hours as needed for nausea.      . polyethylene glycol (MIRALAX) packet Take 17 g by mouth every other day as needed (severe constipation.).  14 each  5  . prochlorperazine (COMPAZINE) 10 MG tablet Take 10 mg by mouth every 6 (six) hours as needed (nausea).      . sucralfate (CARAFATE) 1 G tablet crush 1 tablet in 10 mL H20 and swallow up to QID prn sore throat  30 tablet  5   No current facility-administered medications for this visit.    ALLERGIES:  has No Known Allergies.  REVIEW OF SYSTEMS:  The rest of the 14-point review of system was negative.   Filed Vitals:   11/15/12 1346  BP: 99/66  Pulse: 88  Temp: 97.7 F (36.5 C)  Resp: 18   Wt Readings from Last 3 Encounters:  11/15/12 117 lb 3.2 oz (53.162 kg)  10/27/12 121 lb 11.2 oz (55.203 kg)  10/25/12 120 lb 11.2 oz (54.749 kg)   ECOG Performance status: 1-2  PHYSICAL EXAMINATION:   General:  Thin-appearing man,  in no acute distress.  Eyes:  no scleral icterus.  ENT:  There were no oropharyngeal lesions.  Neck was without thyromegaly.  Right neck dissection scar and trach stoma were well healed without erythema, purulent discharge, or foul odor.  Lymphatics:  Negative cervical, supraclavicular or axillary adenopathy.  Respiratory: lungs were clear bilaterally without wheezing or crackles.  Cardiovascular:  Regular rate and rhythm, S1/S2, without murmur, rub or gallop.  There was no pedal edema.  GI:  abdomen was soft, flat, nontender, nondistended, without organomegaly.  PEG tube was in place without any erythema, discharge, or pain on palpation. Muscoloskeletal:  no spinal tenderness of palpation of vertebral spine.  Skin exam was without echymosis, petichae.  Neuro exam was nonfocal.  Patient was able to get on and off exam table without assistance.  Gait was slow and measured.  Patient was alerted and oriented.  Attention  was good.   Language was appropriate.  Mood was normal without depression.  Speech was not pressured.  Thought content was not tangential.     LABORATORY/RADIOLOGY DATA:  Lab Results  Component Value Date   WBC 3.3* 11/15/2012   HGB 11.9* 11/15/2012   HCT 34.3* 11/15/2012   PLT 259 11/15/2012   GLUCOSE 90 11/15/2012   ALKPHOS 77 11/15/2012   ALT 9 11/15/2012   AST 17 11/15/2012   NA 134* 11/15/2012   K 4.3 11/15/2012   CL 98 11/15/2012   CREATININE 1.4* 11/15/2012   BUN 11.1 11/15/2012   CO2 26 11/15/2012   INR 1.08 09/14/2012     ASSESSMENT AND PLAN:   1. Diagnosis: Head and neck cancer. 2. Treatment: Weekly cisplatin chemotherapy and daily radiation 3. Status: He is developed grade 1-2 anorexia/weight loss, grade 1 mucositis, grade 1-2 xerostomia, grade 2 neutropenia while on treatment. He is slowly recovering at this time. He will have a restaging PET scan in mid September. 4. Xerostomia:  I advised him to add diluted  H2O2 (1:4) to thin out his thick oral secretion in addition to magic mouth wash and salt/baking soda  5. To prevent esophageal stricture: I again advised him to continue swallowing exercise as much as possible.  6. Anorexia/weight loss:  Weight remains stable. I have encouraged him to continue to drink ensure to 3 times a day. He would like his PEG tube out and I have referred him back to interventional radiology for removal. 7. Renal insufficiency:  Grade 1 due to chemo.  Creatinine is stable.  I advised him to take at least 60-oz of fluid a day.  8. Followup: In about 1 month.     The length of time of the face-to-face encounter was 15 minutes. More than 50% of time was spent counseling and coordination of care.

## 2012-11-18 ENCOUNTER — Encounter: Payer: Self-pay | Admitting: Radiation Oncology

## 2012-11-19 ENCOUNTER — Encounter: Payer: Self-pay | Admitting: Radiation Oncology

## 2012-11-19 ENCOUNTER — Ambulatory Visit
Admission: RE | Admit: 2012-11-19 | Discharge: 2012-11-19 | Disposition: A | Discharge status: Home / Self Care | Payer: Medicare Other | Source: Ambulatory Visit | Attending: Radiation Oncology | Admitting: Radiation Oncology

## 2012-11-19 VITALS — BP 102/68 | HR 75 | Temp 97.9°F | Ht 68.0 in | Wt 119.2 lb

## 2012-11-19 DIAGNOSIS — Z9221 Personal history of antineoplastic chemotherapy: Secondary | ICD-10-CM | POA: Insufficient documentation

## 2012-11-19 DIAGNOSIS — Z923 Personal history of irradiation: Secondary | ICD-10-CM | POA: Insufficient documentation

## 2012-11-19 DIAGNOSIS — C321 Malignant neoplasm of supraglottis: Secondary | ICD-10-CM | POA: Insufficient documentation

## 2012-11-19 DIAGNOSIS — R220 Localized swelling, mass and lump, head: Secondary | ICD-10-CM | POA: Insufficient documentation

## 2012-11-19 DIAGNOSIS — K08109 Complete loss of teeth, unspecified cause, unspecified class: Secondary | ICD-10-CM | POA: Insufficient documentation

## 2012-11-19 DIAGNOSIS — Z931 Gastrostomy status: Secondary | ICD-10-CM | POA: Insufficient documentation

## 2012-11-19 HISTORY — DX: Personal history of irradiation: Z92.3

## 2012-11-19 MED ORDER — RADIAPLEXRX EX GEL
Freq: Once | CUTANEOUS | Status: AC
  Administered 2012-11-19: 17:00:00 via TOPICAL

## 2012-11-19 NOTE — Progress Notes (Signed)
Mr. Eichorn here for fu s/p radiation to the supraglottic cancer which completed on 10/29/12.  Mr. Fudala denies any pain nor dry mouth.  He has gain 3 lbs since 11/15/12.  He is currently eating and not using his peg tube.  He is scheduled to have his peg tube removed next week.  He is drinking 2 cans of ensure daily and eating soft foods such as grits, scrambled eggs and sausage and bacon blended into crumbs. All meats in food processor.  His oral mucosa is moist and intact.  His stoma is slowly healing and his skin in the treatment field intact with hyperpigmentation.

## 2012-11-19 NOTE — Progress Notes (Signed)
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   He has gained 3 lbs since 11/15/12. He is currently eating and not using his PEG tube. He is scheduled to have his tube removed next week. He is drinking 2 cans of ensure daily and eating soft foods such as grits, scrambled eggs and sausage and bacon blended into crumbs. All meats in food processor. Uses hydrocodone elixir intermittently, he is not on a pain patch. His daughter provides a history today, feels he is doing better. He is using radiaplex over his skin.   ALLERGIES:  has No Known Allergies.  Meds: Current Outpatient Prescriptions  Medication Sig Dispense Refill  . Alum & Mag Hydroxide-Simeth (MAGIC MOUTHWASH W/LIDOCAINE) SOLN 1part nystatin,1part Maaloxplus,1part benadryl,3part 2%viscous lidocaine. Swish/swallow 10 mL up to QID, before meals/bedtime  480 mL  5  . HYDROcodone-acetaminophen (HYCET) 7.5-325 mg/15 ml solution Take 15 mLs by mouth 4 (four) times daily as needed for pain.  480 mL  0  . LORazepam (ATIVAN) 0.5 MG tablet Take 0.5 mg by mouth every 6 (six) hours as needed (nausea or vomiting).      . ondansetron (ZOFRAN) 8 MG tablet Take 8 mg by mouth every 12 (twelve) hours as needed for nausea.      . polyethylene glycol (MIRALAX) packet Take 17 g by mouth every other day as needed (severe constipation.).  14 each  5  . prochlorperazine (COMPAZINE) 10 MG tablet Take 10 mg by mouth every 6 (six) hours as needed (nausea).      . sucralfate  (CARAFATE) 1 G tablet crush 1 tablet in 10 mL H20 and swallow up to QID prn sore throat  30 tablet  5   No current facility-administered medications for this encounter.    Physical Findings: The patient is in no acute distress. Patient is alert and oriented.  height is 5\' 8"  (1.727 m) and weight is 119 lb 3.2 oz (54.069 kg). His temperature is 97.9 F (36.6 C). His blood pressure is 102/68 and his pulse is 75. His oxygen saturation is 100%. .  Stoma has no active bleeding or signs of infection or recurrent disease. Skin is healing well over his neck. Continues to have a postoperative round swollen region in the low right neck which is stable. Oropharynx shows no thrush or active mucositis   Lab Findings: Lab Results  Component Value Date   WBC 3.3* 11/15/2012   HGB 11.9* 11/15/2012   HCT 34.3* 11/15/2012   MCV 92.3 11/15/2012   PLT 259 11/15/2012    Radiographic Findings: No results found.  Impression/Plan:    1) Head and Neck Cancer Status: Healing well from radiotherapy and chemotherapy  2) Nutritional Status: - weight: Increasing with by mouth intake alone - PEG tube: Medical oncology has scheduled for this to be removed  3) Risk Factors: The patient has been educated about risk factors including alcohol and tobacco abuse; they understand that avoidance of alcohol and tobacco is important to prevent recurrences as well as other cancers  4) Swallowing: Good  5) Dental: He is edentulous   6) Energy: Reasonable wall healing from treatment  7) Social: No active social issues to address at this time  8) Follow-up in 3 months after PET scan. The patient was encouraged to call with any issues or questions before then.  I spent 20 minutes minutes face to face with the patient and more than 50% of that time was spent in counseling and/or coordination of care. _____________________________________   Lonie Peak, MD

## 2012-11-22 ENCOUNTER — Ambulatory Visit (HOSPITAL_COMMUNITY)
Admission: RE | Admit: 2012-11-22 | Discharge: 2012-11-22 | Disposition: A | Discharge status: Home / Self Care | Payer: Medicare Other | Source: Ambulatory Visit | Attending: Oncology | Admitting: Oncology

## 2012-11-22 ENCOUNTER — Other Ambulatory Visit: Payer: Self-pay | Admitting: Oncology

## 2012-11-22 DIAGNOSIS — C329 Malignant neoplasm of larynx, unspecified: Secondary | ICD-10-CM | POA: Insufficient documentation

## 2012-11-22 DIAGNOSIS — R5383 Other fatigue: Secondary | ICD-10-CM

## 2012-11-22 DIAGNOSIS — C321 Malignant neoplasm of supraglottis: Secondary | ICD-10-CM

## 2012-11-22 DIAGNOSIS — Z431 Encounter for attention to gastrostomy: Secondary | ICD-10-CM | POA: Insufficient documentation

## 2012-11-22 MED ORDER — LIDOCAINE VISCOUS 2 % MT SOLN
15.0000 mL | Freq: Once | OROMUCOSAL | Status: AC
  Administered 2012-11-22: 15 mL via OROMUCOSAL
  Filled 2012-11-22: qty 15

## 2012-11-22 NOTE — Procedures (Signed)
Successful removal of gastrostomy tube. No complications  Brayton El PA-C Interventional Radiology 11/22/2012 9:20 AM

## 2012-12-13 DIAGNOSIS — Z8521 Personal history of malignant neoplasm of larynx: Secondary | ICD-10-CM

## 2012-12-13 DIAGNOSIS — Z09 Encounter for follow-up examination after completed treatment for conditions other than malignant neoplasm: Secondary | ICD-10-CM

## 2012-12-14 ENCOUNTER — Ambulatory Visit (HOSPITAL_COMMUNITY): Payer: Self-pay | Admitting: Dentistry

## 2012-12-14 ENCOUNTER — Encounter (HOSPITAL_COMMUNITY): Payer: Self-pay | Admitting: Dentistry

## 2012-12-14 VITALS — BP 100/67 | HR 77 | Temp 98.2°F

## 2012-12-14 DIAGNOSIS — M270 Developmental disorders of jaws: Secondary | ICD-10-CM

## 2012-12-14 DIAGNOSIS — Z0189 Encounter for other specified special examinations: Secondary | ICD-10-CM

## 2012-12-14 DIAGNOSIS — C321 Malignant neoplasm of supraglottis: Secondary | ICD-10-CM

## 2012-12-14 NOTE — Patient Instructions (Addendum)
Return to clinic for start of upper lower complete denture fabrication in August of 2014. Patient is to check on Medicaid eligibility with social services. Patient called problems arise before then.  RECOMMENDATIONS: 1. Brush tongue daily. 2. Use trismus exercises as directed. 3. Use Biotene Rinse or salt water/baking soda rinses. 4. Multiple sips of water as needed. 5. Return to clinic in two months for start of upper lower complete denture fabrication. Quote provided. Patient was encouraged to check with Medicaid concerning eligibility for Medicaid coverage. Call if problems before then.  Charlynne Pander, DDS

## 2012-12-14 NOTE — Progress Notes (Signed)
12/14/2012  Patient:            Cody [name] Date of Birth:  February 04, 1946 MRN:                [id number]  BP 100/67  Pulse 77  Temp(Src) 98.2 F (36.8 C)   [name] presents for periodic oral examination after radiation therapy. Patient with history of squamous cell carcinoma of the right supraglottic larynx. The patient underwent total laryngectomy and bilateral modified neck dissection with right pectoralis flap to the larynx on 08/13/2012. Patient then underwent extraction of remaining teeth with alveoloplasty and pre-prosthetic surgery as indicated in the operating room on 08/19/2012. Patient then underwent postoperative radiation therapy with Dr. Basilio Cairo from 09/20/2012 through 10/29/2012. Patient now presents for periodic oral examination after radiation therapy.  REVIEW OF CHIEF COMPLAINTS:  DRY MOUTH: Yes HARD TO SWALLOW: No  HURT TO SWALLOW: No TASTE CHANGES: Taste is coming back SORES IN MOUTH: None TRISMUS: Patient denies any trismus symptoms WEIGHT: 117 lbs.  HOME OH REGIMEN:  BRUSHING: Brushing his tongue daily. RINSING: Using salt water and baking soda rinses as well as Biotene rinses. TRISMUS EXERCISES:  Maximum interincisal opening: 55+ millimeters   DENTAL EXAM:  Oral Hygiene:(PLAQUE): Patient is edentulous. LOCATION OF MUCOSITIS: None noted DESCRIPTION OF SALIVA: Decreased with foamy saliva. Mild xerostomia. ANY EXPOSED BONE: None noted OTHER WATCHED AREAS: Palatal torus Diagnoses:   1. xerostomia 2. Dysgeusia-resolving 3. Edentulous 4. Small, mid-Palatal torus   RECOMMENDATIONS: 1. Brush tongue daily. 2. Use trismus exercises as directed. 3. Use Biotene Rinse or salt water/baking soda rinses. 4. Multiple sips of water as needed. 5. Return to clinic in two months for start of upper lower complete denture fabrication. Quote provided. Patient was encouraged to check with Medicaid concerning eligibility for Medicaid coverage. Call if problems before  then.  Charlynne Pander 12/14/2012

## 2012-12-15 ENCOUNTER — Other Ambulatory Visit (HOSPITAL_COMMUNITY): Payer: Self-pay | Admitting: Dentistry

## 2012-12-15 ENCOUNTER — Other Ambulatory Visit (HOSPITAL_BASED_OUTPATIENT_CLINIC_OR_DEPARTMENT_OTHER): Payer: Medicare Other | Admitting: Lab

## 2012-12-15 ENCOUNTER — Ambulatory Visit (HOSPITAL_BASED_OUTPATIENT_CLINIC_OR_DEPARTMENT_OTHER): Payer: Medicare Other | Admitting: Oncology

## 2012-12-15 VITALS — BP 108/70 | HR 76 | Temp 97.2°F | Resp 18 | Ht 68.0 in | Wt 121.8 lb

## 2012-12-15 DIAGNOSIS — C321 Malignant neoplasm of supraglottis: Secondary | ICD-10-CM

## 2012-12-15 DIAGNOSIS — E039 Hypothyroidism, unspecified: Secondary | ICD-10-CM

## 2012-12-15 DIAGNOSIS — R5381 Other malaise: Secondary | ICD-10-CM

## 2012-12-15 DIAGNOSIS — R5383 Other fatigue: Secondary | ICD-10-CM

## 2012-12-15 LAB — CBC WITH DIFFERENTIAL/PLATELET
BASO%: 0.5 % (ref 0.0–2.0)
MCHC: 33.7 g/dL (ref 32.0–36.0)
MONO#: 0.4 10*3/uL (ref 0.1–0.9)
NEUT#: 1.6 10*3/uL (ref 1.5–6.5)
RBC: 3.79 10*6/uL — ABNORMAL LOW (ref 4.20–5.82)
RDW: 17.2 % — ABNORMAL HIGH (ref 11.0–14.6)
lymph#: 1.5 10*3/uL (ref 0.9–3.3)
nRBC: 0 % (ref 0–0)

## 2012-12-15 LAB — COMPREHENSIVE METABOLIC PANEL (CC13)
ALT: 10 U/L (ref 0–55)
AST: 21 U/L (ref 5–34)
Calcium: 9.5 mg/dL (ref 8.4–10.4)
Chloride: 100 mEq/L (ref 98–109)
Creatinine: 1.3 mg/dL (ref 0.7–1.3)
Potassium: 4.1 mEq/L (ref 3.5–5.1)

## 2012-12-15 NOTE — Progress Notes (Signed)
[facility]  [phone] [phone]   OFFICE PROGRESS NOTE   Cc:  Gwynneth Aliment, MD  DIAGNOSIS: history of diagnosed stage IVA laryngeal cancer.   PAST THERAPY: total laryngectomy and right neck dissection on 08/13/2012. Received adjuvant chemoradiation 09/20/12 through 10/29/2012. Received chemotherapy with weekly Cisplatin.   CURRENT THERAPY: Watchful observation.    INTERVAL HISTORY: Cody [name] 67 y.o. male returns for regular follow up with his daughter. He reported feeling relatively well.  With treatment, he was less active compared to before.  Compounded by his gait abnormality at baseline, he has been having slight bilateral leg weakness.  He denied back pain, incontinence.  He is getting PT from Home [facility].  He is taking puree foods.  He is waiting for denture before trying regular foods.  He denied mucositis, pain, neck node swelling, dysphagia, odynophagia, bleeding symptoms, hearing loss.  The rest of the 14-point review of system was negative.    Past Medical History  Diagnosis Date  . TB (pulmonary tuberculosis) 1998  . Learning disabilities   . Cough   . Difficulty swallowing   . Pain on swallowing   . Abnormal weight loss documented 07/07/12     20 lbs  . Referred otalgia     Right Ear  . 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 Arthritis   . 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 S/P radiation therapy 09/20/12 - 10/29/12    Tumor bed/stoma/bilateral neck/60 Gy / 30 Fractions    Past Surgical History  Procedure Laterality Date  . Colonoscopy  2006    neg.   . Direct laryngoscopy  07/09/11    Epiglottic mass  . Direct laryngoscopy  07/08/2012    Procedure: DIRECT LARYNGOSCOPY;  Surgeon: Serena Colonel, MD;  Location: [facility] OR;  Service: ENT;  Laterality: N/A;  . Esophagoscopy  07/08/2012    Procedure: ESOPHAGOSCOPY;  Surgeon: Serena Colonel, MD;  Location: [facility] OR;   Service: ENT;  Laterality: N/A;  . Radical neck dissection Bilateral 08/13/2012    Procedure: RADICAL NECK DISSECTION;  Surgeon: Serena Colonel, MD;  Location: [facility] OR;  Service: ENT;  Laterality: Bilateral;  . Tracheostomy tube placement N/A 08/13/2012    Procedure: TRACHEOSTOMY;  Surgeon: Serena Colonel, MD;  Location: [facility] OR;  Service: ENT;  Laterality: N/A;  . Pectoralis flap N/A 08/13/2012    Procedure: RIGHT PECTORALIS MYOCUTANEOUS FLAP to LARYNX;  Surgeon: Etter Sjogren, MD;  Location: [facility] OR;  Service: Plastics;  Laterality: N/A;  . Multiple extractions with alveoloplasty N/A 08/19/2012    Procedure: Extraction of tooth #'s 1,2,3,4,5,6,10,11,12,13,14,16,17,18,19,21,22,23, 24,25,26,27,28,29,30,31,32 with alveoloplasty and bilateral mandibular buccal exostoses reductions;  Surgeon: Charlynne Pander, DDS;  Location: [facility] OR;  Service: Oral Surgery;  Laterality: N/A;  . Laryngoscopy N/A 08/19/2012    Procedure: LARYNGOSCOPY;  Surgeon: Serena Colonel, MD;  Location: [facility] OR;  Service: ENT;  Laterality: N/A;  Anesthesia's Laryngoscope    Current Outpatient Prescriptions  Medication Sig Dispense Refill  . Alum & Mag Hydroxide-Simeth (MAGIC MOUTHWASH W/LIDOCAINE) SOLN 1part nystatin,1part Maaloxplus,1part benadryl,3part 2%viscous lidocaine. Swish/swallow 10 mL up to QID, before meals/bedtime  480 mL  5  . LORazepam (ATIVAN) 0.5 MG tablet Take 0.5 mg by mouth every 6 (six) hours as needed (nausea or vomiting).      . ondansetron (ZOFRAN) 8 MG tablet Take 8 mg by mouth every 12 (twelve)  hours as needed for nausea.      . polyethylene glycol (MIRALAX) packet Take 17 g by mouth every other day as needed (severe constipation.).  14 each  5  . prochlorperazine (COMPAZINE) 10 MG tablet Take 10 mg by mouth every 6 (six) hours as needed (nausea).       No current facility-administered medications for this visit.    ALLERGIES:  has No Known Allergies.  REVIEW OF SYSTEMS:  The rest of the 14-point review of system was  negative.   Filed Vitals:   12/15/12 0830  BP: 108/70  Pulse: 76  Temp: 97.2 F (36.2 C)  Resp: 18   Wt Readings from Last 3 Encounters:  12/15/12 121 lb 12.8 oz (55.248 kg)  11/19/12 119 lb 3.2 oz (54.069 kg)  11/15/12 117 lb 3.2 oz (53.162 kg)   ECOG Performance status: 1-2  PHYSICAL EXAMINATION:   General:  Thin-appearing man,  in no acute distress.  Eyes:  no scleral icterus.  ENT:  There were no oropharyngeal lesions.  Neck was without thyromegaly.  Right neck dissection scar and trach stoma were well healed without erythema, purulent discharge, or foul odor.  Lymphatics:  Negative cervical, supraclavicular or axillary adenopathy.  Respiratory: lungs were clear bilaterally without wheezing or crackles.  Cardiovascular:  Regular rate and rhythm, S1/S2, without murmur, rub or gallop.  There was no pedal edema.  GI:  abdomen was soft, flat, nontender, nondistended, without organomegaly.  PEG tube was out with normal healing insertion wound. Musculoskeletal:  no spinal tenderness of palpation of vertebral spine.  Skin exam was without echymosis, petichae.  Neuro exam was nonfocal.  Patient was able to get on and off exam table without assistance.  Gait was slow and measured.  Patient was alert and oriented.  Attention was good.   Language was appropriate.  Mood was normal without depression.  Speech was not pressured.  Thought content was not tangential.      LABORATORY/RADIOLOGY DATA:  Lab Results  Component Value Date   WBC 3.7* 12/15/2012   HGB 11.9* 12/15/2012   HCT 35.3* 12/15/2012   PLT 151 12/15/2012   GLUCOSE 90 11/15/2012   ALKPHOS 77 11/15/2012   ALT 9 11/15/2012   AST 17 11/15/2012   NA 134* 11/15/2012   K 4.3 11/15/2012   CL 98 11/15/2012   CREATININE 1.4* 11/15/2012   BUN 11.1 11/15/2012   CO2 26 11/15/2012   INR 1.08 09/14/2012     ASSESSMENT AND PLAN:   1. Diagnosis: Head and neck cancer.   2. Status:  Finish adjuvant chemoradiation with grade 1 fatigue.   Dr. Basilio Cairo had already  scheduled for a follow up PET scan in September 2014.  He is not smoking cigarettes, chewing tobacco, or drink EtOH.  3. Deconditioning compounded by background gait abnormality:  Continue home PT.  4. Renal insufficiency:  Grade 1 due to chemo. Stable Cr.  5. Follow up:  In about 6 months.    I informed Dr. Laural Benes and his daughter that I am leaving the practice.  [facility] will [facility] for him to see another provider when he returns.     The length of time of the face-to-face encounter was 15 minutes. More than 50% of time was spent counseling and coordination of care.

## 2012-12-16 ENCOUNTER — Telehealth: Payer: Self-pay | Admitting: Oncology

## 2012-12-16 NOTE — Telephone Encounter (Signed)
, °

## 2013-01-12 ENCOUNTER — Other Ambulatory Visit: Payer: Self-pay

## 2013-02-01 ENCOUNTER — Ambulatory Visit (HOSPITAL_COMMUNITY): Payer: Self-pay | Admitting: Dentistry

## 2013-02-01 ENCOUNTER — Encounter (HOSPITAL_COMMUNITY): Payer: Self-pay | Admitting: Dentistry

## 2013-02-01 VITALS — BP 119/71 | HR 59 | Temp 97.9°F

## 2013-02-01 DIAGNOSIS — Z463 Encounter for fitting and adjustment of dental prosthetic device: Secondary | ICD-10-CM

## 2013-02-01 DIAGNOSIS — K08109 Complete loss of teeth, unspecified cause, unspecified class: Secondary | ICD-10-CM

## 2013-02-01 NOTE — Progress Notes (Signed)
02/01/2013  Patient:            Cody [name] Date of Birth:  08/10/1945 MRN:                [id number]  BP 119/71  Pulse 59  Temp(Src) 97.9 F (36.6 C) (Oral)   SAM OVERBECK presents for start of upper and lower denture fabrication. Exam: Patient is edentulous. Discussed procedures involved in upper and lower denture fabrication and prognosis for successful ability to wear dentures. Price for dentures confirmed.  Patient agrees to proceed with upper and lower denture fabrication. Procedure:  Upper and lower denture primary impressions in alginate. Lab pour. To [facility] for upper and lower denture custom tray fabrication. RTC for upper and lower denture final impressions.  Charlynne Pander, DDS

## 2013-02-01 NOTE — Patient Instructions (Addendum)
Return to clinic as scheduled for continued upper and lower complete denture fabrication. Cody [name] F. Cody [name], DDS 

## 2013-02-10 ENCOUNTER — Encounter (HOSPITAL_COMMUNITY): Payer: Self-pay | Admitting: Dentistry

## 2013-02-10 ENCOUNTER — Ambulatory Visit (HOSPITAL_COMMUNITY): Payer: Self-pay | Admitting: Dentistry

## 2013-02-10 VITALS — BP 112/75 | HR 80 | Temp 98.3°F

## 2013-02-10 DIAGNOSIS — Z463 Encounter for fitting and adjustment of dental prosthetic device: Secondary | ICD-10-CM

## 2013-02-10 DIAGNOSIS — K08109 Complete loss of teeth, unspecified cause, unspecified class: Secondary | ICD-10-CM

## 2013-02-10 DIAGNOSIS — K117 Disturbances of salivary secretion: Secondary | ICD-10-CM

## 2013-02-10 NOTE — Progress Notes (Signed)
02/10/2013 Patient:            Cody [name] Date of Birth:  [birth year] MRN:                [id number]  BP 112/75  Pulse 80  Temp(Src) 98.3 F (36.8 C) (Oral)  Cody [name] presents for continued upper and lower complete denture fabrication. Procedure:  Upper and lower border molding and final impressions in Aquasil. Patient tolerated procedure well. To [facility] for custom baseplates with rims. Return to clinic for upper and lower complete denture jaw relations.  Charlynne Pander, DDS

## 2013-02-10 NOTE — Patient Instructions (Signed)
Return to clinic as scheduled for continued upper and lower complete denture fabrication. Dr. Kulinski 

## 2013-02-16 ENCOUNTER — Encounter: Payer: Self-pay | Admitting: Internal Medicine

## 2013-02-16 NOTE — Progress Notes (Signed)
Daughter [name] called and inquired about grant. She was getting that and discount confused. I advised her to call billing and make sure the ones gone to collections did not go in error.Marland KitchenMarland KitchenI gave dates of 4/21-10/21/14. I advised her she must call when she gets bills and have discount taken. If bill she will bring to me to pay.

## 2013-02-21 ENCOUNTER — Ambulatory Visit (HOSPITAL_COMMUNITY): Payer: Self-pay | Admitting: Dentistry

## 2013-02-21 ENCOUNTER — Encounter (HOSPITAL_COMMUNITY): Payer: Self-pay | Admitting: Dentistry

## 2013-02-21 VITALS — BP 113/68 | HR 69 | Temp 98.5°F

## 2013-02-21 DIAGNOSIS — Z463 Encounter for fitting and adjustment of dental prosthetic device: Secondary | ICD-10-CM

## 2013-02-21 NOTE — Progress Notes (Signed)
02/21/2013  Patient:            Cody [name] Date of Birth:  1945-07-03 MRN:                [id number]  BP 113/68  Pulse 69  Temp(Src) 98.5 F (36.9 C) (Oral)  Cody [name] presents for continued denture fabrication. Procedure:  Upper and lower denture Jaw relations with aluwax bite registration. Patient agrees to tooth selection of 21X, P, and 10 degree posteriors to match with Portrait A2 shade. Patient tolerated procedure well. RTC for denture wax try in.  Charlynne Pander, DDS

## 2013-02-21 NOTE — Patient Instructions (Signed)
Return to clinic as scheduled for continued fabrication of upper and lower complete dentures. Cody [name], DDS 

## 2013-02-23 ENCOUNTER — Encounter (HOSPITAL_COMMUNITY): Payer: Self-pay

## 2013-02-23 ENCOUNTER — Other Ambulatory Visit (HOSPITAL_BASED_OUTPATIENT_CLINIC_OR_DEPARTMENT_OTHER): Payer: Medicare Other | Admitting: Lab

## 2013-02-23 ENCOUNTER — Other Ambulatory Visit: Payer: Self-pay | Admitting: Internal Medicine

## 2013-02-23 ENCOUNTER — Encounter (HOSPITAL_COMMUNITY)
Admission: RE | Admit: 2013-02-23 | Discharge: 2013-02-23 | Disposition: A | Discharge status: Home / Self Care | Payer: Medicare Other | Source: Ambulatory Visit | Attending: Oncology | Admitting: Oncology

## 2013-02-23 ENCOUNTER — Telehealth: Payer: Self-pay | Admitting: Internal Medicine

## 2013-02-23 DIAGNOSIS — R5381 Other malaise: Secondary | ICD-10-CM

## 2013-02-23 DIAGNOSIS — C321 Malignant neoplasm of supraglottis: Secondary | ICD-10-CM

## 2013-02-23 DIAGNOSIS — E038 Other specified hypothyroidism: Secondary | ICD-10-CM

## 2013-02-23 DIAGNOSIS — R5383 Other fatigue: Secondary | ICD-10-CM

## 2013-02-23 LAB — CBC WITH DIFFERENTIAL/PLATELET
Basophils Absolute: 0 10*3/uL (ref 0.0–0.1)
Eosinophils Absolute: 0.1 10*3/uL (ref 0.0–0.5)
HCT: 34.7 % — ABNORMAL LOW (ref 38.4–49.9)
HGB: 11.8 g/dL — ABNORMAL LOW (ref 13.0–17.1)
MONO#: 0.4 10*3/uL (ref 0.1–0.9)
NEUT#: 3.5 10*3/uL (ref 1.5–6.5)
NEUT%: 67.7 % (ref 39.0–75.0)
WBC: 5.2 10*3/uL (ref 4.0–10.3)
lymph#: 1.2 10*3/uL (ref 0.9–3.3)

## 2013-02-23 LAB — COMPREHENSIVE METABOLIC PANEL (CC13)
Albumin: 3.7 g/dL (ref 3.5–5.0)
BUN: 16.9 mg/dL (ref 7.0–26.0)
Calcium: 9.7 mg/dL (ref 8.4–10.4)
Chloride: 100 mEq/L (ref 98–109)
Glucose: 94 mg/dl (ref 70–140)
Potassium: 4.4 mEq/L (ref 3.5–5.1)
Total Protein: 9.3 g/dL — ABNORMAL HIGH (ref 6.4–8.3)

## 2013-02-23 LAB — T4, FREE: Free T4: 0.52 ng/dL — ABNORMAL LOW (ref 0.80–1.80)

## 2013-02-23 LAB — GLUCOSE, CAPILLARY: Glucose-Capillary: 90 mg/dL (ref 70–99)

## 2013-02-23 MED ORDER — LEVOTHYROXINE SODIUM 25 MCG PO TABS: 25.0000 ug | ORAL_TABLET | Freq: Every day | ORAL | Status: DC

## 2013-02-23 MED ORDER — FLUDEOXYGLUCOSE F - 18 (FDG) INJECTION
16.6000 | Freq: Once | INTRAVENOUS | Status: AC | PRN
  Administered 2013-02-23: 16.6 via INTRAVENOUS

## 2013-02-23 NOTE — Telephone Encounter (Signed)
I spoke with the patient's daughter and informed her of his elevated TSH of 80.  I ordered free t4 and plan to start 25 mcg of synthroid tomorrow.  She was instructed on symptoms of hypothyroidism including weight gain, constipation mood changes.  She was instructed to call with questions.

## 2013-02-24 ENCOUNTER — Encounter: Payer: Self-pay | Admitting: Radiation Oncology

## 2013-02-25 ENCOUNTER — Telehealth: Payer: Self-pay | Admitting: Hematology and Oncology

## 2013-02-25 ENCOUNTER — Ambulatory Visit (HOSPITAL_BASED_OUTPATIENT_CLINIC_OR_DEPARTMENT_OTHER): Payer: Medicare Other | Admitting: Hematology and Oncology

## 2013-02-25 ENCOUNTER — Encounter: Payer: Self-pay | Admitting: *Deleted

## 2013-02-25 ENCOUNTER — Encounter: Payer: Self-pay | Admitting: Hematology and Oncology

## 2013-02-25 ENCOUNTER — Ambulatory Visit
Admission: RE | Admit: 2013-02-25 | Discharge: 2013-02-25 | Disposition: A | Discharge status: Home / Self Care | Payer: Medicare Other | Source: Ambulatory Visit | Attending: Radiation Oncology | Admitting: Radiation Oncology

## 2013-02-25 VITALS — BP 121/74 | HR 87 | Temp 98.0°F | Resp 20 | Ht 68.0 in | Wt 126.1 lb

## 2013-02-25 VITALS — BP 113/68 | HR 77 | Temp 98.3°F | Ht 68.0 in | Wt 126.5 lb

## 2013-02-25 DIAGNOSIS — E038 Other specified hypothyroidism: Secondary | ICD-10-CM

## 2013-02-25 DIAGNOSIS — C321 Malignant neoplasm of supraglottis: Secondary | ICD-10-CM

## 2013-02-25 DIAGNOSIS — E039 Hypothyroidism, unspecified: Secondary | ICD-10-CM | POA: Insufficient documentation

## 2013-02-25 DIAGNOSIS — C7951 Secondary malignant neoplasm of bone: Secondary | ICD-10-CM

## 2013-02-25 HISTORY — DX: Personal history of antineoplastic chemotherapy: Z92.21

## 2013-02-25 MED ORDER — LEVOTHYROXINE SODIUM 25 MCG PO TABS: 25.0000 ug | ORAL_TABLET | Freq: Every day | ORAL | Status: DC

## 2013-02-25 NOTE — Telephone Encounter (Signed)
Cody [name] appt schedule for September including appt for 9/25 w/Dr. Johna Sheriff. [name] needs port placement but will needs to be established 1st as a new [name]. Office will discuss port placement w/[name]. Relative/[name] aware.

## 2013-02-25 NOTE — Progress Notes (Signed)
Coady Train here with his son and daughter for follow up after treatment to his bilateral neck.  He denies pain.  He is able to eat soft and pureed foods.  He has an appointment with Dr. Ileene Hutchinson on 9/23 to receive dentures to try.  His weight is up 5 lbs from 12/15/12.  His oral mucosa is intact.  He denies a dry mouth.  He is using salt and baking powder rinses and biotene.  His stoma is red with a small amount of crusting in the entrances.   He does have fatigue.

## 2013-02-25 NOTE — Progress Notes (Signed)
Previously met with pt and his family during final appt w/ Dr. Gaylyn Rong 12/15/12 at which time chemo/RT had been completed.  Presently joining them during f/u visit with Dr. Basilio Cairo.  Beginning L1 navigation (new pt) d/t new diagnosis.   Young Berry, RN, BSN, [facility] Head & Neck Oncology Navigator [phone]

## 2013-02-25 NOTE — Progress Notes (Signed)
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  Unfortunately, his PET scan this week revealed lesions highly suggestive of bone metastases.  He just met with medical oncology  and Dr. Bertis Ruddy  recommends placement of Infuse-a-Port as soon as possible and consideration to start him on systemic chemotherapy in a palliative fashion in the near future.  Medical oncology hasmade a return appointment for the patient to come back within the next 10 days to discuss about palliative treatment options.  He is asymptomatic from his metastases. He denies any pain associated with the metastases. He has gained weight. He eats soft foods. His family remains very committed to supporting him, he is here with his children today.He denies a dry mouth. He is using salt and baking powder rinses and biotene. He does have fatigue.                           ALLERGIES:  has No Known Allergies.  Meds: Current Outpatient Prescriptions  Medication Sig Dispense Refill  . polyethylene glycol (MIRALAX) packet Take 17 g by mouth every other day as needed (severe constipation.).  14 each  5  . Alum & Mag Hydroxide-Simeth (MAGIC MOUTHWASH W/LIDOCAINE) SOLN 1part nystatin,1part Maaloxplus,1part benadryl,3part 2%viscous lidocaine. Swish/swallow 10 mL up to QID, before meals/bedtime  480 mL  5  . levothyroxine (SYNTHROID, LEVOTHROID) 25 MCG tablet Take 1 tablet (25 mcg total) by mouth daily before breakfast.  30 tablet  1  . LORazepam  (ATIVAN) 0.5 MG tablet Take 0.5 mg by mouth every 6 (six) hours as needed (nausea or vomiting).      . ondansetron (ZOFRAN) 8 MG tablet Take 8 mg by mouth every 12 (twelve) hours as needed for nausea.      . prochlorperazine (COMPAZINE) 10 MG tablet Take 10 mg by mouth every 6 (six) hours as needed (nausea).       No current facility-administered medications for this encounter.    Physical Findings: The patient is in no acute distress. Patient is alert and oriented.  height is 5\' 8"  (1.727 m) and weight is 126 lb 8 oz (57.38 kg). His temperature is 98.3 F (36.8 C). His blood pressure is 113/68 and his pulse is 77. His oxygen saturation is 100%. .  Oropharynx - no lesions.  Stoma - clear, no discharge. No palpable neck adenopathy in SCV or cervical regions.  Lab Findings: Lab Results  Component Value Date   WBC 5.2 02/23/2013   HGB 11.8* 02/23/2013   HCT 34.7* 02/23/2013   MCV 93.0 02/23/2013   PLT 199 02/23/2013   CMP     Component Value Date/Time   NA 133* 02/23/2013 0820   NA 136 08/19/2012 0625   K 4.4 02/23/2013 0820   K 4.3 08/19/2012 0625   CL 98 11/15/2012 0941   CL 100 08/19/2012 0625   CO2 25 02/23/2013 0820   CO2 28 08/19/2012 0625   GLUCOSE 94 02/23/2013 0820   GLUCOSE 90 11/15/2012 0941   GLUCOSE 109*  08/19/2012 0625   BUN 16.9 02/23/2013 0820   BUN 9 08/19/2012 0625   CREATININE 1.2 02/23/2013 0820   CREATININE 0.69 08/19/2012 0625   CALCIUM 9.7 02/23/2013 0820   CALCIUM 8.3* 08/20/2012 0542   PROT 9.3* 02/23/2013 0820   PROT 6.9 08/19/2012 0625   ALBUMIN 3.7 02/23/2013 0820   ALBUMIN 2.4* 08/19/2012 0625   AST 22 02/23/2013 0820   AST 25 08/19/2012 0625   ALT 14 02/23/2013 0820   ALT 20 08/19/2012 0625   ALKPHOS 67 02/23/2013 0820   ALKPHOS 57 08/19/2012 0625   BILITOT 0.49 02/23/2013 0820   BILITOT 0.6 08/19/2012 0625   GFRNONAA >90 08/19/2012 0625   GFRAA >90 08/19/2012 0625      Radiographic Findings: Nm Pet Image Restag (ps) Skull Base To Thigh  02/23/2013   *RADIOLOGY  REPORT*  Clinical Data: Subsequent treatment strategy for head and neck cancer.  NUCLEAR MEDICINE PET SKULL BASE TO THIGH  Fasting Blood Glucose:  90  Technique:  16.6 mCi F-18 FDG was injected intravenously. CT data was obtained and used for attenuation correction and anatomic localization only.  (This was not acquired as a diagnostic CT examination.) Additional exam technical data entered on technologist worksheet.  Comparison:  07/20/2012  Findings:  Neck: Resolution of previous hypermetabolic adenopathy.  Extensive postsurgical and post-therapy changes are identified within the neck including the flap reconstruction and tracheostomy.  Chest:  No hypermetabolic mediastinal or hilar nodes.  Progressive biapical scarring identified.  Scar like density within the left apex is new from previous exam.  There is associated increased FDG uptake within this area with an SUV max equal to if this area was within the radiation port and this is favored to represent changes of external beam radiation.  No abnormal areas of increased uptake are noted within the mid or lower lung zones.  Abdomen/Pelvis:  No abnormal hypermetabolic activity within the liver, pancreas, adrenal glands, or spleen.  No hypermetabolic lymph nodes in the abdomen or pelvis.  Skeleton:  Multi focal abnormal areas of increased uptake are identified within the axial skeleton compatible with bone metastasis.  Small focus of intense FDG uptake within the sternum measures approximately 8 mm and has an SUV max equal to 7.0.  Two foci of abnormal increased uptake are identified within the sacrum. Within the right iliac wing there is an intense focus of increased uptake measuring 1.6 cm.  This has an SUV max equal to 7.5.  IMPRESSION:  1.  Examination is positive for multi focal hypermetabolic bone metastases. 2.  There are extensive post surgical and post therapeutic changes within the soft tissues of the neck.  No specific features identified to suggest local  tumor recurrence. 3.  Progressive biapical scar like densities with associated increased FDG uptake.  If the lung apices were included within the radiation port then I suspect the findings likely reflect inflammation secondary to external beam radiation.   Original Report Authenticated By: Signa Kell, M.D.    Impression/Plan:    1) Head and Neck Cancer Status: Good local control, however he has developed distant bone metastases  2) Nutritional Status: - weight: Rising - PEG tube: Removed in June 2014  3) Risk Factors: The patient has been educated about risk factors including alcohol and tobacco abuse; they understand that avoidance of alcohol and tobacco is important to prevent recurrences as well as other cancers  4) Swallowing: Good with soft Foods  5) Dental: He is edentulous and underwent denture fabrication  earlier this month  6) Energy: While not clearly symptomatic, his TSH was very high yesterday, 80. Today was started on a thyroid supplement with Dr. Bertis Ruddy  7) Social: No active social issues to address at this time  8) Other: start chemotherapy per med/onc  9) Follow-up in March 2015. The patient was encouraged to call with any issues or questions before then.  I spent 20 minutes minutes face to face with the patient and more than 50% of that time was spent in counseling and/or coordination of care. _____________________________________   Lonie Peak, MD

## 2013-02-25 NOTE — Progress Notes (Signed)
[facility] OFFICE PROGRESS NOTE  Gwynneth Aliment, MD [address] [place] [address] No chief complaint on file.  DIAGNOSIS: Supraglottic squamous cell carcinoma status post laryngectomy and adjuvant treatment  SUMMARY OF ONCOLOGIC HISTORY: I have reviewed his truck extensively and collaborated the history with the patient. He presented to the emergency department in January with a neck mass. After a multidisciplinary discussion, it was decided the patient to proceed with surgery followed by adjuvant treatment. In January 2014 initial biopsy of the right supraglottic region confirmed squamous cell carcinoma On 08/13/2012 he underwent laryngectomy and bilateral lymph node dissection. All margins were negative. There were bilateral lymph node involvement, and 10/25 lymph nodes were positive for extracapsular extension. Between 09/20/2012 to 10/29/2012 he received concurrent chemoradiation therapy with weekly cisplatin.  INTERVAL HISTORY: Cody [name] 67 y.o. male returns for routine followup. He has gained 5 pounds of weight since the last time he was seen here. He still has difficulty swallowing food tends to use a pured diet. He chews some of his meals such as bread and rice without having them pured. He denies any peripheral neuropathy. He still have persistent dry mouth but they're getting better. Denies any swelling of his neck. He has chronic musculoskeletal pain worse on his right shoulder. Denies any new neurological deficit.  I have reviewed the past medical history, past surgical history, social history and family history with the patient and they are unchanged from previous note.  ALLERGIES:  has No Known Allergies.  MEDICATIONS: has a current medication list which includes the following prescription(s): magic mouthwash w/lidocaine, levothyroxine, lorazepam, ondansetron, polyethylene glycol, and prochlorperazine.  REVIEW OF SYSTEMS:    Constitutional: Denies fevers, chills or abnormal weight loss Eyes: Denies blurriness of vision Ears, nose, mouth, throat, and face: Denies mucositis or sore throat Respiratory: Denies cough, dyspnea or wheezes Cardiovascular: Denies palpitation, chest discomfort or lower extremity swelling Gastrointestinal:  Denies nausea, heartburn or change in bowel habits Skin: Denies abnormal skin rashes Lymphatics: Denies new lymphadenopathy or easy bruising Neurological:Denies numbness, tingling or new weaknesses Behavioral/Psych: Mood is stable, no new changes  All other systems were reviewed with the patient and are negative.  PHYSICAL EXAMINATION: ECOG PERFORMANCE STATUS: 0 - Asymptomatic  Filed Vitals:   02/25/13 0832  BP: 121/74  Pulse: 87  Temp: 98 F (36.7 C)  Resp: 20    GENERAL:alert, no distress and comfortable SKIN: skin color, texture, turgor are normal, no rashes or significant lesions EYES: normal, Conjunctiva are pink and non-injected, sclera clear OROPHARYNX:no exudate, no erythema and lips, buccal mucosa, and tongue normal. Well-healed surgical scar NECK: supple, thyroid normal size, non-tender, without nodularity. Tracheostomy site looks clean with no evidence of surrounding infection. LYMPH:  no palpable lymphadenopathy in the cervical, axillary or inguinal LUNGS: clear to auscultation and percussion with normal breathing effort HEART: regular rate & rhythm and no murmurs and no lower extremity edema ABDOMEN:abdomen soft, non-tender and normal bowel sounds Musculoskeletal:no cyanosis of digits and no clubbing  NEURO: alert & oriented x 3 with, speech via mechanical device, no focal motor/sensory deficits  LABORATORY DATA:  I have reviewed the data as listed    Component Value Date/Time   NA 133* 02/23/2013 0820   NA 136 08/19/2012 0625   K 4.4 02/23/2013 0820   K 4.3 08/19/2012 0625   CL 98 11/15/2012 0941   CL 100 08/19/2012 0625   CO2 25 02/23/2013 0820   CO2 28  08/19/2012 6045  GLUCOSE 94 02/23/2013 0820   GLUCOSE 90 11/15/2012 0941   GLUCOSE 109* 08/19/2012 0625   BUN 16.9 02/23/2013 0820   BUN 9 08/19/2012 0625   CREATININE 1.2 02/23/2013 0820   CREATININE 0.69 08/19/2012 0625   CALCIUM 9.7 02/23/2013 0820   CALCIUM 8.3* 08/20/2012 0542   PROT 9.3* 02/23/2013 0820   PROT 6.9 08/19/2012 0625   ALBUMIN 3.7 02/23/2013 0820   ALBUMIN 2.4* 08/19/2012 0625   AST 22 02/23/2013 0820   AST 25 08/19/2012 0625   ALT 14 02/23/2013 0820   ALT 20 08/19/2012 0625   ALKPHOS 67 02/23/2013 0820   ALKPHOS 57 08/19/2012 0625   BILITOT 0.49 02/23/2013 0820   BILITOT 0.6 08/19/2012 0625   GFRNONAA >90 08/19/2012 0625   GFRAA >90 08/19/2012 0625    I No results found for this basename: SPEP, UPEP,  kappa and lambda light chains    Lab Results  Component Value Date   WBC 5.2 02/23/2013   NEUTROABS 3.5 02/23/2013   HGB 11.8* 02/23/2013   HCT 34.7* 02/23/2013   MCV 93.0 02/23/2013   PLT 199 02/23/2013      Chemistry      Component Value Date/Time   NA 133* 02/23/2013 0820   NA 136 08/19/2012 0625   K 4.4 02/23/2013 0820   K 4.3 08/19/2012 0625   CL 98 11/15/2012 0941   CL 100 08/19/2012 0625   CO2 25 02/23/2013 0820   CO2 28 08/19/2012 0625   BUN 16.9 02/23/2013 0820   BUN 9 08/19/2012 0625   CREATININE 1.2 02/23/2013 0820   CREATININE 0.69 08/19/2012 0625      Component Value Date/Time   CALCIUM 9.7 02/23/2013 0820   CALCIUM 8.3* 08/20/2012 0542   ALKPHOS 67 02/23/2013 0820   ALKPHOS 57 08/19/2012 0625   AST 22 02/23/2013 0820   AST 25 08/19/2012 0625   ALT 14 02/23/2013 0820   ALT 20 08/19/2012 0625   BILITOT 0.49 02/23/2013 0820   BILITOT 0.6 08/19/2012 0625       No results found for this basename: LABCA2    No components found with this basename: LABCA125    No results found for this basename: INR,  in the last 168 hours  Urinalysis    Component Value Date/Time   COLORURINE YELLOW 08/13/2012 1858   APPEARANCEUR CLEAR 08/13/2012 1858   LABSPEC 1.017 08/13/2012 1858    PHURINE 6.5 08/13/2012 1858   GLUCOSEU NEGATIVE 08/13/2012 1858   HGBUR TRACE* 08/13/2012 1858   BILIRUBINUR NEGATIVE 08/13/2012 1858   KETONESUR NEGATIVE 08/13/2012 1858   PROTEINUR NEGATIVE 08/13/2012 1858   UROBILINOGEN 1.0 08/13/2012 1858   NITRITE NEGATIVE 08/13/2012 1858   LEUKOCYTESUR SMALL* 08/13/2012 1858     RADIOGRAPHIC STUDIES: I have personally reviewed the radiological images as listed and agreed with the findings in the report. Nm Pet Image Restag (ps) Skull Base To Thigh  02/23/2013   *RADIOLOGY REPORT*  Clinical Data: Subsequent treatment strategy for head and neck cancer.  NUCLEAR MEDICINE PET SKULL BASE TO THIGH  Fasting Blood Glucose:  90  Technique:  16.6 mCi F-18 FDG was injected intravenously. CT data was obtained and used for attenuation correction and anatomic localization only.  (This was not acquired as a diagnostic CT examination.) Additional exam technical data entered on technologist worksheet.  Comparison:  07/20/2012  Findings:  Neck: Resolution of previous hypermetabolic adenopathy.  Extensive postsurgical and post-therapy changes are identified within the neck including the flap reconstruction and  tracheostomy.  Chest:  No hypermetabolic mediastinal or hilar nodes.  Progressive biapical scarring identified.  Scar like density within the left apex is new from previous exam.  There is associated increased FDG uptake within this area with an SUV max equal to if this area was within the radiation port and this is favored to represent changes of external beam radiation.  No abnormal areas of increased uptake are noted within the mid or lower lung zones.  Abdomen/Pelvis:  No abnormal hypermetabolic activity within the liver, pancreas, adrenal glands, or spleen.  No hypermetabolic lymph nodes in the abdomen or pelvis.  Skeleton:  Multi focal abnormal areas of increased uptake are identified within the axial skeleton compatible with bone metastasis.  Small focus of intense FDG uptake within  the sternum measures approximately 8 mm and has an SUV max equal to 7.0.  Two foci of abnormal increased uptake are identified within the sacrum. Within the right iliac wing there is an intense focus of increased uptake measuring 1.6 cm.  This has an SUV max equal to 7.5.  IMPRESSION:  1.  Examination is positive for multi focal hypermetabolic bone metastases. 2.  There are extensive post surgical and post therapeutic changes within the soft tissues of the neck.  No specific features identified to suggest local tumor recurrence. 3.  Progressive biapical scar like densities with associated increased FDG uptake.  If the lung apices were included within the radiation port then I suspect the findings likely reflect inflammation secondary to external beam radiation.   Original Report Authenticated By: Signa Kell, M.D.   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 Given his advanced presentation earlier this year, I have no doubt that the abnormal imaging study likely represent a recurrence of disease. I would not recommend pursuing a biopsy just to prove that it was the same cancer that was treated earlier this year. I recommend placement of Infuse-a-Port as soon as possible and consideration to start him on systemic chemotherapy in a palliative fashion in the near future. I will consult general surgery for this. I have made a return appointment for the patient to come back within the next 10 days to discuss about palliative treatment options. I will discuss this case with his radiation  oncologist. #3 hypothyroidism The patient has not been taking his thyroid supplement and his thyroid function tests are grossly abnormal. We will prescribe his thyroid supplement to start as soon as possible.  All questions were answered. The patient knows to call the clinic with any problems, questions or concerns. We can certainly see the patient much sooner if necessary. No barriers to learning was detected.  The patient and plan discussed with [facility], Vincenta Steffey  and he is in agreement with the aforementioned.  I spent 40 minutes counseling the patient face to face. The total time spent in the appointment was 60 minutes and more than 50% was on counseling.     Makhiya Coburn, MD 02/25/2013 10:28 AM

## 2013-02-28 ENCOUNTER — Encounter: Payer: Self-pay | Admitting: Radiation Oncology

## 2013-03-01 ENCOUNTER — Other Ambulatory Visit (HOSPITAL_COMMUNITY): Payer: Self-pay | Admitting: Dentistry

## 2013-03-02 ENCOUNTER — Encounter (HOSPITAL_COMMUNITY): Payer: Self-pay | Admitting: Dentistry

## 2013-03-02 ENCOUNTER — Ambulatory Visit (HOSPITAL_COMMUNITY): Payer: Self-pay | Admitting: Dentistry

## 2013-03-02 VITALS — BP 116/73 | HR 69 | Temp 98.3°F

## 2013-03-02 DIAGNOSIS — Z463 Encounter for fitting and adjustment of dental prosthetic device: Secondary | ICD-10-CM

## 2013-03-02 NOTE — Progress Notes (Signed)
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 Procedure:   Upper and lower denture wax tryin. Patient accepts esthetics, phonetics, fit and function. Patient agrees to process "as is" in  50:50 [place] 199. Patient to RTC for  upper and lower denture insertion.  Charlynne Pander, DDS

## 2013-03-02 NOTE — Patient Instructions (Signed)
Return to clinic as scheduled for insertion of upper lower complete dentures. Charlynne Pander, DDS

## 2013-03-03 ENCOUNTER — Encounter (INDEPENDENT_AMBULATORY_CARE_PROVIDER_SITE_OTHER): Payer: Self-pay | Admitting: General Surgery

## 2013-03-03 ENCOUNTER — Ambulatory Visit (INDEPENDENT_AMBULATORY_CARE_PROVIDER_SITE_OTHER): Payer: Medicare Other | Admitting: General Surgery

## 2013-03-03 VITALS — BP 110/80 | HR 88 | Temp 96.2°F | Resp 14 | Ht 71.0 in | Wt 127.6 lb

## 2013-03-03 DIAGNOSIS — C321 Malignant neoplasm of supraglottis: Secondary | ICD-10-CM

## 2013-03-03 NOTE — Progress Notes (Signed)
Subjective:   Cancer, need for Port-A-Cath  Patient ID: Cody [name], male   DOB: 29-Mar-1946, 67 y.o.   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 He has a history of surgical resection with radiation and chemotherapy and flap reconstruction using the right pectoralis muscle. He unfortunately has been recently found to have multiple bone metastases which currently are asymptomatic. Palliative chemotherapy has been recommended and I was asked to see the patient for Port-A-Cath placement. He currently denies pain or shortness of breath.  Past Medical History  Diagnosis Date  . TB (pulmonary tuberculosis) 1998  . Learning disabilities   . Cough   . Difficulty swallowing   . Pain on swallowing   . Abnormal weight loss documented 07/07/12     20 lbs  . Referred otalgia     Right Ear  . 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 Arthritis   . 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 S/P radiation therapy 09/20/12 - 10/29/12    Tumor bed/stoma/bilateral neck/60 Gy / 30 Fractions  . Status post chemotherapy     adjuvant chemoradiation 09/20/12 through 10/29/2012. Received chemotherapy with weekly Cisplatin.    . Thyroid disease    Past Surgical History  Procedure Laterality Date  . Colonoscopy  2006    neg.   . Direct laryngoscopy  07/09/11    Epiglottic mass  . Direct laryngoscopy  07/08/2012    Procedure: DIRECT LARYNGOSCOPY;  Surgeon: Serena Colonel, MD;  Location: [facility] OR;  Service: ENT;  Laterality: N/A;  . Esophagoscopy  07/08/2012    Procedure: ESOPHAGOSCOPY;  Surgeon: Serena Colonel, MD;  Location: [facility] OR;  Service: ENT;  Laterality: N/A;  . Radical neck dissection Bilateral 08/13/2012    Procedure: RADICAL NECK DISSECTION;  Surgeon: Serena Colonel, MD;  Location: [facility] OR;  Service: ENT;  Laterality: Bilateral;  . Tracheostomy tube placement N/A 08/13/2012   Procedure: TRACHEOSTOMY;  Surgeon: Serena Colonel, MD;  Location: [facility] OR;  Service: ENT;  Laterality: N/A;  . Pectoralis flap N/A 08/13/2012    Procedure: RIGHT PECTORALIS MYOCUTANEOUS FLAP to LARYNX;  Surgeon: Etter Sjogren, MD;  Location: [facility] OR;  Service: Plastics;  Laterality: N/A;  . Multiple extractions with alveoloplasty N/A 08/19/2012    Procedure: Extraction of tooth #'s 1,2,3,4,5,6,10,11,12,13,14,16,17,18,19,21,22,23, 24,25,26,27,28,29,30,31,32 with alveoloplasty and bilateral mandibular buccal exostoses reductions;  Surgeon: Charlynne Pander, DDS;  Location: [facility] OR;  Service: Oral Surgery;  Laterality: N/A;  . Laryngoscopy N/A 08/19/2012    Procedure: LARYNGOSCOPY;  Surgeon: Serena Colonel, MD;  Location: [facility] OR;  Service: ENT;  Laterality: N/A;  Anesthesia's Laryngoscope   Current Outpatient Prescriptions  Medication Sig Dispense Refill  . Alum & Mag Hydroxide-Simeth (MAGIC MOUTHWASH W/LIDOCAINE) SOLN 1part nystatin,1part Maaloxplus,1part benadryl,3part 2%viscous lidocaine. Swish/swallow 10 mL up to QID, before meals/bedtime  480 mL  5  . levothyroxine (SYNTHROID, LEVOTHROID) 25 MCG tablet Take 1 tablet (25 mcg total) by mouth daily before breakfast.  30 tablet  1  . polyethylene glycol (MIRALAX) packet Take 17 g by mouth every other day as needed (severe constipation.).  14 each  5   No current facility-administered medications for this visit.     Review of Systems  Respiratory: Negative.   Cardiovascular: Negative.        Objective:   Physical Exam BP 110/80  Pulse  88  Temp(Src) 96.2 F (35.7 C) (Temporal)  Resp 14  Ht 5\' 11"  (1.803 m)  Wt 127 lb 9.3 oz (57.87 kg)  BMI 17.8 kg/m2 General: Very thin Afro-American male in no distress Skin: No rash or infection HEENT: Well-healed tracheostomy. Status post right-sided neck dissection with pectoralis flap in the right neck over lying the right clavicle. Left neck feels firm but no definite mass or adenopathy. Chest: Breath  sounds clear and equal. No increased work of breathing. Left infraclavicular area normal to exam. Cardiovascular: Regular rate and rhythm. No edema Abdomen: Soft nontender Extremities: No edema    Assessment:     Stage IV squamous cancer of the supraglottis requiring long-term IV access and Port-A-Cath placement has been requested. His options for placement or limited due to his previous surgery and radiation and I don't think there is any reasonable locations except for the left subclavian vein. I discussed Port-A-Cath placement with the patient and his daughter. We will plan to do this under sedation and local anesthesia. We discussed that with our only option being the left subclavian vein that if this vein is difficult to locate I may not be able to place the catheter. We discussed the nature of the procedure and risks of bleeding, infection, pneumothorax and long-term risk for thrombosis, occlusion, displacement and infection. They understand and agree to proceed we will get this scheduled as soon as possible.    Plan:     Port-A-Cath placement under local anesthesia with sedation in the left subclavian position.

## 2013-03-03 NOTE — Patient Instructions (Addendum)
Implanted Port Instructions  An implanted port is a central line that has a round shape and is placed under the skin. It is used for long-term IV (intravenous) access for:  · Medicine.  · Fluids.  · Liquid nutrition, such as TPN (total parenteral nutrition).  · Blood samples.  Ports can be placed:  · In the chest area just below the collarbone (this is the most common place.)  · In the arms.  · In the belly (abdomen) area.  · In the legs.  PARTS OF THE PORT  A port has 2 main parts:  · The reservoir. The reservoir is round, disc-shaped, and will be a small, raised area under your skin.  · The reservoir is the part where a needle is inserted (accessed) to either give medicines or to draw blood.  · The catheter. The catheter is a long, slender tube that extends from the reservoir. The catheter is placed into a large vein.  · Medicine that is inserted into the reservoir goes into the catheter and then into the vein.  INSERTION OF THE PORT  · The port is surgically placed in either an operating room or in a procedural area (interventional radiology).  · Medicine may be given to help you relax during the procedure.  · The skin where the port will be inserted is numbed (local anesthetic).  · 1 or 2 small cuts (incisions) will be made in the skin to insert the port.  · The port can be used after it has been inserted.  INCISION SITE CARE  · The incision site may have small adhesive strips on it. This helps keep the incision site closed. Sometimes, no adhesive strips are placed. Instead of adhesive strips, a special kind of surgical glue is used to keep the incision closed.  · If adhesive strips were placed on the incision sites, do not take them off. They will fall off on their own.  · The incision site may be sore for 1 to 2 days. Pain medicine can help.  · Do not get the incision site wet. Bathe or shower as directed by your caregiver.  · The incision site should heal in 5 to 7 days. A small scar may form after the  incision has healed.  ACCESSING THE PORT  Special steps must be taken to access the port:  · Before the port is accessed, a numbing cream can be placed on the skin. This helps numb the skin over the port site.  · A sterile technique is used to access the port.  · The port is accessed with a needle. Only "non-coring" port needles should be used to access the port. Once the port is accessed, a blood return should be checked. This helps ensure the port is in the vein and is not clogged (clotted).  · If your caregiver believes your port should remain accessed, a clear (transparent) bandage will be placed over the needle site. The bandage and needle will need to be changed every week or as directed by your caregiver.  · Keep the bandage covering the needle clean and dry. Do not get it wet. Follow your caregiver's instructions on how to take a shower or bath when the port is accessed.  · If your port does not need to stay accessed, no bandage is needed over the port.  FLUSHING THE PORT  Flushing the port keeps it from getting clogged. How often the port is flushed depends on:  · If a   constant infusion is running. If a constant infusion is running, the port may not need to be flushed.  · If intermittent medicines are given.  · If the port is not being used.  For intermittent medicines:  · The port will need to be flushed:  · After medicines have been given.  · After blood has been drawn.  · As part of routine maintenance.  · A port is normally flushed with:  · Normal saline.  · Heparin.  · Follow your caregiver's advice on how often, how much, and the type of flush to use on your port.  IMPORTANT PORT INFORMATION  · Tell your caregiver if you are allergic to heparin.  · After your port is placed, you will get a manufacturer's information card. The card has information about your port. Keep this card with you at all times.  · There are many types of ports available. Know what kind of port you have.  · In case of an  emergency, it may be helpful to wear a medical alert bracelet. This can help alert health care workers that you have a port.  · The port can stay in for as long as your caregiver believes it is necessary.  · When it is time for the port to come out, surgery will be done to remove it. The surgery will be similar to how the port was put in.  · If you are in the hospital or clinic:  · Your port will be taken care of and flushed by a nurse.  · If you are at home:  · A home health care nurse may give medicines and take care of the port.  · You or a family member can get special training and directions for giving medicine and taking care of the port at home.  SEEK IMMEDIATE MEDICAL CARE IF:   · Your port does not flush or you are unable to get a blood return.  · New drainage or pus is coming from the incision.  · A bad smell is coming from the incision site.  · You develop swelling or increased redness at the incision site.  · You develop increased swelling or pain at the port site.  · You develop swelling or pain in the surrounding skin near the port.  · You have an oral temperature above 102° F (38.9° C), not controlled by medicine.  MAKE SURE YOU:   · Understand these instructions.  · Will watch your condition.  · Will get help right away if you are not doing well or get worse.  Document Released: 05/26/2005 Document Revised: 08/18/2011 Document Reviewed: 08/17/2008  [employer]® Patient Information ©2014 [employer].

## 2013-03-04 ENCOUNTER — Encounter (HOSPITAL_COMMUNITY): Payer: Self-pay | Admitting: Pharmacy Technician

## 2013-03-07 ENCOUNTER — Encounter (HOSPITAL_COMMUNITY)
Admission: RE | Admit: 2013-03-07 | Discharge: 2013-03-07 | Disposition: A | Discharge status: Home / Self Care | Payer: Medicare Other | Source: Ambulatory Visit | Attending: General Surgery | Admitting: General Surgery

## 2013-03-07 ENCOUNTER — Other Ambulatory Visit (HOSPITAL_COMMUNITY): Payer: Self-pay

## 2013-03-07 ENCOUNTER — Encounter (HOSPITAL_COMMUNITY): Payer: Self-pay

## 2013-03-07 HISTORY — DX: Drug-induced polyneuropathy: G62.0

## 2013-03-07 HISTORY — DX: Shortness of breath: R06.02

## 2013-03-07 HISTORY — DX: Drug-induced polyneuropathy: T45.1X5A

## 2013-03-07 LAB — SURGICAL PCR SCREEN: MRSA, PCR: POSITIVE — AB

## 2013-03-07 NOTE — Progress Notes (Signed)
Pt denies SOB, chest pain, and being under the care of a cardiologist. Pt denies having a stress test, echo, and cardiac cath. Pt chart left for [place], [place] to review EKG.

## 2013-03-07 NOTE — Progress Notes (Signed)
Pt denies SOB, chest pain, and being under the care of a cardiologist. Pt denies having a stress test, echo, and cardiac cath. Spoke with Revonda Standard, PA (anesthesia)  regarding pt abnormal thyroid lab results; no new orders given.

## 2013-03-08 ENCOUNTER — Encounter: Payer: Self-pay | Admitting: *Deleted

## 2013-03-08 ENCOUNTER — Ambulatory Visit (HOSPITAL_BASED_OUTPATIENT_CLINIC_OR_DEPARTMENT_OTHER): Payer: Medicare Other | Admitting: Lab

## 2013-03-08 ENCOUNTER — Encounter: Payer: Self-pay | Admitting: Hematology and Oncology

## 2013-03-08 ENCOUNTER — Other Ambulatory Visit (INDEPENDENT_AMBULATORY_CARE_PROVIDER_SITE_OTHER): Payer: Self-pay | Admitting: *Deleted

## 2013-03-08 ENCOUNTER — Ambulatory Visit (HOSPITAL_BASED_OUTPATIENT_CLINIC_OR_DEPARTMENT_OTHER): Payer: Medicare Other | Admitting: Hematology and Oncology

## 2013-03-08 ENCOUNTER — Telehealth: Payer: Self-pay | Admitting: Hematology and Oncology

## 2013-03-08 VITALS — BP 142/62 | HR 66 | Temp 98.5°F | Resp 18 | Ht 69.0 in | Wt 126.8 lb

## 2013-03-08 DIAGNOSIS — C7951 Secondary malignant neoplasm of bone: Secondary | ICD-10-CM

## 2013-03-08 DIAGNOSIS — C321 Malignant neoplasm of supraglottis: Secondary | ICD-10-CM

## 2013-03-08 DIAGNOSIS — E46 Unspecified protein-calorie malnutrition: Secondary | ICD-10-CM

## 2013-03-08 LAB — CBC WITH DIFFERENTIAL/PLATELET
Eosinophils Absolute: 0.1 10*3/uL (ref 0.0–0.5)
MCHC: 34.1 g/dL (ref 32.0–36.0)
MCV: 94 fL (ref 79.3–98.0)
NEUT#: 3 10*3/uL (ref 1.5–6.5)
RBC: 3.5 10*6/uL — ABNORMAL LOW (ref 4.20–5.82)
RDW: 15.1 % — ABNORMAL HIGH (ref 11.0–14.6)

## 2013-03-08 LAB — COMPREHENSIVE METABOLIC PANEL (CC13)
BUN: 19.4 mg/dL (ref 7.0–26.0)
CO2: 28 mEq/L (ref 22–29)
Calcium: 9.5 mg/dL (ref 8.4–10.4)
Chloride: 98 mEq/L (ref 98–109)
Creatinine: 1.4 mg/dL — ABNORMAL HIGH (ref 0.7–1.3)

## 2013-03-08 MED ORDER — CEFAZOLIN SODIUM-DEXTROSE 2-3 GM-% IV SOLR
2.0000 g | INTRAVENOUS | Status: AC
  Administered 2013-03-09: 2 g via INTRAVENOUS
  Filled 2013-03-08: qty 50

## 2013-03-08 MED ORDER — MUPIROCIN CALCIUM 2 % NA OINT: TOPICAL_OINTMENT | Freq: Two times a day (BID) | NASAL | Status: DC

## 2013-03-08 NOTE — Progress Notes (Signed)
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 After a multidisciplinary discussion, it was decided the patient to proceed with surgery followed by adjuvant treatment. In January 2014 initial biopsy of the right supraglottic region confirmed squamous cell carcinoma On 08/13/2012 he underwent laryngectomy and bilateral lymph node dissection. All margins were negative. There were bilateral lymph node involvement, and 10/25 lymph nodes were positive for extracapsular extension. Between 09/20/2012 to 10/29/2012 he received concurrent chemoradiation therapy with weekly cisplatin. September 2014: PET/CT scan showed multi focal hypermetabolic bone metastases.  INTERVAL HISTORY: Cody [name] 67 y.o. male returns for further followup. Since the last time I saw him, he accepted the fact that he now has stage IV diffuse metastatic cancer to the bone. His Infuse-a-Port placement is scheduled for tomorrow. He is doing well with no new adenopathy. He's gained some weight since the last time I saw him.  I have reviewed the past medical history, past surgical history, social history and family history with the patient and they are unchanged from previous note.  ALLERGIES:  has No Known Allergies.  MEDICATIONS: Current outpatient prescriptions:levothyroxine (SYNTHROID, LEVOTHROID) 25 MCG tablet, Take 25 mcg by mouth daily before breakfast., Disp: , Rfl: ;  Multiple Vitamin (MULTIVITAMIN) tablet, Take 1 tablet by mouth daily., Disp: , Rfl: ;  polyethylene glycol (MIRALAX / GLYCOLAX) packet, Take 17 g by mouth daily as needed (severe constipation.)., Disp: , Rfl:  acetaminophen (TYLENOL) 325 MG tablet, Take 650 mg by mouth daily as  needed for pain., Disp: , Rfl:  No current facility-administered medications for this visit. Facility-Administered Medications Ordered in Other Visits: [START ON 03/09/2013] ceFAZolin (ANCEF) IVPB 2 g/50 mL premix, 2 g, Intravenous, On Call to OR, Mariella Saa, MD  REVIEW OF SYSTEMS:   Constitutional: Denies fevers, chills or abnormal weight loss Eyes: Denies blurriness of vision Ears, nose, mouth, throat, and face: Denies mucositis or sore throat Respiratory: Denies cough, dyspnea or wheezes Cardiovascular: Denies palpitation, chest discomfort or lower extremity swelling Gastrointestinal:  Denies nausea, heartburn or change in bowel habits Skin: Denies abnormal skin rashes Lymphatics: Denies new lymphadenopathy or easy bruising Neurological:Denies numbness, tingling or new weaknesses Behavioral/Psych: Mood is stable, no new changes  All other systems were reviewed with the patient and are negative.  PHYSICAL EXAMINATION: ECOG PERFORMANCE STATUS: 0 - Asymptomatic  Filed Vitals:   03/08/13 1455  BP: 142/62  Pulse: 66  Temp: 98.5 F (36.9 C)  Resp: 18   Filed Weights   03/08/13 1455  Weight: 126 lb 12.8 oz (57.516 kg)    GENERAL:alert, no distress and comfortable OROPHARYNX:no exudate, no erythema and lips, buccal mucosa, and tongue normal  NECK: supple, thyroid normal size, non-tender, without nodularity tracheostomy site in situ with no drainage LYMPH:  no palpable lymphadenopathy in the cervical, axillary or inguinal  LABORATORY DATA:  I have reviewed the data as listed    Component Value Date/Time   NA 133* 02/23/2013 0820   NA 136 08/19/2012 0625   K 4.4 02/23/2013 0820   K 4.3 08/19/2012 0625   CL 98 11/15/2012 0941   CL 100 08/19/2012 0625   CO2 25 02/23/2013 0820   CO2 28 08/19/2012 0625   GLUCOSE 94 02/23/2013 0820   GLUCOSE 90  11/15/2012 0941   GLUCOSE 109* 08/19/2012 0625   BUN 16.9 02/23/2013 0820   BUN 9 08/19/2012 0625   CREATININE 1.2 02/23/2013 0820    CREATININE 0.69 08/19/2012 0625   CALCIUM 9.7 02/23/2013 0820   CALCIUM 8.3* 08/20/2012 0542   PROT 9.3* 02/23/2013 0820   PROT 6.9 08/19/2012 0625   ALBUMIN 3.7 02/23/2013 0820   ALBUMIN 2.4* 08/19/2012 0625   AST 22 02/23/2013 0820   AST 25 08/19/2012 0625   ALT 14 02/23/2013 0820   ALT 20 08/19/2012 0625   ALKPHOS 67 02/23/2013 0820   ALKPHOS 57 08/19/2012 0625   BILITOT 0.49 02/23/2013 0820   BILITOT 0.6 08/19/2012 0625   GFRNONAA >90 08/19/2012 0625   GFRAA >90 08/19/2012 0625    I No results found for this basename: SPEP, UPEP,  kappa and lambda light chains    Lab Results  Component Value Date   WBC 5.2 02/23/2013   NEUTROABS 3.5 02/23/2013   HGB 11.8* 02/23/2013   HCT 34.7* 02/23/2013   MCV 93.0 02/23/2013   PLT 199 02/23/2013      Chemistry      Component Value Date/Time   NA 133* 02/23/2013 0820   NA 136 08/19/2012 0625   K 4.4 02/23/2013 0820   K 4.3 08/19/2012 0625   CL 98 11/15/2012 0941   CL 100 08/19/2012 0625   CO2 25 02/23/2013 0820   CO2 28 08/19/2012 0625   BUN 16.9 02/23/2013 0820   BUN 9 08/19/2012 0625   CREATININE 1.2 02/23/2013 0820   CREATININE 0.69 08/19/2012 0625      Component Value Date/Time   CALCIUM 9.7 02/23/2013 0820   CALCIUM 8.3* 08/20/2012 0542   ALKPHOS 67 02/23/2013 0820   ALKPHOS 57 08/19/2012 0625   AST 22 02/23/2013 0820   AST 25 08/19/2012 0625   ALT 14 02/23/2013 0820   ALT 20 08/19/2012 0625   BILITOT 0.49 02/23/2013 0820   BILITOT 0.6 08/19/2012 0625     ASSESSMENT: 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 Palliative chemotherapy is non-curative. We discussed about previous treatment option using either single agent, combination chemotherapy, or triple therapy. We discussed the risks, benefits, side effects of chemotherapy. After long discussion, the patient elected for  weekly treatment and I would recommend using combination with carboplatin and Taxol weekly. Some of the side effects we discussed including risk of pancytopenia, infection, need for blood transfusion, peripheral neuropathy, and allergic reaction. He is in agreement to proceed. We elected to keep his appointment on the weekly schedule every Tuesday to facilitate transportation since his son is also on every Tuesday. I will proceed with repeating some baseline blood work today. I will see him back in 2 weeks to assess toxicity. We will start treatment next week. Given his recent treatment to his oral pharynx I will hold off prescribing intravenous bisphosphonate treatment until after his appointment with the dentist next week. If there is no contraindication to proceed, I would like intravenous Zometa in the future. #3 malnutrition He will continue using his feeding tube. The patient has been gaining weight recently.  All questions were answered. The patient knows to call the clinic with any problems, questions or concerns. We can certainly see the patient much sooner if necessary. No barriers to learning was detected.  [name], Kawika Bischoff, MD 03/08/2013 3:26 PM

## 2013-03-08 NOTE — Progress Notes (Signed)
To provide support, met with pt and his son during scheduled visit with Dr. Bertis Ruddy who discussed options for chemotherapy.  Will continue to navigate as L2 patient.  Young Berry, RN, BSN, [facility] Navigator [phone]

## 2013-03-08 NOTE — Telephone Encounter (Signed)
gv and printed appt sched and avs for pt for OCT...sent pt back to the lab °

## 2013-03-08 NOTE — Progress Notes (Signed)
Anesthesia Chart Review:  Patient is a 67 year old male scheduled for placement of a Port-a-cath on 03/09/13. History includes stage IV SCC of the supraglottis s/p surgical resection/laryngectomy 08/13/12 with chemoradiation and flap reconstruction using the right pectoralis muscle.  He has now developed bone mets, and palliative chemotherapy has been recommended. 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  EKG on 08/13/12 showed NSR, left BBB. EKG from [facility] from 05/29/10 also showed left BBB.  2V CXR on 07/08/12 showed mild hyperinflation configuration. No acute or active superimposed process. Stable chronic findings are described above. 1V CXR on 07/08/12 showed no active disease.  Labs from 02/23/13 noted.  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  He has tolerated three prior procedures in the OR since 06/2012.  If no acute changes then I would anticipate that he could proceed as planned.  Velna [facility]/Anesthesiology Phone [phone] 03/08/2013 9:38 AM

## 2013-03-09 ENCOUNTER — Encounter (HOSPITAL_COMMUNITY): Payer: Self-pay | Admitting: Vascular Surgery

## 2013-03-09 ENCOUNTER — Encounter (HOSPITAL_COMMUNITY)
Admission: RE | Disposition: A | Discharge status: Home / Self Care | Payer: Self-pay | Source: Ambulatory Visit | Attending: General Surgery

## 2013-03-09 ENCOUNTER — Ambulatory Visit (HOSPITAL_COMMUNITY): Payer: Medicare Other | Admitting: Anesthesiology

## 2013-03-09 ENCOUNTER — Ambulatory Visit (HOSPITAL_COMMUNITY): Payer: Medicare Other

## 2013-03-09 ENCOUNTER — Encounter (HOSPITAL_COMMUNITY): Payer: Self-pay | Admitting: Anesthesiology

## 2013-03-09 ENCOUNTER — Ambulatory Visit (HOSPITAL_COMMUNITY)
Admission: RE | Admit: 2013-03-09 | Discharge: 2013-03-09 | Disposition: A | Discharge status: Home / Self Care | Payer: Medicare Other | Source: Ambulatory Visit | Attending: General Surgery | Admitting: General Surgery

## 2013-03-09 DIAGNOSIS — C321 Malignant neoplasm of supraglottis: Secondary | ICD-10-CM

## 2013-03-09 DIAGNOSIS — C7951 Secondary malignant neoplasm of bone: Secondary | ICD-10-CM | POA: Insufficient documentation

## 2013-03-09 HISTORY — PX: PORTACATH PLACEMENT: SHX2246

## 2013-03-09 SURGERY — INSERTION, TUNNELED CENTRAL VENOUS DEVICE, WITH PORT
Anesthesia: Monitor Anesthesia Care | Site: Chest | Wound class: Clean

## 2013-03-09 MED ORDER — HEPARIN SOD (PORK) LOCK FLUSH 100 UNIT/ML IV SOLN
INTRAVENOUS | Status: DC | PRN
  Administered 2013-03-09: 500 [IU] via INTRAVENOUS

## 2013-03-09 MED ORDER — SODIUM CHLORIDE 0.9 % IR SOLN
Status: DC | PRN
  Administered 2013-03-09: 09:00:00

## 2013-03-09 MED ORDER — OXYCODONE HCL 5 MG PO TABS: 5.0000 mg | ORAL_TABLET | Freq: Once | ORAL | Status: DC | PRN

## 2013-03-09 MED ORDER — FENTANYL CITRATE 0.05 MG/ML IJ SOLN
INTRAMUSCULAR | Status: DC | PRN
  Administered 2013-03-09 (×4): 50 ug via INTRAVENOUS

## 2013-03-09 MED ORDER — OXYCODONE HCL 5 MG/5ML PO SOLN: 5.0000 mg | Freq: Once | ORAL | Status: DC | PRN

## 2013-03-09 MED ORDER — LIDOCAINE HCL (PF) 1 % IJ SOLN
INTRAMUSCULAR | Status: AC
  Filled 2013-03-09: qty 30

## 2013-03-09 MED ORDER — BUPIVACAINE-EPINEPHRINE PF 0.25-1:200000 % IJ SOLN
INTRAMUSCULAR | Status: AC
  Filled 2013-03-09: qty 30

## 2013-03-09 MED ORDER — HYDROCODONE-ACETAMINOPHEN 5-325 MG PO TABS: 1.0000 | ORAL_TABLET | ORAL | Status: DC | PRN

## 2013-03-09 MED ORDER — MIDAZOLAM HCL 5 MG/5ML IJ SOLN
INTRAMUSCULAR | Status: DC | PRN
  Administered 2013-03-09 (×2): 1 mg via INTRAVENOUS

## 2013-03-09 MED ORDER — HEPARIN SOD (PORK) LOCK FLUSH 100 UNIT/ML IV SOLN
INTRAVENOUS | Status: AC
  Filled 2013-03-09: qty 5

## 2013-03-09 MED ORDER — SODIUM BICARBONATE 4 % IV SOLN
INTRAVENOUS | Status: DC | PRN
  Administered 2013-03-09: 09:00:00

## 2013-03-09 MED ORDER — FENTANYL CITRATE 0.05 MG/ML IJ SOLN: 25.0000 ug | INTRAMUSCULAR | Status: DC | PRN

## 2013-03-09 MED ORDER — PROPOFOL 10 MG/ML IV BOLUS
INTRAVENOUS | Status: DC | PRN
  Administered 2013-03-09 (×2): 20 mg via INTRAVENOUS

## 2013-03-09 MED ORDER — CHLORHEXIDINE GLUCONATE 4 % EX LIQD: 1.0000 "application " | Freq: Once | CUTANEOUS | Status: DC

## 2013-03-09 MED ORDER — MIDAZOLAM HCL 2 MG/2ML IJ SOLN: 1.0000 mg | INTRAMUSCULAR | Status: DC | PRN

## 2013-03-09 MED ORDER — LACTATED RINGERS IV SOLN
INTRAVENOUS | Status: DC | PRN
  Administered 2013-03-09: 08:00:00 via INTRAVENOUS

## 2013-03-09 MED ORDER — FENTANYL CITRATE 0.05 MG/ML IJ SOLN: 50.0000 ug | Freq: Once | INTRAMUSCULAR | Status: DC

## 2013-03-09 MED ORDER — LIDOCAINE HCL (CARDIAC) 10 MG/ML IV SOLN
INTRAVENOUS | Status: DC | PRN
  Administered 2013-03-09: 60 mg via INTRAVENOUS

## 2013-03-09 MED ORDER — SODIUM BICARBONATE 4 % IV SOLN
INTRAVENOUS | Status: AC
  Filled 2013-03-09: qty 5

## 2013-03-09 SURGICAL SUPPLY — 60 items
ADH SKN CLS APL DERMABOND .7 (GAUZE/BANDAGES/DRESSINGS) ×1
BAG DECANTER FOR FLEXI CONT (MISCELLANEOUS) ×2 IMPLANT
BLADE SURG 15 STRL LF DISP TIS (BLADE) ×1 IMPLANT
BLADE SURG 15 STRL SS (BLADE) ×2
CHLORAPREP W/TINT 10.5 ML (MISCELLANEOUS) ×2 IMPLANT
CLOTH BEACON ORANGE TIMEOUT ST (SAFETY) ×2 IMPLANT
COVER SURGICAL LIGHT HANDLE (MISCELLANEOUS) ×2 IMPLANT
COVER TRANSDUCER ULTRASND GEL (DRAPE) ×1 IMPLANT
CRADLE DONUT ADULT HEAD (MISCELLANEOUS) ×2 IMPLANT
DECANTER SPIKE VIAL GLASS SM (MISCELLANEOUS) IMPLANT
DERMABOND ADVANCED (GAUZE/BANDAGES/DRESSINGS) ×1
DERMABOND ADVANCED .7 DNX12 (GAUZE/BANDAGES/DRESSINGS) ×1 IMPLANT
DRAPE C-ARM 42X72 X-RAY (DRAPES) ×2 IMPLANT
DRAPE CHEST BREAST 15X10 FENES (DRAPES) ×2 IMPLANT
DRAPE UTILITY 15X26 W/TAPE STR (DRAPE) ×4 IMPLANT
ELECT CAUTERY BLADE 6.4 (BLADE) ×2 IMPLANT
ELECT REM PT RETURN 9FT ADLT (ELECTROSURGICAL) ×2
ELECTRODE REM PT RTRN 9FT ADLT (ELECTROSURGICAL) ×1 IMPLANT
GAUZE SPONGE 4X4 16PLY XRAY LF (GAUZE/BANDAGES/DRESSINGS) ×2 IMPLANT
GLOVE BIO SURGEON STRL SZ7.5 (GLOVE) ×1 IMPLANT
GLOVE BIOGEL PI IND STRL 6.5 (GLOVE) IMPLANT
GLOVE BIOGEL PI IND STRL 7.5 (GLOVE) IMPLANT
GLOVE BIOGEL PI IND STRL 8 (GLOVE) ×1 IMPLANT
GLOVE BIOGEL PI INDICATOR 6.5 (GLOVE) ×1
GLOVE BIOGEL PI INDICATOR 7.5 (GLOVE) ×1
GLOVE BIOGEL PI INDICATOR 8 (GLOVE) ×1
GLOVE SS BIOGEL STRL SZ 7.5 (GLOVE) ×1 IMPLANT
GLOVE SUPERSENSE BIOGEL SZ 7.5 (GLOVE) ×1
GLOVE SURG SS PI 6.5 STRL IVOR (GLOVE) ×1 IMPLANT
GOWN STRL NON-REIN LRG LVL3 (GOWN DISPOSABLE) ×2 IMPLANT
GOWN STRL REIN XL XLG (GOWN DISPOSABLE) ×2 IMPLANT
INTRODUCER COOK 11FR (CATHETERS) IMPLANT
IV CATH 14GX2 1/4 (CATHETERS) IMPLANT
KIT BASIN OR (CUSTOM PROCEDURE TRAY) ×2 IMPLANT
KIT PORT POWER 8FR ISP CVUE (Catheter) ×1 IMPLANT
KIT PORT POWER 9.6FR MRI PREA (Catheter) IMPLANT
KIT PORT POWER ISP 8FR (Catheter) IMPLANT
KIT POWER CATH 8FR (Catheter) IMPLANT
KIT ROOM TURNOVER OR (KITS) ×2 IMPLANT
NDL HYPO 25GX1X1/2 BEV (NEEDLE) ×1 IMPLANT
NEEDLE 22X1 1/2 (OR ONLY) (NEEDLE) ×2 IMPLANT
NEEDLE HYPO 25GX1X1/2 BEV (NEEDLE) ×2 IMPLANT
NS IRRIG 1000ML POUR BTL (IV SOLUTION) ×2 IMPLANT
PACK SURGICAL SETUP 50X90 (CUSTOM PROCEDURE TRAY) ×2 IMPLANT
PAD ARMBOARD 7.5X6 YLW CONV (MISCELLANEOUS) ×4 IMPLANT
PENCIL BUTTON HOLSTER BLD 10FT (ELECTRODE) ×2 IMPLANT
SET INTRODUCER 12FR PACEMAKER (SHEATH) IMPLANT
SET SHEATH INTRODUCER 10FR (MISCELLANEOUS) IMPLANT
SHEATH COOK PEEL AWAY SET 9F (SHEATH) IMPLANT
SUT MON AB 5-0 PS2 18 (SUTURE) ×2 IMPLANT
SUT PROLENE 2 0 SH DA (SUTURE) ×3 IMPLANT
SUT SILK 2 0 (SUTURE)
SUT SILK 2-0 18XBRD TIE 12 (SUTURE) IMPLANT
SYR 20ML ECCENTRIC (SYRINGE) ×4 IMPLANT
SYR 5ML LUER SLIP (SYRINGE) ×2 IMPLANT
SYR BULB 3OZ (MISCELLANEOUS) ×2 IMPLANT
SYR CONTROL 10ML LL (SYRINGE) ×2 IMPLANT
TOWEL OR 17X24 6PK STRL BLUE (TOWEL DISPOSABLE) ×2 IMPLANT
TOWEL OR 17X26 10 PK STRL BLUE (TOWEL DISPOSABLE) ×2 IMPLANT
WATER STERILE IRR 1000ML POUR (IV SOLUTION) IMPLANT

## 2013-03-09 NOTE — Anesthesia Postprocedure Evaluation (Signed)
  Anesthesia Post-op Note  Patient: Cody [name]  Procedure(s) Performed: Procedure(s): INSERTION PORT-A-CATH (N/A)  Patient Location: PACU  Anesthesia Type:MAC  Level of Consciousness: awake and alert   Airway and Oxygen Therapy: Patient Spontanous Breathing  Post-op Pain: none  Post-op Assessment: Post-op Vital signs reviewed, Patient's Cardiovascular Status Stable, Respiratory Function Stable, Patent Airway, No signs of Nausea or vomiting and Pain level controlled  Post-op Vital Signs: Reviewed and stable  Complications: No apparent anesthesia complications

## 2013-03-09 NOTE — Transfer of Care (Signed)
Immediate Anesthesia Transfer of Care Note  Patient: Cody [name]  Procedure(s) Performed: Procedure(s): INSERTION PORT-A-CATH (N/A)  Patient Location: PACU  Anesthesia Type:MAC  Level of Consciousness: awake, alert , oriented and patient cooperative  Airway & Oxygen Therapy: Patient Spontanous Breathing  Post-op Assessment: Report given to PACU RN, Post -op Vital signs reviewed and stable and Patient moving all extremities  Post vital signs: Reviewed and stable  Complications: No apparent anesthesia complications

## 2013-03-09 NOTE — Interval H&P Note (Signed)
History and Physical Interval Note:  03/09/2013 8:02 AM  Cody [name]  has presented today for surgery, with the diagnosis of Cancer supraglottis stage IV  The various methods of treatment have been discussed with the patient and family. After consideration of risks, benefits and other options for treatment, the patient has consented to  Procedure(s): INSERTION PORT-A-CATH (N/A) as a surgical intervention .  The patient's history has been reviewed, patient examined, no change in status, stable for surgery.  I have reviewed the patient's chart and labs.  Questions were answered to the patient's satisfaction.     Nargis Abrams T

## 2013-03-09 NOTE — Interval H&P Note (Signed)
History and Physical Interval Note:  03/09/2013 8:01 AM  Cody [name]  has presented today for surgery, with the diagnosis of Cancer supraglottis stage IV  The various methods of treatment have been discussed with the patient and family. After consideration of risks, benefits and other options for treatment, the patient has consented to  Procedure(s): INSERTION PORT-A-CATH (N/A) as a surgical intervention .  The patient's history has been reviewed, patient examined, no change in status, stable for surgery.  I have reviewed the patient's chart and labs.  Questions were answered to the patient's satisfaction.     Breon Diss T

## 2013-03-09 NOTE — Op Note (Signed)
Preoperative diagnosis: Metastatic SCC  Postoperative diagnosis: Same  Procedure: Placement of ClearVue subcutaneous venous port  Surgeon: Glenna Fellows M.D.  Anesthesia: Local with IV sedation  Description of procedure: Patient is brought to the operating room and placed in the supine position on the operating table. IV sedation was administered. The entire upper chest and neck were widely sterilely prepped and draped. Local anesthesia was used to infiltrate the insertion of port site. The left subclavian vein was cannulated with a needle and guidewire without difficulty and position in the superior vena cava was confirmed by fluoroscopy. The introducer was then placed over the guidewire and the flushed catheter placed via the introducer which was stripped away and the tip of the catheter positioned near the cavoatrial junction. A small transverse incision was made in the anterior chest wall and subcutaneous pocket created. The catheter was tunneled into the pocket, trimmed to length, and attached to the flushed port which was positioned in the pocket. The port was sutured to the chest wall with interrupted 2-0 Prolene. The incisions were closed with subcutaneous interrupted Monocryl and the skin incisions closed with subcuticular Monocryl and Dermabond. The port was accessed and flushed and aspirated easily and was left flushed with concentrated heparin solution. Sponge, needle and instrument counts were correct. The patient was taken to recovery in good condition.  Milly Goggins T  03/09/2013

## 2013-03-09 NOTE — Anesthesia Preprocedure Evaluation (Signed)
Anesthesia Evaluation  Patient identified by MRN, date of birth, ID band Patient awake    Reviewed: Allergy & Precautions, H&P , NPO status , Patient's Chart, lab work & pertinent test results, reviewed documented beta blocker date and time   Airway Mallampati: II TM Distance: <3 FB Neck ROM: Limited    Dental  (+) Poor Dentition   Pulmonary COPDformer smoker,  TB hx + rhonchi         Cardiovascular Rhythm:Regular Rate:Normal     Neuro/Psych Learning disability   GI/Hepatic   Endo/Other    Renal/GU      Musculoskeletal   Abdominal   Peds  Hematology   Anesthesia Other Findings Ca pharynx Trach cachectic  Reproductive/Obstetrics                           Anesthesia Physical Anesthesia Plan  ASA: IV  Anesthesia Plan: MAC   Post-op Pain Management:    Induction: Intravenous  Airway Management Planned: Simple Face Mask  Additional Equipment:   Intra-op Plan:   Post-operative Plan:   Informed Consent: I have reviewed the patients History and Physical, chart, labs and discussed the procedure including the risks, benefits and alternatives for the proposed anesthesia with the patient or authorized representative who has indicated his/her understanding and acceptance.     Plan Discussed with: CRNA and Surgeon  Anesthesia Plan Comments:         Anesthesia Quick Evaluation

## 2013-03-09 NOTE — H&P (View-Only) (Signed)
Subjective:   Cancer, need for Port-A-Cath  Patient ID: Cody [name], male   DOB: 1945/07/25, 67 y.o.   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 He has a history of surgical resection with radiation and chemotherapy and flap reconstruction using the right pectoralis muscle. He unfortunately has been recently found to have multiple bone metastases which currently are asymptomatic. Palliative chemotherapy has been recommended and I was asked to see the patient for Port-A-Cath placement. He currently denies pain or shortness of breath.  Past Medical History  Diagnosis Date  . TB (pulmonary tuberculosis) 1998  . Learning disabilities   . Cough   . Difficulty swallowing   . Pain on swallowing   . Abnormal weight loss documented 07/07/12     20 lbs  . Referred otalgia     Right Ear  . 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 Arthritis   . 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 S/P radiation therapy 09/20/12 - 10/29/12    Tumor bed/stoma/bilateral neck/60 Gy / 30 Fractions  . Status post chemotherapy     adjuvant chemoradiation 09/20/12 through 10/29/2012. Received chemotherapy with weekly Cisplatin.    . Thyroid disease    Past Surgical History  Procedure Laterality Date  . Colonoscopy  2006    neg.   . Direct laryngoscopy  07/09/11    Epiglottic mass  . Direct laryngoscopy  07/08/2012    Procedure: DIRECT LARYNGOSCOPY;  Surgeon: Serena Colonel, MD;  Location: [facility] OR;  Service: ENT;  Laterality: N/A;  . Esophagoscopy  07/08/2012    Procedure: ESOPHAGOSCOPY;  Surgeon: Serena Colonel, MD;  Location: [facility] OR;  Service: ENT;  Laterality: N/A;  . Radical neck dissection Bilateral 08/13/2012    Procedure: RADICAL NECK DISSECTION;  Surgeon: Serena Colonel, MD;  Location: [facility] OR;  Service: ENT;  Laterality: Bilateral;  . Tracheostomy tube placement N/A 08/13/2012   Procedure: TRACHEOSTOMY;  Surgeon: Serena Colonel, MD;  Location: [facility] OR;  Service: ENT;  Laterality: N/A;  . Pectoralis flap N/A 08/13/2012    Procedure: RIGHT PECTORALIS MYOCUTANEOUS FLAP to LARYNX;  Surgeon: Etter Sjogren, MD;  Location: [facility] OR;  Service: Plastics;  Laterality: N/A;  . Multiple extractions with alveoloplasty N/A 08/19/2012    Procedure: Extraction of tooth #'s 1,2,3,4,5,6,10,11,12,13,14,16,17,18,19,21,22,23, 24,25,26,27,28,29,30,31,32 with alveoloplasty and bilateral mandibular buccal exostoses reductions;  Surgeon: Charlynne Pander, DDS;  Location: [facility] OR;  Service: Oral Surgery;  Laterality: N/A;  . Laryngoscopy N/A 08/19/2012    Procedure: LARYNGOSCOPY;  Surgeon: Serena Colonel, MD;  Location: [facility] OR;  Service: ENT;  Laterality: N/A;  Anesthesia's Laryngoscope   Current Outpatient Prescriptions  Medication Sig Dispense Refill  . Alum & Mag Hydroxide-Simeth (MAGIC MOUTHWASH W/LIDOCAINE) SOLN 1part nystatin,1part Maaloxplus,1part benadryl,3part 2%viscous lidocaine. Swish/swallow 10 mL up to QID, before meals/bedtime  480 mL  5  . levothyroxine (SYNTHROID, LEVOTHROID) 25 MCG tablet Take 1 tablet (25 mcg total) by mouth daily before breakfast.  30 tablet  1  . polyethylene glycol (MIRALAX) packet Take 17 g by mouth every other day as needed (severe constipation.).  14 each  5   No current facility-administered medications for this visit.     Review of Systems  Respiratory: Negative.   Cardiovascular: Negative.        Objective:   Physical Exam BP 110/80  Pulse  88  Temp(Src) 96.2 F (35.7 C) (Temporal)  Resp 14  Ht 5\' 11"  (1.803 m)  Wt 127 lb 9.3 oz (57.87 kg)  BMI 17.8 kg/m2 General: Very thin Afro-American male in no distress Skin: No rash or infection HEENT: Well-healed tracheostomy. Status post right-sided neck dissection with pectoralis flap in the right neck over lying the right clavicle. Left neck feels firm but no definite mass or adenopathy. Chest: Breath  sounds clear and equal. No increased work of breathing. Left infraclavicular area normal to exam. Cardiovascular: Regular rate and rhythm. No edema Abdomen: Soft nontender Extremities: No edema    Assessment:     Stage IV squamous cancer of the supraglottis requiring long-term IV access and Port-A-Cath placement has been requested. His options for placement or limited due to his previous surgery and radiation and I don't think there is any reasonable locations except for the left subclavian vein. I discussed Port-A-Cath placement with the patient and his daughter. We will plan to do this under sedation and local anesthesia. We discussed that with our only option being the left subclavian vein that if this vein is difficult to locate I may not be able to place the catheter. We discussed the nature of the procedure and risks of bleeding, infection, pneumothorax and long-term risk for thrombosis, occlusion, displacement and infection. They understand and agree to proceed we will get this scheduled as soon as possible.    Plan:     Port-A-Cath placement under local anesthesia with sedation in the left subclavian position.

## 2013-03-10 ENCOUNTER — Telehealth: Payer: Self-pay | Admitting: Hematology and Oncology

## 2013-03-10 ENCOUNTER — Telehealth: Payer: Self-pay | Admitting: *Deleted

## 2013-03-10 NOTE — Telephone Encounter (Signed)
Per staff message and POF I have scheduled appts.  JMW  

## 2013-03-10 NOTE — Telephone Encounter (Signed)
, °

## 2013-03-11 ENCOUNTER — Encounter (HOSPITAL_COMMUNITY): Payer: Self-pay | Admitting: General Surgery

## 2013-03-14 ENCOUNTER — Ambulatory Visit (HOSPITAL_COMMUNITY): Payer: Self-pay | Admitting: Dentistry

## 2013-03-14 ENCOUNTER — Encounter (HOSPITAL_COMMUNITY): Payer: Self-pay | Admitting: Dentistry

## 2013-03-14 VITALS — BP 127/78 | HR 68 | Temp 98.3°F

## 2013-03-14 DIAGNOSIS — Z463 Encounter for fitting and adjustment of dental prosthetic device: Secondary | ICD-10-CM

## 2013-03-14 DIAGNOSIS — K08109 Complete loss of teeth, unspecified cause, unspecified class: Secondary | ICD-10-CM

## 2013-03-14 NOTE — Patient Instructions (Signed)
Instructions for Denture Use and Care  Congratulations, you are on the way to oral rehabilitation!  You have just received a new set of complete or partial dentures.  These prostheses will help to improve both your appearance and chewing ability.  These instructions will help you get adjusted to your dentures as well as care for them properly.  Please read these instructions carefully and completely as soon as you get home.  If you or your caregiver have any questions please notify the [facility] at [phone].  HOW YOUR DENTURES LOOK AND FEEL Soon after you begin wearing your dentures, you may feel that your dentures are too large or even loose.  As our mouth and facial muscles become accustomed to the dentures, these feelings will go away.  You also may feel that you are salivating more than you normally do.  This feeling should go away as you get used to having the dentures in your mouth.  You may bite your cheek or your tongue; this will eventually resolve itself as you wear your dentures.  Some soreness is to be expected, but you should not hurt.  If your mouth hurts, call your dentist.  A denture adhesive may occasionally be necessary to hold your dentures in place more securely.  The dentist will let you know when one is recommended for you.  SPEAKING Wearing dentures will change the sound of your voice initially.  This will be noticed by you more than anyone else.  Bite and swallow before you speak, in order to place your dentures in position so that you may speak more clearly.  Practice speaking by reading aloud or counting from 1 to 100 very slowly and distinctly.  After some practice your mouth will become accustomed to your dentures and you will speak more clearly.  EATING Chewing will definitely be different after you receive your dentures.  With a little practice and patience you should be able to eat just about any kind of food.  Begin by eating small quantities of food  that are cut into small pieces.  Star with soft foods such as eggs, cooked vegetables, or puddings.  As you gain confidence advance  Your diet to whatever texture foods you can tolerate.  DENTURE CARE Dentures can collect plaque and calculus much the same as natural teeth can.  If not removed on a regular basis, your dentures will not look or feel clean, and you will experience denture odor.  It is very important that you remove your dentures at bedtime and clean them thoroughly.  You should: 1. Clean your dentures over a sink full of water so if dropped, breakage will be prevented. 2. Rinse your dentures with cool water to remove any large food particles. 3. Use soap and water or a denture cleanser or paste to clean the dentures.  Do not use regular toothpaste as it may abrade the denture base or teeth. 4. Use a moistened denture brush to clean all surfaces (inside and outside). 5. Rinse thoroughly to remove any remaining soap or denture cleanser. 6. Use a soft bristle toothbrush to gently brush any natural teeth, gums, tongue, and palate at bedtime and before reinserting your dentures. 7. Do not sleep with your dentures in your mouth at night.  Remove your dentures and soak them overnight in a denture cup filled with water or denture solution as recommended by your dentist.  This routine will become second nature and will increase the life and comfort   of your dentures.  Please do not try to adjust these dentures yourself; you could damage them.  FOLLOW-UP You should call or make an appointment with your dentist.  Your dentist would like to see you at least once a year for a check-up and examination. 

## 2013-03-14 NOTE — Progress Notes (Signed)
03/14/2013  Patient:            Cody [name] Date of Birth:  April 07, 1946 MRN:                [id number]  BP 127/78  Pulse 68  Temp(Src) 98.3 F (36.8 C) (Oral)   Cody [name] presents for insertion of upper and lower complete dentures. Procedure: Pressure indicating paste applied to dentures. Adjustments made as needed. [place]. Occlusion evaluated and adjustments made as needed for Centric Relation and protrusive strokes. Good esthetics, phonetics, fit, and function noted. Patient accepts results. Post op instructions provided in written and verbal formats on use and care of dentures. Gave patient denture brush and cup. Patient to keep dentures out if sore spots develop. Use salt water rinses as needed to aid healing. Return to clinic as scheduled for denture adjustment.  Call if problems arise before then. Patient dismissed in stable condition.  Charlynne Pander, DDS

## 2013-03-15 ENCOUNTER — Encounter: Payer: Self-pay | Admitting: *Deleted

## 2013-03-15 ENCOUNTER — Other Ambulatory Visit: Payer: Medicare Other

## 2013-03-15 ENCOUNTER — Ambulatory Visit (HOSPITAL_BASED_OUTPATIENT_CLINIC_OR_DEPARTMENT_OTHER): Payer: Medicare Other

## 2013-03-15 ENCOUNTER — Other Ambulatory Visit: Payer: Self-pay | Admitting: Hematology and Oncology

## 2013-03-15 VITALS — BP 136/85 | HR 61 | Temp 97.5°F | Resp 16

## 2013-03-15 DIAGNOSIS — C321 Malignant neoplasm of supraglottis: Secondary | ICD-10-CM

## 2013-03-15 DIAGNOSIS — Z5111 Encounter for antineoplastic chemotherapy: Secondary | ICD-10-CM

## 2013-03-15 MED ORDER — SODIUM CHLORIDE 0.9 % IV SOLN
Freq: Once | INTRAVENOUS | Status: AC
  Administered 2013-03-15: 14:00:00 via INTRAVENOUS

## 2013-03-15 MED ORDER — DIPHENHYDRAMINE HCL 50 MG/ML IJ SOLN
INTRAMUSCULAR | Status: AC
  Filled 2013-03-15: qty 1

## 2013-03-15 MED ORDER — DIPHENHYDRAMINE HCL 50 MG/ML IJ SOLN
50.0000 mg | Freq: Once | INTRAMUSCULAR | Status: AC
  Administered 2013-03-15: 50 mg via INTRAVENOUS

## 2013-03-15 MED ORDER — HEPARIN SOD (PORK) LOCK FLUSH 100 UNIT/ML IV SOLN
500.0000 [IU] | Freq: Once | INTRAVENOUS | Status: AC | PRN
  Administered 2013-03-15: 500 [IU]
  Filled 2013-03-15: qty 5

## 2013-03-15 MED ORDER — PACLITAXEL CHEMO INJECTION 300 MG/50ML
80.0000 mg/m2 | Freq: Once | INTRAVENOUS | Status: AC
  Administered 2013-03-15: 132 mg via INTRAVENOUS
  Filled 2013-03-15: qty 22

## 2013-03-15 MED ORDER — SODIUM CHLORIDE 0.9 % IJ SOLN
10.0000 mL | INTRAMUSCULAR | Status: DC | PRN
  Administered 2013-03-15: 10 mL
  Filled 2013-03-15: qty 10

## 2013-03-15 MED ORDER — ONDANSETRON 16 MG/50ML IVPB (CHCC)
INTRAVENOUS | Status: AC
  Filled 2013-03-15: qty 16

## 2013-03-15 MED ORDER — DEXAMETHASONE SODIUM PHOSPHATE 20 MG/5ML IJ SOLN
20.0000 mg | Freq: Once | INTRAMUSCULAR | Status: AC
  Administered 2013-03-15: 20 mg via INTRAVENOUS

## 2013-03-15 MED ORDER — PROMETHAZINE HCL 25 MG RE SUPP: 25.0000 mg | Freq: Four times a day (QID) | RECTAL | Status: DC | PRN

## 2013-03-15 MED ORDER — FAMOTIDINE IN NACL 20-0.9 MG/50ML-% IV SOLN
20.0000 mg | Freq: Once | INTRAVENOUS | Status: AC
  Administered 2013-03-15: 20 mg via INTRAVENOUS

## 2013-03-15 MED ORDER — DEXAMETHASONE SODIUM PHOSPHATE 20 MG/5ML IJ SOLN
INTRAMUSCULAR | Status: AC
  Filled 2013-03-15: qty 5

## 2013-03-15 MED ORDER — ONDANSETRON HCL 8 MG PO TABS: 8.0000 mg | ORAL_TABLET | Freq: Three times a day (TID) | ORAL | Status: DC | PRN

## 2013-03-15 MED ORDER — CARBOPLATIN CHEMO INJECTION 450 MG/45ML
148.4000 mg | Freq: Once | INTRAVENOUS | Status: AC
  Administered 2013-03-15: 150 mg via INTRAVENOUS
  Filled 2013-03-15: qty 15

## 2013-03-15 MED ORDER — FAMOTIDINE IN NACL 20-0.9 MG/50ML-% IV SOLN
INTRAVENOUS | Status: AC
  Filled 2013-03-15: qty 50

## 2013-03-15 MED ORDER — ONDANSETRON 16 MG/50ML IVPB (CHCC)
16.0000 mg | Freq: Once | INTRAVENOUS | Status: AC
  Administered 2013-03-15: 16 mg via INTRAVENOUS

## 2013-03-15 NOTE — Patient Instructions (Addendum)
[facility] Discharge Instructions for Patients Receiving Chemotherapy  Today you received the following chemotherapy agents taxol. Carboplatin To help prevent nausea and vomiting after your treatment, we encourage you to take your nausea medication  If you develop nausea and vomiting that is not controlled by your nausea medication, call the clinic.   BELOW ARE SYMPTOMS THAT SHOULD BE REPORTED IMMEDIATELY:  *FEVER GREATER THAN 100.5 F  *CHILLS WITH OR WITHOUT FEVER  NAUSEA AND VOMITING THAT IS NOT CONTROLLED WITH YOUR NAUSEA MEDICATION  *UNUSUAL SHORTNESS OF BREATH  *UNUSUAL BRUISING OR BLEEDING  TENDERNESS IN MOUTH AND THROAT WITH OR WITHOUT PRESENCE OF ULCERS  *URINARY PROBLEMS  *BOWEL PROBLEMS  UNUSUAL RASH Items with * indicate a potential emergency and should be followed up as soon as possible.  Feel free to call the clinic you have any questions or concerns. The clinic phone number is [phone].

## 2013-03-16 ENCOUNTER — Telehealth: Payer: Self-pay | Admitting: *Deleted

## 2013-03-16 NOTE — Telephone Encounter (Signed)
Left VM for patient to call office with update on his status/questions regarding his chemo treatment of 03/15/13.

## 2013-03-16 NOTE — Progress Notes (Signed)
Met with patient during scheduled infusion to provide support and encouragement.  Pt indicated he had no concerns.  Will continue to navigate as L1 (new) patient.  Young Berry, RN, BSN, [facility] Head & Neck Oncology Navigator [phone]

## 2013-03-20 ENCOUNTER — Other Ambulatory Visit: Payer: Self-pay | Admitting: Hematology and Oncology

## 2013-03-22 ENCOUNTER — Encounter (HOSPITAL_COMMUNITY): Payer: Self-pay | Admitting: Dentistry

## 2013-03-22 ENCOUNTER — Telehealth: Payer: Self-pay | Admitting: Hematology and Oncology

## 2013-03-22 ENCOUNTER — Encounter: Payer: Self-pay | Admitting: Hematology and Oncology

## 2013-03-22 ENCOUNTER — Encounter: Payer: Self-pay | Admitting: *Deleted

## 2013-03-22 ENCOUNTER — Ambulatory Visit (HOSPITAL_BASED_OUTPATIENT_CLINIC_OR_DEPARTMENT_OTHER): Payer: Medicare Other

## 2013-03-22 ENCOUNTER — Ambulatory Visit (HOSPITAL_BASED_OUTPATIENT_CLINIC_OR_DEPARTMENT_OTHER): Payer: Medicare Other | Admitting: Hematology and Oncology

## 2013-03-22 ENCOUNTER — Ambulatory Visit (HOSPITAL_COMMUNITY): Payer: Self-pay | Admitting: Dentistry

## 2013-03-22 ENCOUNTER — Telehealth: Payer: Self-pay | Admitting: *Deleted

## 2013-03-22 ENCOUNTER — Other Ambulatory Visit (HOSPITAL_BASED_OUTPATIENT_CLINIC_OR_DEPARTMENT_OTHER): Payer: Medicare Other | Admitting: Lab

## 2013-03-22 VITALS — BP 116/69 | HR 73 | Temp 98.3°F

## 2013-03-22 VITALS — BP 118/80 | HR 76 | Temp 97.9°F | Resp 20 | Ht 69.0 in | Wt 126.4 lb

## 2013-03-22 DIAGNOSIS — C7951 Secondary malignant neoplasm of bone: Secondary | ICD-10-CM

## 2013-03-22 DIAGNOSIS — Z5111 Encounter for antineoplastic chemotherapy: Secondary | ICD-10-CM

## 2013-03-22 DIAGNOSIS — D649 Anemia, unspecified: Secondary | ICD-10-CM

## 2013-03-22 DIAGNOSIS — K1370 Unspecified lesions of oral mucosa: Secondary | ICD-10-CM

## 2013-03-22 DIAGNOSIS — Z463 Encounter for fitting and adjustment of dental prosthetic device: Secondary | ICD-10-CM

## 2013-03-22 DIAGNOSIS — K062 Gingival and edentulous alveolar ridge lesions associated with trauma: Secondary | ICD-10-CM

## 2013-03-22 DIAGNOSIS — C321 Malignant neoplasm of supraglottis: Secondary | ICD-10-CM

## 2013-03-22 DIAGNOSIS — G609 Hereditary and idiopathic neuropathy, unspecified: Secondary | ICD-10-CM

## 2013-03-22 DIAGNOSIS — K08109 Complete loss of teeth, unspecified cause, unspecified class: Secondary | ICD-10-CM

## 2013-03-22 DIAGNOSIS — E039 Hypothyroidism, unspecified: Secondary | ICD-10-CM

## 2013-03-22 DIAGNOSIS — K5900 Constipation, unspecified: Secondary | ICD-10-CM

## 2013-03-22 LAB — CBC WITH DIFFERENTIAL/PLATELET
BASO%: 0.2 % (ref 0.0–2.0)
Basophils Absolute: 0 10*3/uL (ref 0.0–0.1)
Eosinophils Absolute: 0.1 10*3/uL (ref 0.0–0.5)
HCT: 32.2 % — ABNORMAL LOW (ref 38.4–49.9)
HGB: 11 g/dL — ABNORMAL LOW (ref 13.0–17.1)
LYMPH%: 21.7 % (ref 14.0–49.0)
MONO#: 0.3 10*3/uL (ref 0.1–0.9)
NEUT#: 2.8 10*3/uL (ref 1.5–6.5)
Platelets: 180 10*3/uL (ref 140–400)
RBC: 3.52 10*6/uL — ABNORMAL LOW (ref 4.20–5.82)
WBC: 4.2 10*3/uL (ref 4.0–10.3)
lymph#: 0.9 10*3/uL (ref 0.9–3.3)
nRBC: 0 % (ref 0–0)

## 2013-03-22 LAB — COMPREHENSIVE METABOLIC PANEL (CC13)
ALT: 12 U/L (ref 0–55)
AST: 25 U/L (ref 5–34)
Albumin: 3.5 g/dL (ref 3.5–5.0)
Anion Gap: 8 mEq/L (ref 3–11)
CO2: 24 mEq/L (ref 22–29)
Calcium: 9.4 mg/dL (ref 8.4–10.4)
Chloride: 99 mEq/L (ref 98–109)
Creatinine: 1.3 mg/dL (ref 0.7–1.3)
Potassium: 4.4 mEq/L (ref 3.5–5.1)
Total Bilirubin: 0.54 mg/dL (ref 0.20–1.20)
Total Protein: 8.6 g/dL — ABNORMAL HIGH (ref 6.4–8.3)

## 2013-03-22 MED ORDER — ONDANSETRON 16 MG/50ML IVPB (CHCC)
16.0000 mg | Freq: Once | INTRAVENOUS | Status: AC
  Administered 2013-03-22: 16 mg via INTRAVENOUS

## 2013-03-22 MED ORDER — DIPHENHYDRAMINE HCL 50 MG/ML IJ SOLN
50.0000 mg | Freq: Once | INTRAMUSCULAR | Status: AC
  Administered 2013-03-22: 50 mg via INTRAVENOUS

## 2013-03-22 MED ORDER — DEXAMETHASONE SODIUM PHOSPHATE 20 MG/5ML IJ SOLN
20.0000 mg | Freq: Once | INTRAMUSCULAR | Status: AC
  Administered 2013-03-22: 20 mg via INTRAVENOUS

## 2013-03-22 MED ORDER — PACLITAXEL CHEMO INJECTION 300 MG/50ML
80.0000 mg/m2 | Freq: Once | INTRAVENOUS | Status: AC
  Administered 2013-03-22: 132 mg via INTRAVENOUS
  Filled 2013-03-22: qty 22

## 2013-03-22 MED ORDER — SODIUM CHLORIDE 0.9 % IV SOLN
148.4000 mg | Freq: Once | INTRAVENOUS | Status: AC
  Administered 2013-03-22: 150 mg via INTRAVENOUS
  Filled 2013-03-22: qty 15

## 2013-03-22 MED ORDER — FAMOTIDINE IN NACL 20-0.9 MG/50ML-% IV SOLN
INTRAVENOUS | Status: AC
  Filled 2013-03-22: qty 50

## 2013-03-22 MED ORDER — SODIUM CHLORIDE 0.9 % IV SOLN
Freq: Once | INTRAVENOUS | Status: AC
  Administered 2013-03-22: 15:00:00 via INTRAVENOUS

## 2013-03-22 MED ORDER — HEPARIN SOD (PORK) LOCK FLUSH 100 UNIT/ML IV SOLN
250.0000 [IU] | Freq: Once | INTRAVENOUS | Status: AC | PRN
  Administered 2013-03-22: 250 [IU]
  Filled 2013-03-22: qty 5

## 2013-03-22 MED ORDER — DEXAMETHASONE SODIUM PHOSPHATE 20 MG/5ML IJ SOLN
INTRAMUSCULAR | Status: AC
  Filled 2013-03-22: qty 5

## 2013-03-22 MED ORDER — FAMOTIDINE IN NACL 20-0.9 MG/50ML-% IV SOLN
20.0000 mg | Freq: Once | INTRAVENOUS | Status: AC
  Administered 2013-03-22: 20 mg via INTRAVENOUS

## 2013-03-22 MED ORDER — DIPHENHYDRAMINE HCL 50 MG/ML IJ SOLN
INTRAMUSCULAR | Status: AC
  Filled 2013-03-22: qty 1

## 2013-03-22 MED ORDER — ONDANSETRON 16 MG/50ML IVPB (CHCC)
INTRAVENOUS | Status: AC
  Filled 2013-03-22: qty 16

## 2013-03-22 MED ORDER — SODIUM CHLORIDE 0.9 % IJ SOLN
10.0000 mL | INTRAMUSCULAR | Status: DC | PRN
  Administered 2013-03-22: 10 mL
  Filled 2013-03-22: qty 10

## 2013-03-22 MED ORDER — SENNOSIDES 8.6 MG PO TABS: 2.0000 | ORAL_TABLET | Freq: Two times a day (BID) | ORAL | Status: DC

## 2013-03-22 NOTE — Patient Instructions (Signed)
[facility] Discharge Instructions for Patients Receiving Chemotherapy  Today you received the following chemotherapy agents: Taxol and Carboplatin.   To help prevent nausea and vomiting after your treatment, we encourage you to take your nausea medication as directed.    If you develop nausea and vomiting that is not controlled by your nausea medication, call the clinic.   BELOW ARE SYMPTOMS THAT SHOULD BE REPORTED IMMEDIATELY:  *FEVER GREATER THAN 100.5 F  *CHILLS WITH OR WITHOUT FEVER  NAUSEA AND VOMITING THAT IS NOT CONTROLLED WITH YOUR NAUSEA MEDICATION  *UNUSUAL SHORTNESS OF BREATH  *UNUSUAL BRUISING OR BLEEDING  TENDERNESS IN MOUTH AND THROAT WITH OR WITHOUT PRESENCE OF ULCERS  *URINARY PROBLEMS  *BOWEL PROBLEMS  UNUSUAL RASH Items with * indicate a potential emergency and should be followed up as soon as possible.  Feel free to call the clinic you have any questions or concerns. The clinic phone number is [phone].    

## 2013-03-22 NOTE — Progress Notes (Signed)
[facility] OFFICE PROGRESS NOTE  Patient Care Team: Dorothyann Peng, MD as PCP - General (Internal Medicine) Serena Colonel, MD as Attending Physician (Otolaryngology) Dennis Bast, RN as Registered Nurse (Oncology)  DIAGNOSIS: Supraglottic squamous cell carcinoma status post laryngectomy and adjuvant treatment, now with recurrent disease in a metastatic fashion to bone  SUMMARY OF ONCOLOGIC HISTORY: He presented to the emergency department in January 2014 with a neck mass. After a multidisciplinary discussion, it was decided the patient to proceed with surgery followed by adjuvant treatment. In January 2014 initial biopsy of the right supraglottic region confirmed squamous cell carcinoma On 08/13/2012 he underwent laryngectomy and bilateral lymph node dissection. All margins were negative. There were bilateral lymph node involvement, and 10/25 lymph nodes were positive for extracapsular extension. Between 09/20/2012 to 10/29/2012 he received concurrent chemoradiation therapy with weekly cisplatin. September 2014: PET/CT scan showed multi focal hypermetabolic bone metastases.  INTERVAL HISTORY: Cody [name] 67 y.o. male returns for further followup. He start on cycle 1 of weekly carboplatin and Taxol on 03/15/2013. Apart from very slight nausea and very mild peripheral neuropathy, it does not cause major side effects to the patient. The peripheral neuropathy is described as tingling sensation in the tips of fingers and toes but not associated with pain. He still has mild constipation despite regular laxative with MiraLax  He denies any recent fever, chills, night sweats or abnormal weight loss  The patient denies any mouth sores,  vomiting or change in bowel habits  I have reviewed the past medical history, past surgical history, social history and family history with the patient and they are unchanged from previous note.  ALLERGIES:  has No Known Allergies.  MEDICATIONS:   Current Outpatient Prescriptions  Medication Sig Dispense Refill  . acetaminophen (TYLENOL) 325 MG tablet Take 650 mg by mouth daily as needed for pain.      Marland Kitchen HYDROcodone-acetaminophen (NORCO) 5-325 MG per tablet Take 1-2 tablets by mouth every 4 (four) hours as needed for pain.  25 tablet  1  . levothyroxine (SYNTHROID, LEVOTHROID) 25 MCG tablet Take 25 mcg by mouth daily before breakfast.      . Multiple Vitamin (MULTIVITAMIN) tablet Take 1 tablet by mouth daily.      . ondansetron (ZOFRAN) 8 MG tablet Take 1 tablet (8 mg total) by mouth every 8 (eight) hours as needed for nausea.  30 tablet  1  . polyethylene glycol (MIRALAX / GLYCOLAX) packet Take 17 g by mouth daily as needed (severe constipation.).      Marland Kitchen promethazine (PHENERGAN) 25 MG suppository Place 1 suppository (25 mg total) rectally every 6 (six) hours as needed for nausea.  12 each  0  . senna (SENOKOT) 8.6 MG tablet Take 2 tablets (17.2 mg total) by mouth 2 (two) times daily.  60 tablet  3   No current facility-administered medications for this visit.   Facility-Administered Medications Ordered in Other Visits  Medication Dose Route Frequency Provider Last Rate Last Dose  . sodium chloride 0.9 % injection 10 mL  10 mL Intracatheter PRN Artis Delay, MD   10 mL at 03/15/13 1808    REVIEW OF SYSTEMS:   Constitutional: Denies fevers, chills or abnormal weight loss Eyes: Denies blurriness of vision Ears, nose, mouth, throat, and face: Denies mucositis or sore throat Respiratory: Denies cough, dyspnea or wheezes Cardiovascular: Denies palpitation, chest discomfort or lower extremity swelling Skin: Denies abnormal skin rashes Lymphatics: Denies new lymphadenopathy or easy bruising Neurological:Denies numbness,  tingling or new weaknesses Behavioral/Psych: Mood is stable, no new changes  All other systems were reviewed with the patient and are negative.  PHYSICAL EXAMINATION: ECOG PERFORMANCE STATUS: 1 - Symptomatic but  completely ambulatory  Filed Vitals:   03/22/13 1310  BP: 118/80  Pulse: 76  Temp: 97.9 F (36.6 C)  Resp: 20   Filed Weights   03/22/13 1310  Weight: 126 lb 6.4 oz (57.335 kg)    GENERAL:alert, no distress and comfortable SKIN: skin color, texture, turgor are normal, no rashes or significant lesions EYES: normal, Conjunctiva are pink and non-injected, sclera clear OROPHARYNX:no exudate, no erythema and lips, buccal mucosa, and tongue normal  NECK: supple, thyroid normal size, non-tender, without nodularity LYMPH:  no palpable lymphadenopathy in the cervical, axillary or inguinal LUNGS: clear to auscultation and percussion with normal breathing effort HEART: regular rate & rhythm and no murmurs and no lower extremity edema ABDOMEN:abdomen soft, non-tender and normal bowel sounds Musculoskeletal:no cyanosis of digits and no clubbing  NEURO: alert & oriented x 3   LABORATORY DATA:  I have reviewed the data as listed    Component Value Date/Time   NA 134* 03/08/2013 1554   NA 136 08/19/2012 0625   K 4.1 03/08/2013 1554   K 4.3 08/19/2012 0625   CL 98 11/15/2012 0941   CL 100 08/19/2012 0625   CO2 28 03/08/2013 1554   CO2 28 08/19/2012 0625   GLUCOSE 89 03/08/2013 1554   GLUCOSE 90 11/15/2012 0941   GLUCOSE 109* 08/19/2012 0625   BUN 19.4 03/08/2013 1554   BUN 9 08/19/2012 0625   CREATININE 1.4* 03/08/2013 1554   CREATININE 0.69 08/19/2012 0625   CALCIUM 9.5 03/08/2013 1554   CALCIUM 8.3* 08/20/2012 0542   PROT 8.7* 03/08/2013 1554   PROT 6.9 08/19/2012 0625   ALBUMIN 3.5 03/08/2013 1554   ALBUMIN 2.4* 08/19/2012 0625   AST 23 03/08/2013 1554   AST 25 08/19/2012 0625   ALT 11 03/08/2013 1554   ALT 20 08/19/2012 0625   ALKPHOS 72 03/08/2013 1554   ALKPHOS 57 08/19/2012 0625   BILITOT 0.43 03/08/2013 1554   BILITOT 0.6 08/19/2012 0625   GFRNONAA >90 08/19/2012 0625   GFRAA >90 08/19/2012 0625    No results found for this basename: SPEP,  UPEP,   kappa and lambda light chains    Lab  Results  Component Value Date   WBC 4.2 03/22/2013   NEUTROABS 2.8 03/22/2013   HGB 11.0* 03/22/2013   HCT 32.2* 03/22/2013   MCV 91.5 03/22/2013   PLT 180 03/22/2013      Chemistry      Component Value Date/Time   NA 134* 03/08/2013 1554   NA 136 08/19/2012 0625   K 4.1 03/08/2013 1554   K 4.3 08/19/2012 0625   CL 98 11/15/2012 0941   CL 100 08/19/2012 0625   CO2 28 03/08/2013 1554   CO2 28 08/19/2012 0625   BUN 19.4 03/08/2013 1554   BUN 9 08/19/2012 0625   CREATININE 1.4* 03/08/2013 1554   CREATININE 0.69 08/19/2012 0625      Component Value Date/Time   CALCIUM 9.5 03/08/2013 1554   CALCIUM 8.3* 08/20/2012 0542   ALKPHOS 72 03/08/2013 1554   ALKPHOS 57 08/19/2012 0625   AST 23 03/08/2013 1554   AST 25 08/19/2012 0625   ALT 11 03/08/2013 1554   ALT 20 08/19/2012 0625   BILITOT 0.43 03/08/2013 1554   BILITOT 0.6 08/19/2012 0625     ASSESSMENT:  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 I would like to try 3 weeks on and one week off treatment for him. I plan to restage him after 3 cycles of chemotherapy to assess response to treatment. #2 mild peripheral neuropathy This is not unexpected side effects from chemotherapy. It is only mild, grade 1. We will observe for now without dosage adjustment #3 constipation He could be related to under treatment of his hypothyroidism. In any case, I cannot add on Senokot on top of rectal of MiraLax to treat this #4 significant hypothyroidism The patient just started taking thyroid supplement regularly. I will recheck his thyroid function tests when I see you next #5 anemia This is likely due to recent treatment. The patient denies recent history of bleeding such as epistaxis, hematuria or hematochezia. He is asymptomatic from the anemia. I will observe for now.  he does not require transfusion now. I will  continue the chemotherapy at current dose without dosage adjustment.  If the anemia gets progressive worse in the future, I might have to delay his treatment or adjust the chemotherapy dose. #6 mildly elevated creatinine function His chemotherapy doses has been adjusted because of this. I continue to encourage patient to increase oral intake #7 bony metastasis I will hold off giving him IV bisphosphonates to reduce the risk of osteonecrosis of the jaw due to recent radiation treatment. The patient is asymptomatic at this point in time.   Orders Placed This Encounter  Procedures  . CBC with Differential    Standing Status: Standing     Number of Occurrences: 2     Standing Expiration Date: 03/22/2014  . Comprehensive metabolic panel    Standing Status: Standing     Number of Occurrences: 2     Standing Expiration Date: 03/22/2014  . TSH    Standing Status: Future     Number of Occurrences:      Standing Expiration Date: 03/22/2014  . T4, free    Standing Status: Future     Number of Occurrences:      Standing Expiration Date: 03/22/2014   All questions were answered. The patient knows to call the clinic with any problems, questions or concerns. No barriers to learning was detected.    Reya Aurich, MD 03/22/2013 1:34 PM

## 2013-03-22 NOTE — Progress Notes (Signed)
Met with pt and his dtr while receiving scheduled infusion.   Pt appeared to be doing well, was enjoying snack of tomato soup, cheese and crackers.  He did not express any concerns.  In response to my inquiry re: his mobility, he indicated that he continues to be unsteady on his feet.  His dtr stated that he is hesitating to use a cane at this time though he really should be using one.  I encouraged him to reconsider, stating that his safety is important.  He indicated understanding.  Will continue to navigate as L2 (treatments established) patient.  Young Berry, RN, BSN, [facility] Head & Neck Oncology Navigator [phone]

## 2013-03-22 NOTE — Telephone Encounter (Signed)
Gave pt appt for lab,md and chemo for October and November , emailed michelle for Chemo appt for November

## 2013-03-22 NOTE — Telephone Encounter (Signed)
Per staff message and POF I have scheduled appts.  JMW  

## 2013-03-22 NOTE — Patient Instructions (Signed)
Patient to keep dentures out if sore spots develop. Use salt water rinses as needed to aid healing. Return to clinic as scheduled for denture adjustment.   Call if problems arise before then.  Mikenzie Mccannon F. Fawzi Melman, DDS  

## 2013-03-22 NOTE — Progress Notes (Signed)
03/22/2013  Patient:            Cody [name] Date of Birth:  [birth year] MRN:                [id number]  BP 116/69  Pulse 73  Temp(Src) 98.3 F (36.8 C) (Oral)   Cody [name] presents for evaluation of recently inserted upper and lower complete dentures.  SUBJECTIVE: Patient is complaining of denture irritation to the lower left lower right lingual and the posterior molar areas. OBJECTIVE: There is no sign of denture ulceration. Patient with generalized erythema of the oral tissues. Procedure: Pressure indicating paste applied to dentures. Adjustments made as needed. [place]. Occlusion evaluated and adjustments made as needed for Centric Relation and protrusive strokes. Patient accepts results.  Patient to keep dentures out if sore spots develop. Use salt water rinses as needed to aid healing. Return to clinic as scheduled for denture adjustment.  Call if problems arise before then. Patient dismissed in stable condition.  Charlynne Pander, DDS

## 2013-03-23 ENCOUNTER — Ambulatory Visit: Payer: Self-pay | Admitting: Hematology and Oncology

## 2013-03-23 ENCOUNTER — Telehealth: Payer: Self-pay | Admitting: Hematology and Oncology

## 2013-03-23 NOTE — Telephone Encounter (Signed)
Called pt and left message regarding lab and chemo for 10/21

## 2013-03-29 ENCOUNTER — Other Ambulatory Visit (HOSPITAL_BASED_OUTPATIENT_CLINIC_OR_DEPARTMENT_OTHER): Payer: Medicare Other | Admitting: Lab

## 2013-03-29 ENCOUNTER — Encounter: Payer: Self-pay | Admitting: *Deleted

## 2013-03-29 ENCOUNTER — Ambulatory Visit (HOSPITAL_BASED_OUTPATIENT_CLINIC_OR_DEPARTMENT_OTHER): Payer: Medicare Other

## 2013-03-29 VITALS — BP 121/70 | HR 77 | Temp 97.8°F | Resp 19 | Ht 69.0 in

## 2013-03-29 DIAGNOSIS — C7951 Secondary malignant neoplasm of bone: Secondary | ICD-10-CM

## 2013-03-29 DIAGNOSIS — Z5111 Encounter for antineoplastic chemotherapy: Secondary | ICD-10-CM

## 2013-03-29 DIAGNOSIS — C321 Malignant neoplasm of supraglottis: Secondary | ICD-10-CM

## 2013-03-29 LAB — CBC WITH DIFFERENTIAL/PLATELET
BASO%: 0.7 % (ref 0.0–2.0)
EOS%: 1.4 % (ref 0.0–7.0)
HGB: 11.4 g/dL — ABNORMAL LOW (ref 13.0–17.1)
LYMPH%: 19.4 % (ref 14.0–49.0)
MCH: 31.7 pg (ref 27.2–33.4)
MCHC: 33.9 g/dL (ref 32.0–36.0)
MCV: 93.4 fL (ref 79.3–98.0)
Platelets: 199 10*3/uL (ref 140–400)
RBC: 3.58 10*6/uL — ABNORMAL LOW (ref 4.20–5.82)
RDW: 15.1 % — ABNORMAL HIGH (ref 11.0–14.6)
lymph#: 1.1 10*3/uL (ref 0.9–3.3)

## 2013-03-29 LAB — COMPREHENSIVE METABOLIC PANEL (CC13)
ALT: 12 U/L (ref 0–55)
AST: 21 U/L (ref 5–34)
Albumin: 3.5 g/dL (ref 3.5–5.0)
Calcium: 9.7 mg/dL (ref 8.4–10.4)
Chloride: 101 mEq/L (ref 98–109)
Potassium: 4.3 mEq/L (ref 3.5–5.1)

## 2013-03-29 MED ORDER — DIPHENHYDRAMINE HCL 50 MG/ML IJ SOLN
50.0000 mg | Freq: Once | INTRAMUSCULAR | Status: AC
  Administered 2013-03-29: 50 mg via INTRAVENOUS

## 2013-03-29 MED ORDER — ONDANSETRON 16 MG/50ML IVPB (CHCC)
16.0000 mg | Freq: Once | INTRAVENOUS | Status: AC
  Administered 2013-03-29: 16 mg via INTRAVENOUS

## 2013-03-29 MED ORDER — FAMOTIDINE IN NACL 20-0.9 MG/50ML-% IV SOLN
INTRAVENOUS | Status: AC
  Filled 2013-03-29: qty 50

## 2013-03-29 MED ORDER — SODIUM CHLORIDE 0.9 % IV SOLN
Freq: Once | INTRAVENOUS | Status: AC
  Administered 2013-03-29: 16:00:00 via INTRAVENOUS

## 2013-03-29 MED ORDER — HEPARIN SOD (PORK) LOCK FLUSH 100 UNIT/ML IV SOLN
500.0000 [IU] | Freq: Once | INTRAVENOUS | Status: AC | PRN
  Administered 2013-03-29: 500 [IU]
  Filled 2013-03-29: qty 5

## 2013-03-29 MED ORDER — DEXAMETHASONE SODIUM PHOSPHATE 20 MG/5ML IJ SOLN
INTRAMUSCULAR | Status: AC
  Filled 2013-03-29: qty 5

## 2013-03-29 MED ORDER — ONDANSETRON 16 MG/50ML IVPB (CHCC)
INTRAVENOUS | Status: AC
  Filled 2013-03-29: qty 16

## 2013-03-29 MED ORDER — DEXAMETHASONE SODIUM PHOSPHATE 20 MG/5ML IJ SOLN
20.0000 mg | Freq: Once | INTRAMUSCULAR | Status: AC
  Administered 2013-03-29: 20 mg via INTRAVENOUS

## 2013-03-29 MED ORDER — FAMOTIDINE IN NACL 20-0.9 MG/50ML-% IV SOLN
20.0000 mg | Freq: Once | INTRAVENOUS | Status: AC
  Administered 2013-03-29: 20 mg via INTRAVENOUS

## 2013-03-29 MED ORDER — SODIUM CHLORIDE 0.9 % IJ SOLN
10.0000 mL | INTRAMUSCULAR | Status: DC | PRN
  Administered 2013-03-29: 10 mL
  Filled 2013-03-29: qty 10

## 2013-03-29 MED ORDER — DIPHENHYDRAMINE HCL 50 MG/ML IJ SOLN
INTRAMUSCULAR | Status: AC
  Filled 2013-03-29: qty 1

## 2013-03-29 MED ORDER — SODIUM CHLORIDE 0.9 % IV SOLN
120.0000 mg | Freq: Once | INTRAVENOUS | Status: AC
  Administered 2013-03-29: 120 mg via INTRAVENOUS
  Filled 2013-03-29: qty 12

## 2013-03-29 MED ORDER — PACLITAXEL CHEMO INJECTION 300 MG/50ML
80.0000 mg/m2 | Freq: Once | INTRAVENOUS | Status: AC
  Administered 2013-03-29: 132 mg via INTRAVENOUS
  Filled 2013-03-29: qty 22

## 2013-03-29 NOTE — Progress Notes (Signed)
Met with pt during scheduled infusion.  Pt did not express any concerns.  Will continue to navigate as L2 (treatments established) patient.  Young Berry, RN, BSN, [facility] Head & Neck Oncology Navigator [phone]

## 2013-03-29 NOTE — Patient Instructions (Signed)
[facility] Discharge Instructions for Patients Receiving Chemotherapy  Today you received the following chemotherapy agents: Taxol and Carboplatin.  To help prevent nausea and vomiting after your treatment, we encourage you to take your nausea medication as prescribed.   If you develop nausea and vomiting that is not controlled by your nausea medication, call the clinic.   BELOW ARE SYMPTOMS THAT SHOULD BE REPORTED IMMEDIATELY:  *FEVER GREATER THAN 100.5 F  *CHILLS WITH OR WITHOUT FEVER  NAUSEA AND VOMITING THAT IS NOT CONTROLLED WITH YOUR NAUSEA MEDICATION  *UNUSUAL SHORTNESS OF BREATH  *UNUSUAL BRUISING OR BLEEDING  TENDERNESS IN MOUTH AND THROAT WITH OR WITHOUT PRESENCE OF ULCERS  *URINARY PROBLEMS  *BOWEL PROBLEMS  UNUSUAL RASH Items with * indicate a potential emergency and should be followed up as soon as possible.  Feel free to call the clinic you have any questions or concerns. The clinic phone number is [phone].    

## 2013-04-04 ENCOUNTER — Ambulatory Visit (HOSPITAL_COMMUNITY): Payer: Self-pay | Admitting: Dentistry

## 2013-04-04 ENCOUNTER — Encounter (HOSPITAL_COMMUNITY): Payer: Self-pay | Admitting: Dentistry

## 2013-04-04 VITALS — BP 109/66 | HR 78 | Temp 98.2°F

## 2013-04-04 DIAGNOSIS — K08109 Complete loss of teeth, unspecified cause, unspecified class: Secondary | ICD-10-CM

## 2013-04-04 DIAGNOSIS — Z972 Presence of dental prosthetic device (complete) (partial): Secondary | ICD-10-CM

## 2013-04-04 DIAGNOSIS — K062 Gingival and edentulous alveolar ridge lesions associated with trauma: Secondary | ICD-10-CM

## 2013-04-04 DIAGNOSIS — K117 Disturbances of salivary secretion: Secondary | ICD-10-CM

## 2013-04-04 DIAGNOSIS — Z463 Encounter for fitting and adjustment of dental prosthetic device: Secondary | ICD-10-CM

## 2013-04-04 NOTE — Progress Notes (Signed)
04/04/2013  Patient:            Cody [name] Date of Birth:  [birth year] MRN:                [id number]  BP 109/66  Pulse 78  Temp(Src) 98.2 F (36.8 C) (Oral)   KHALID LACKO presents for evaluation of recently inserted upper and lower complete dentures.  SUBJECTIVE: Patient is complaining of tenderness to the lower left and lower right lingual alveolar ridge in the posterior molar areas. OBJECTIVE: There is no sign of denture ulceration. Patient with generalized erythema of the oral tissues but no specific denture irritation noted. Procedure: Pressure indicating paste applied to dentures. Adjustments made as needed. [place]. Occlusion evaluated and adjustments made as needed for Centric Relation and protrusive strokes. Patient accepts results.  Patient to keep dentures out if sore spots develop. Use salt water rinses as needed to aid healing. Return to clinic as scheduled for denture adjustment.  Call if problems arise before then. Patient dismissed in stable condition.  Charlynne Pander, DDS

## 2013-04-04 NOTE — Patient Instructions (Signed)
Patient to keep dentures out if sore spots develop. Use salt water rinses as needed to aid healing. Return to clinic as scheduled for denture adjustment.   Call if problems arise before then. Dr. Kulinski  

## 2013-04-12 ENCOUNTER — Ambulatory Visit (HOSPITAL_BASED_OUTPATIENT_CLINIC_OR_DEPARTMENT_OTHER): Payer: Medicare Other | Admitting: Hematology and Oncology

## 2013-04-12 ENCOUNTER — Other Ambulatory Visit (HOSPITAL_BASED_OUTPATIENT_CLINIC_OR_DEPARTMENT_OTHER): Payer: Medicare Other | Admitting: Lab

## 2013-04-12 ENCOUNTER — Telehealth: Payer: Self-pay | Admitting: *Deleted

## 2013-04-12 ENCOUNTER — Ambulatory Visit (HOSPITAL_BASED_OUTPATIENT_CLINIC_OR_DEPARTMENT_OTHER): Payer: Medicare Other

## 2013-04-12 ENCOUNTER — Other Ambulatory Visit: Payer: Self-pay | Admitting: Hematology and Oncology

## 2013-04-12 ENCOUNTER — Encounter: Payer: Self-pay | Admitting: *Deleted

## 2013-04-12 VITALS — BP 112/63 | HR 77 | Temp 96.6°F | Resp 17 | Ht 69.0 in | Wt 123.6 lb

## 2013-04-12 DIAGNOSIS — C7951 Secondary malignant neoplasm of bone: Secondary | ICD-10-CM

## 2013-04-12 DIAGNOSIS — Z5111 Encounter for antineoplastic chemotherapy: Secondary | ICD-10-CM

## 2013-04-12 DIAGNOSIS — E039 Hypothyroidism, unspecified: Secondary | ICD-10-CM

## 2013-04-12 DIAGNOSIS — C321 Malignant neoplasm of supraglottis: Secondary | ICD-10-CM

## 2013-04-12 DIAGNOSIS — G609 Hereditary and idiopathic neuropathy, unspecified: Secondary | ICD-10-CM

## 2013-04-12 DIAGNOSIS — D649 Anemia, unspecified: Secondary | ICD-10-CM

## 2013-04-12 DIAGNOSIS — R42 Dizziness and giddiness: Secondary | ICD-10-CM

## 2013-04-12 LAB — CBC WITH DIFFERENTIAL/PLATELET
Eosinophils Absolute: 0.1 10*3/uL (ref 0.0–0.5)
MCH: 31.3 pg (ref 27.2–33.4)
MCV: 90.5 fL (ref 79.3–98.0)
MONO#: 0.8 10*3/uL (ref 0.1–0.9)
MONO%: 17.9 % — ABNORMAL HIGH (ref 0.0–14.0)
NEUT#: 2.8 10*3/uL (ref 1.5–6.5)
RBC: 3.48 10*6/uL — ABNORMAL LOW (ref 4.20–5.82)
RDW: 15.5 % — ABNORMAL HIGH (ref 11.0–14.6)
WBC: 4.6 10*3/uL (ref 4.0–10.3)

## 2013-04-12 LAB — COMPREHENSIVE METABOLIC PANEL (CC13)
AST: 18 U/L (ref 5–34)
Albumin: 3.3 g/dL — ABNORMAL LOW (ref 3.5–5.0)
Alkaline Phosphatase: 63 U/L (ref 40–150)
Anion Gap: 9 mEq/L (ref 3–11)
Calcium: 9.6 mg/dL (ref 8.4–10.4)
Glucose: 89 mg/dl (ref 70–140)
Potassium: 4.2 mEq/L (ref 3.5–5.1)
Sodium: 133 mEq/L — ABNORMAL LOW (ref 136–145)
Total Protein: 8.5 g/dL — ABNORMAL HIGH (ref 6.4–8.3)

## 2013-04-12 LAB — T4, FREE: Free T4: 0.75 ng/dL — ABNORMAL LOW (ref 0.80–1.80)

## 2013-04-12 LAB — TSH CHCC: TSH: 40.151 m(IU)/L — ABNORMAL HIGH (ref 0.320–4.118)

## 2013-04-12 MED ORDER — DIPHENHYDRAMINE HCL 50 MG/ML IJ SOLN
INTRAMUSCULAR | Status: AC
  Filled 2013-04-12: qty 1

## 2013-04-12 MED ORDER — DEXAMETHASONE SODIUM PHOSPHATE 20 MG/5ML IJ SOLN
20.0000 mg | Freq: Once | INTRAMUSCULAR | Status: AC
  Administered 2013-04-12: 20 mg via INTRAVENOUS

## 2013-04-12 MED ORDER — SODIUM CHLORIDE 0.9 % IV SOLN
120.0000 mg | Freq: Once | INTRAVENOUS | Status: AC
  Administered 2013-04-12: 120 mg via INTRAVENOUS
  Filled 2013-04-12: qty 12

## 2013-04-12 MED ORDER — SODIUM CHLORIDE 0.9 % IV SOLN
Freq: Once | INTRAVENOUS | Status: AC
  Administered 2013-04-12: 10:00:00 via INTRAVENOUS

## 2013-04-12 MED ORDER — FAMOTIDINE IN NACL 20-0.9 MG/50ML-% IV SOLN
20.0000 mg | Freq: Once | INTRAVENOUS | Status: AC
  Administered 2013-04-12: 20 mg via INTRAVENOUS

## 2013-04-12 MED ORDER — ONDANSETRON 16 MG/50ML IVPB (CHCC)
16.0000 mg | Freq: Once | INTRAVENOUS | Status: AC
  Administered 2013-04-12: 16 mg via INTRAVENOUS

## 2013-04-12 MED ORDER — PACLITAXEL CHEMO INJECTION 300 MG/50ML
80.0000 mg/m2 | Freq: Once | INTRAVENOUS | Status: AC
  Administered 2013-04-12: 132 mg via INTRAVENOUS
  Filled 2013-04-12: qty 22

## 2013-04-12 MED ORDER — HEPARIN SOD (PORK) LOCK FLUSH 100 UNIT/ML IV SOLN
500.0000 [IU] | Freq: Once | INTRAVENOUS | Status: AC | PRN
  Administered 2013-04-12: 500 [IU]
  Filled 2013-04-12: qty 5

## 2013-04-12 MED ORDER — DEXAMETHASONE SODIUM PHOSPHATE 20 MG/5ML IJ SOLN
INTRAMUSCULAR | Status: AC
  Filled 2013-04-12: qty 5

## 2013-04-12 MED ORDER — ONDANSETRON 16 MG/50ML IVPB (CHCC)
INTRAVENOUS | Status: AC
  Filled 2013-04-12: qty 16

## 2013-04-12 MED ORDER — SODIUM CHLORIDE 0.9 % IJ SOLN
10.0000 mL | INTRAMUSCULAR | Status: DC | PRN
  Administered 2013-04-12: 10 mL
  Filled 2013-04-12: qty 10

## 2013-04-12 MED ORDER — DIPHENHYDRAMINE HCL 50 MG/ML IJ SOLN
12.5000 mg | Freq: Once | INTRAMUSCULAR | Status: AC
  Administered 2013-04-12: 12.5 mg via INTRAVENOUS

## 2013-04-12 MED ORDER — FAMOTIDINE IN NACL 20-0.9 MG/50ML-% IV SOLN
INTRAVENOUS | Status: AC
  Filled 2013-04-12: qty 50

## 2013-04-12 MED ORDER — LEVOTHYROXINE SODIUM 50 MCG PO TABS: 50.0000 ug | ORAL_TABLET | Freq: Every day | ORAL | Status: DC

## 2013-04-12 NOTE — Telephone Encounter (Signed)
Left VM for daughter informing of new Rx for increased dose of synthroid sent to pt's pharmacy.  Asked her to call back if any questions.

## 2013-04-12 NOTE — Patient Instructions (Signed)
[facility] Discharge Instructions for Patients Receiving Chemotherapy  Today you received the following chemotherapy agents: Carboplatin, Taxol   To help prevent nausea and vomiting after your treatment, we encourage you to take your nausea medication as prescribed.    If you develop nausea and vomiting that is not controlled by your nausea medication, call the clinic.   BELOW ARE SYMPTOMS THAT SHOULD BE REPORTED IMMEDIATELY:  *FEVER GREATER THAN 100.5 F  *CHILLS WITH OR WITHOUT FEVER  NAUSEA AND VOMITING THAT IS NOT CONTROLLED WITH YOUR NAUSEA MEDICATION  *UNUSUAL SHORTNESS OF BREATH  *UNUSUAL BRUISING OR BLEEDING  TENDERNESS IN MOUTH AND THROAT WITH OR WITHOUT PRESENCE OF ULCERS  *URINARY PROBLEMS  *BOWEL PROBLEMS  UNUSUAL RASH Items with * indicate a potential emergency and should be followed up as soon as possible.  Feel free to call the clinic you have any questions or concerns. The clinic phone number is [phone].    

## 2013-04-12 NOTE — Progress Notes (Signed)
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     07/09/2012 Initial Diagnosis Carcinoma of supraglottis from biopsy, HPV negative   08/13/2012 Surgery He underwent laryngectomy and bilateral LN dissection. There were bilateral LN involvement and 10/25 LN were positive with extracapsular extension staging T3N2Mx   09/20/2012 - 10/29/2012 Chemotherapy he received concurrent chemo/Rt with weekly cisplatin   02/23/2013 Imaging Staging PET/Ct scan showed focal hypermetabolic bone lesions from metastatic cancer   Carcinoma of supraglottis   Primary site: Larynx - Supraglottis (Right)   Staging method: AJCC 7th Edition   Clinical free text: T3N2M1   Clinical: (T3, N2c, M1)   Summary: (T3, N2c, M1)      Carcinoma of supraglottis   07/03/2012 Imaging Primary malignancy suspected in the supraglottic, glottic and hypopharyngeal region with necrotic bilateral adenopathy larger on the right.     07/09/2012 Initial Diagnosis Carcinoma of supraglottis from biopsy, HPV negative   08/13/2012 Surgery He underwent laryngectomy and bilateral LN dissection. There were bilateral LN involvement and 10/25 LN were positive with extracapsular extension staging T3N2Mx   09/20/2012 - 10/29/2012 Chemotherapy he received concurrent chemo/Rt with weekly cisplatin   02/23/2013 Imaging Staging PET/Ct scan showed focal hypermetabolic bone lesions from metastatic  cancer   03/09/2013 Procedure Placement of infusaport under IR   03/15/2013 -  Chemotherapy Start cycle 1 palliative chemotherapy with Weekly Carboplatin & Paclitacel at mild dosage adjustment due to elevated creatinine    INTERVAL HISTORY: Cody [name] 67 y.o. male returns for further followup prior to cycle 2 of treatment. He tolerated treatment well apart from some dizziness before treatment. He thought that could be related to Benadryl. He has very mild worsening peripheral neuropathy with sensation of cold and tingling in the tips in fingers and toes. He does not feel pain. He's had mild constipation. He is not taking his laxatives consistently.  I have reviewed the past medical history, past surgical history, social history and family history with the patient and they are unchanged from previous note.  ALLERGIES:  has No Known Allergies.  MEDICATIONS:  Current Outpatient Prescriptions  Medication Sig Dispense Refill  . acetaminophen (TYLENOL) 325 MG tablet Take 650 mg by mouth daily as needed for pain.      Marland Kitchen HYDROcodone-acetaminophen (NORCO) 5-325 MG per tablet Take 1-2 tablets by mouth every 4 (four) hours as needed for pain.  25 tablet  1  . levothyroxine (SYNTHROID, LEVOTHROID) 25 MCG tablet Take 25 mcg by mouth daily before breakfast.      . Multiple Vitamin (MULTIVITAMIN) tablet Take 1 tablet by mouth daily.      . ondansetron (ZOFRAN) 8 MG tablet Take 1 tablet (8 mg total) by mouth every 8 (eight) hours as needed for nausea.  30 tablet  1  . promethazine (PHENERGAN) 25 MG suppository Place 1 suppository (25 mg total) rectally every 6 (six) hours as needed for nausea.  12 each  0  . senna (SENOKOT) 8.6 MG tablet Take 2 tablets (17.2 mg total) by mouth  2 (two) times daily.  60 tablet  3   No current facility-administered medications for this visit.   Facility-Administered Medications Ordered in Other Visits  Medication Dose Route Frequency Provider Last Rate Last Dose  .  sodium chloride 0.9 % injection 10 mL  10 mL Intracatheter PRN Artis Delay, MD   10 mL at 03/15/13 1808    REVIEW OF SYSTEMS:   Constitutional: Denies fevers, chills or abnormal weight loss Eyes: Denies blurriness of vision Ears, nose, mouth, throat, and face: Denies mucositis or sore throat Respiratory: Denies cough, dyspnea or wheezes Cardiovascular: Denies palpitation, chest discomfort or lower extremity swelling Skin: Denies abnormal skin rashes Lymphatics: Denies new lymphadenopathy or easy bruising  Behavioral/Psych: Mood is stable, no new changes  All other systems were reviewed with the patient and are negative.  PHYSICAL EXAMINATION: ECOG PERFORMANCE STATUS: 1 - Symptomatic but completely ambulatory  Filed Vitals:   04/12/13 0919  BP: 112/63  Pulse: 77  Temp: 96.6 F (35.9 C)  Resp: 17   Filed Weights   04/12/13 0919  Weight: 123 lb 9.6 oz (56.065 kg)    GENERAL:alert, no distress and comfortable. He'll thin and mildly cachectic SKIN: skin color, texture, turgor are normal, no rashes or significant lesions EYES: normal, Conjunctiva are pink and non-injected, sclera clear OROPHARYNX:no exudate, no erythema and lips, buccal mucosa, and tongue normal  NECK: Tracheostomy site looks well-healed with no evidence of infection LYMPH:  no palpable lymphadenopathy in the cervical, axillary or inguinal LUNGS: clear to auscultation and percussion with normal breathing effort HEART: regular rate & rhythm and no murmurs and no lower extremity edema ABDOMEN:abdomen soft, non-tender and normal bowel sounds Musculoskeletal:no cyanosis of digits and no clubbing  NEURO: alert & oriented x 3, no focal motor/sensory deficits  LABORATORY DATA:  I have reviewed the data as listed    Component Value Date/Time   NA 134* 03/29/2013 1329   NA 136 08/19/2012 0625   K 4.3 03/29/2013 1329   K 4.3 08/19/2012 0625   CL 98 11/15/2012 0941   CL 100 08/19/2012 0625   CO2 25 03/29/2013 1329    CO2 28 08/19/2012 0625   GLUCOSE 90 03/29/2013 1329   GLUCOSE 90 11/15/2012 0941   GLUCOSE 109* 08/19/2012 0625   BUN 18.3 03/29/2013 1329   BUN 9 08/19/2012 0625   CREATININE 1.6* 03/29/2013 1329   CREATININE 0.69 08/19/2012 0625   CALCIUM 9.7 03/29/2013 1329   CALCIUM 8.3* 08/20/2012 0542   PROT 8.9* 03/29/2013 1329   PROT 6.9 08/19/2012 0625   ALBUMIN 3.5 03/29/2013 1329   ALBUMIN 2.4* 08/19/2012 0625   AST 21 03/29/2013 1329   AST 25 08/19/2012 0625   ALT 12 03/29/2013 1329   ALT 20 08/19/2012 0625   ALKPHOS 73 03/29/2013 1329   ALKPHOS 57 08/19/2012 0625   BILITOT 0.46 03/29/2013 1329   BILITOT 0.6 08/19/2012 0625   GFRNONAA >90 08/19/2012 0625   GFRAA >90 08/19/2012 0625    No results found for this basename: SPEP,  UPEP,   kappa and lambda light chains    Lab Results  Component Value Date   WBC 4.6 04/12/2013   NEUTROABS 2.8 04/12/2013   HGB 10.9* 04/12/2013   HCT 31.5* 04/12/2013   MCV 90.5 04/12/2013   PLT 202 04/12/2013      Chemistry      Component Value Date/Time   NA 134* 03/29/2013 1329   NA 136 08/19/2012 0625   K 4.3 03/29/2013 1329  K 4.3 08/19/2012 0625   CL 98 11/15/2012 0941   CL 100 08/19/2012 0625   CO2 25 03/29/2013 1329   CO2 28 08/19/2012 0625   BUN 18.3 03/29/2013 1329   BUN 9 08/19/2012 0625   CREATININE 1.6* 03/29/2013 1329   CREATININE 0.69 08/19/2012 0625      Component Value Date/Time   CALCIUM 9.7 03/29/2013 1329   CALCIUM 8.3* 08/20/2012 0542   ALKPHOS 73 03/29/2013 1329   ALKPHOS 57 08/19/2012 0625   AST 21 03/29/2013 1329   AST 25 08/19/2012 0625   ALT 12 03/29/2013 1329   ALT 20 08/19/2012 0625   BILITOT 0.46 03/29/2013 1329   BILITOT 0.6 08/19/2012 0625      ASSESSMENT:  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 I plan to restage him after 3 cycles of chemotherapy to assess response to treatment. I  would order a PET CT scan for the end of the month. #2 mild peripheral neuropathy This is not unexpected side effects from chemotherapy. It is only mild, grade 1. We will observe for now without dosage adjustment #3 constipation He could be related to under treatment of his hypothyroidism. Thyroid function tests pending. I recommend he take Senokot regularly  #4 significant hypothyroidism The patient just started taking thyroid supplement regularly. Thyroid function tests pending. We will adjust as needed. #5 anemia This is likely due to recent treatment. The patient denies recent history of bleeding such as epistaxis, hematuria or hematochezia. He is asymptomatic from the anemia. I will observe for now.  he does not require transfusion now. I will continue the chemotherapy at current dose without dosage adjustment.  If the anemia gets progressive worse in the future, I might have to delay his treatment or adjust the chemotherapy dose. #6 mildly elevated creatinine function His chemotherapy doses has been adjusted because of this. I continue to encourage patient to increase oral intake #7 bony metastasis I will hold off giving him IV bisphosphonates to reduce the risk of osteonecrosis of the jaw due to recent radiation treatment. The patient is asymptomatic at this point in time. #8 dizziness This could be due to high-dose Benadryl. I would reduce the dose of Benadryl to see if this would help with his dizziness.  Orders Placed This Encounter  Procedures  . NM PET Image Restag (PS) Skull Base To Thigh    Standing Status: Future     Number of Occurrences:      Standing Expiration Date: 04/12/2014    Order Specific Question:  Reason for exam:    Answer:  assess response to Rx, recurrent cancer of head&neck, please schedule on the last week of this month    Order Specific Question:  Preferred imaging location?    Answer:  [facility]  . CBC with Differential    Standing Status: Future      Number of Occurrences:      Standing Expiration Date: 01/02/2014  . Comprehensive metabolic panel    Standing Status: Future     Number of Occurrences:      Standing Expiration Date: 04/12/2014   All questions were answered. The patient knows to call the clinic with any problems, questions or concerns. No barriers to learning was detected.    Quinto Tippy, MD 04/12/2013 9:37 AM

## 2013-04-13 ENCOUNTER — Telehealth: Payer: Self-pay | Admitting: Hematology and Oncology

## 2013-04-13 NOTE — Telephone Encounter (Signed)
appts should have been on 11/11 not 11/12 advised pts sister of same calendar mailed shh

## 2013-04-13 NOTE — Telephone Encounter (Signed)
lvmm for pt adv of 11/12 lab and dr appt cal mailed shh

## 2013-04-13 NOTE — Telephone Encounter (Signed)
worked 11/4 POF cal and avs printed and mailed shh

## 2013-04-14 ENCOUNTER — Other Ambulatory Visit: Payer: Self-pay

## 2013-04-18 NOTE — Progress Notes (Signed)
Spoke with patient and his daughter in lobby while they waited for scheduled appt with Dr. Bertis Ruddy.  Pt appeared to be in good spirits; did not express any concerns.  Will continue to navigate as L3 (treatments completed) patient.  Young Berry, RN, BSN, [facility] Head & Neck Oncology Navigator [phone]

## 2013-04-19 ENCOUNTER — Telehealth: Payer: Self-pay | Admitting: Hematology and Oncology

## 2013-04-19 ENCOUNTER — Other Ambulatory Visit (HOSPITAL_BASED_OUTPATIENT_CLINIC_OR_DEPARTMENT_OTHER): Payer: Medicare Other | Admitting: Lab

## 2013-04-19 ENCOUNTER — Ambulatory Visit (HOSPITAL_BASED_OUTPATIENT_CLINIC_OR_DEPARTMENT_OTHER): Payer: Medicare Other

## 2013-04-19 ENCOUNTER — Encounter: Payer: Self-pay | Admitting: Hematology and Oncology

## 2013-04-19 ENCOUNTER — Telehealth: Payer: Self-pay | Admitting: *Deleted

## 2013-04-19 ENCOUNTER — Encounter: Payer: Self-pay | Admitting: *Deleted

## 2013-04-19 ENCOUNTER — Ambulatory Visit (HOSPITAL_BASED_OUTPATIENT_CLINIC_OR_DEPARTMENT_OTHER): Payer: Medicare Other | Admitting: Hematology and Oncology

## 2013-04-19 VITALS — BP 136/78 | HR 90 | Temp 96.7°F | Resp 18 | Ht 69.0 in | Wt 127.0 lb

## 2013-04-19 DIAGNOSIS — C7951 Secondary malignant neoplasm of bone: Secondary | ICD-10-CM

## 2013-04-19 DIAGNOSIS — E871 Hypo-osmolality and hyponatremia: Secondary | ICD-10-CM

## 2013-04-19 DIAGNOSIS — C321 Malignant neoplasm of supraglottis: Secondary | ICD-10-CM

## 2013-04-19 DIAGNOSIS — G620 Drug-induced polyneuropathy: Secondary | ICD-10-CM | POA: Insufficient documentation

## 2013-04-19 DIAGNOSIS — E039 Hypothyroidism, unspecified: Secondary | ICD-10-CM

## 2013-04-19 DIAGNOSIS — G629 Polyneuropathy, unspecified: Secondary | ICD-10-CM

## 2013-04-19 DIAGNOSIS — Z5111 Encounter for antineoplastic chemotherapy: Secondary | ICD-10-CM

## 2013-04-19 DIAGNOSIS — K5900 Constipation, unspecified: Secondary | ICD-10-CM

## 2013-04-19 DIAGNOSIS — E875 Hyperkalemia: Secondary | ICD-10-CM

## 2013-04-19 DIAGNOSIS — G609 Hereditary and idiopathic neuropathy, unspecified: Secondary | ICD-10-CM

## 2013-04-19 HISTORY — DX: Polyneuropathy, unspecified: G62.9

## 2013-04-19 HISTORY — DX: Hypo-osmolality and hyponatremia: E87.1

## 2013-04-19 LAB — CBC WITH DIFFERENTIAL/PLATELET
BASO%: 0.7 % (ref 0.0–2.0)
Basophils Absolute: 0 10*3/uL (ref 0.0–0.1)
EOS%: 1.1 % (ref 0.0–7.0)
Eosinophils Absolute: 0.1 10*3/uL (ref 0.0–0.5)
HCT: 30.6 % — ABNORMAL LOW (ref 38.4–49.9)
LYMPH%: 16.7 % (ref 14.0–49.0)
MCH: 30.8 pg (ref 27.2–33.4)
MCHC: 33.7 g/dL (ref 32.0–36.0)
MCV: 91.6 fL (ref 79.3–98.0)
MONO#: 0.2 10*3/uL (ref 0.1–0.9)
MONO%: 5.4 % (ref 0.0–14.0)
NEUT%: 76.1 % — ABNORMAL HIGH (ref 39.0–75.0)
Platelets: 203 10*3/uL (ref 140–400)
RBC: 3.34 10*6/uL — ABNORMAL LOW (ref 4.20–5.82)
WBC: 4.5 10*3/uL (ref 4.0–10.3)
nRBC: 0 % (ref 0–0)

## 2013-04-19 LAB — COMPREHENSIVE METABOLIC PANEL (CC13)
ALT: 12 U/L (ref 0–55)
AST: 21 U/L (ref 5–34)
Alkaline Phosphatase: 61 U/L (ref 40–150)
Anion Gap: 8 mEq/L (ref 3–11)
BUN: 18.3 mg/dL (ref 7.0–26.0)
Creatinine: 1 mg/dL (ref 0.7–1.3)
Glucose: 89 mg/dl (ref 70–140)
Potassium: 4.1 mEq/L (ref 3.5–5.1)
Total Bilirubin: 0.47 mg/dL (ref 0.20–1.20)

## 2013-04-19 MED ORDER — ONDANSETRON 16 MG/50ML IVPB (CHCC)
INTRAVENOUS | Status: AC
  Filled 2013-04-19: qty 16

## 2013-04-19 MED ORDER — DEXAMETHASONE SODIUM PHOSPHATE 20 MG/5ML IJ SOLN
20.0000 mg | Freq: Once | INTRAMUSCULAR | Status: AC
  Administered 2013-04-19: 20 mg via INTRAVENOUS

## 2013-04-19 MED ORDER — SODIUM CHLORIDE 0.9 % IJ SOLN
10.0000 mL | INTRAMUSCULAR | Status: DC | PRN
  Administered 2013-04-19: 10 mL
  Filled 2013-04-19: qty 10

## 2013-04-19 MED ORDER — ONDANSETRON 16 MG/50ML IVPB (CHCC)
16.0000 mg | Freq: Once | INTRAVENOUS | Status: AC
  Administered 2013-04-19: 16 mg via INTRAVENOUS

## 2013-04-19 MED ORDER — SODIUM CHLORIDE 0.9 % IV SOLN
120.0000 mg | Freq: Once | INTRAVENOUS | Status: AC
  Administered 2013-04-19: 120 mg via INTRAVENOUS
  Filled 2013-04-19: qty 12

## 2013-04-19 MED ORDER — PACLITAXEL CHEMO INJECTION 300 MG/50ML
80.0000 mg/m2 | Freq: Once | INTRAVENOUS | Status: AC
  Administered 2013-04-19: 132 mg via INTRAVENOUS
  Filled 2013-04-19: qty 22

## 2013-04-19 MED ORDER — DIPHENHYDRAMINE HCL 50 MG/ML IJ SOLN
12.5000 mg | Freq: Once | INTRAMUSCULAR | Status: AC
  Administered 2013-04-19: 12.5 mg via INTRAVENOUS

## 2013-04-19 MED ORDER — SODIUM CHLORIDE 0.9 % IV SOLN
Freq: Once | INTRAVENOUS | Status: AC
  Administered 2013-04-19: 10:00:00 via INTRAVENOUS

## 2013-04-19 MED ORDER — FAMOTIDINE IN NACL 20-0.9 MG/50ML-% IV SOLN
INTRAVENOUS | Status: AC
  Filled 2013-04-19: qty 50

## 2013-04-19 MED ORDER — DIPHENHYDRAMINE HCL 50 MG/ML IJ SOLN
INTRAMUSCULAR | Status: AC
  Filled 2013-04-19: qty 1

## 2013-04-19 MED ORDER — DEXAMETHASONE SODIUM PHOSPHATE 20 MG/5ML IJ SOLN
INTRAMUSCULAR | Status: AC
  Filled 2013-04-19: qty 5

## 2013-04-19 MED ORDER — HEPARIN SOD (PORK) LOCK FLUSH 100 UNIT/ML IV SOLN
500.0000 [IU] | Freq: Once | INTRAVENOUS | Status: AC | PRN
  Administered 2013-04-19: 500 [IU]
  Filled 2013-04-19: qty 5

## 2013-04-19 MED ORDER — FAMOTIDINE IN NACL 20-0.9 MG/50ML-% IV SOLN
20.0000 mg | Freq: Once | INTRAVENOUS | Status: AC
  Administered 2013-04-19: 20 mg via INTRAVENOUS

## 2013-04-19 NOTE — Telephone Encounter (Signed)
Gave pt appt for lab,md and chemo for November and December 2014 °

## 2013-04-19 NOTE — Progress Notes (Signed)
Met with pt briefly after his scheduled infusion.  He indicated he was doing well, appeared to be in good spirits.  Continuing to navigate as L2 (treatments established) patient.  Young Berry, RN, BSN, [facility] Head & Neck Oncology Navigator [phone]

## 2013-04-19 NOTE — Progress Notes (Signed)
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     07/09/2012 Initial Diagnosis Carcinoma of supraglottis from biopsy, HPV negative   08/13/2012 Surgery He underwent laryngectomy and bilateral LN dissection. There were bilateral LN involvement and 10/25 LN were positive with extracapsular extension staging T3N2Mx   09/20/2012 - 10/29/2012 Chemotherapy he received concurrent chemo/Rt with weekly cisplatin   02/23/2013 Imaging Staging PET/Ct scan showed focal hypermetabolic bone lesions from metastatic cancer   03/09/2013 Procedure Placement of infusaport under IR   03/15/2013 -  Chemotherapy Start cycle 1 palliative chemotherapy with Weekly Carboplatin & Paclitacel at mild dosage adjustment due to elevated creatinine    INTERVAL HISTORY: LATONYA KNIGHT 67 y.o. male returns for further followup. Recently, I increase his thyroid supplement. He complained off persistent neuropathy but is not getting worse. He still has persistent constipation, resolved with laxatives. He denies any nausea.   I have reviewed the past medical history, past surgical history, social history and family history with the patient and they are unchanged from previous note.  ALLERGIES:  has No  Known Allergies.  MEDICATIONS:  Current Outpatient Prescriptions  Medication Sig Dispense Refill  . acetaminophen (TYLENOL) 325 MG tablet Take 650 mg by mouth daily as needed for pain.      Marland Kitchen HYDROcodone-acetaminophen (NORCO) 5-325 MG per tablet Take 1-2 tablets by mouth every 4 (four) hours as needed for pain.  25 tablet  1  . levothyroxine (SYNTHROID, LEVOTHROID) 50 MCG tablet Take 1 tablet (50 mcg total) by mouth daily before breakfast.  30 tablet  3  . Multiple Vitamin (MULTIVITAMIN) tablet Take 1 tablet by mouth daily.      . ondansetron (ZOFRAN) 8 MG tablet Take 1 tablet (8 mg total) by mouth every 8 (eight) hours as needed for nausea.  30 tablet  1  . promethazine (PHENERGAN) 25 MG suppository Place 1 suppository (25 mg total) rectally every 6 (six) hours as needed for nausea.  12 each  0  . senna (SENOKOT) 8.6 MG tablet Take 2 tablets (17.2 mg total) by mouth 2 (two) times daily.  60 tablet  3  . polyethylene glycol powder (GLYCOLAX/MIRALAX) powder        No current facility-administered medications for this visit.   Facility-Administered Medications Ordered in Other Visits  Medication Dose Route Frequency Provider Last Rate Last Dose  . CARBOplatin (PARAPLATIN) 120 mg in sodium chloride 0.9 % 100 mL chemo infusion  120 mg Intravenous Once Artis Delay, MD      . famotidine (PEPCID) IVPB 20 mg  20 mg Intravenous Once Artis Delay, MD   20 mg at 04/19/13 1012  . heparin lock flush 100 unit/mL  500 Units Intracatheter Once PRN Artis Delay, MD      . PACLitaxel (TAXOL) 132 mg in dextrose  5 % 250 mL chemo infusion (</= 80mg /m2)  80 mg/m2 (Treatment Plan Actual) Intravenous Once Artis Delay, MD      . sodium chloride 0.9 % injection 10 mL  10 mL Intracatheter PRN Artis Delay, MD   10 mL at 03/15/13 1808  . sodium chloride 0.9 % injection 10 mL  10 mL Intracatheter PRN Artis Delay, MD        REVIEW OF SYSTEMS:   Constitutional: Denies fevers, chills or abnormal weight loss Eyes: Denies  blurriness of vision Ears, nose, mouth, throat, and face: Denies mucositis or sore throat Respiratory: Denies cough, dyspnea or wheezes Cardiovascular: Denies palpitation, chest discomfort or lower extremity swelling Gastrointestinal:  Denies nausea, heartburn or change in bowel habits Skin: Denies abnormal skin rashes Behavioral/Psych: Mood is stable, no new changes  All other systems were reviewed with the patient and are negative.  PHYSICAL EXAMINATION: ECOG PERFORMANCE STATUS: 1 - Symptomatic but completely ambulatory  Filed Vitals:   04/19/13 0908  BP: 136/78  Pulse: 90  Temp: 96.7 F (35.9 C)  Resp: 18   Filed Weights   04/19/13 0908  Weight: 127 lb (57.607 kg)    GENERAL:alert, no distress and comfortable. He looks mildly thin but not cachectic SKIN: skin color, texture, turgor are normal, no rashes or significant lesions EYES: normal, Conjunctiva are pink and non-injected, sclera clear OROPHARYNX:no exudate, no erythema and lips, buccal mucosa, and tongue normal . The trach site looks okay NECK: supple, thyroid normal size, non-tender, without nodularity LYMPH:  no palpable lymphadenopathy in the cervical, axillary or inguinal LUNGS: clear to auscultation and percussion with normal breathing effort HEART: regular rate & rhythm and no murmurs and no lower extremity edema ABDOMEN:abdomen soft, non-tender and normal bowel sounds Musculoskeletal:no cyanosis of digits and no clubbing  NEURO: alert & oriented x 3, no focal motor/sensory deficits  LABORATORY DATA:  I have reviewed the data as listed    Component Value Date/Time   NA 133* 04/12/2013 0905   NA 136 08/19/2012 0625   K 4.2 04/12/2013 0905   K 4.3 08/19/2012 0625   CL 98 11/15/2012 0941   CL 100 08/19/2012 0625   CO2 23 04/12/2013 0905   CO2 28 08/19/2012 0625   GLUCOSE 89 04/12/2013 0905   GLUCOSE 90 11/15/2012 0941   GLUCOSE 109* 08/19/2012 0625   BUN 13.2 04/12/2013 0905   BUN 9 08/19/2012 0625   CREATININE 1.4*  04/12/2013 0905   CREATININE 0.69 08/19/2012 0625   CALCIUM 9.6 04/12/2013 0905   CALCIUM 8.3* 08/20/2012 0542   PROT 8.5* 04/12/2013 0905   PROT 6.9 08/19/2012 0625   ALBUMIN 3.3* 04/12/2013 0905   ALBUMIN 2.4* 08/19/2012 0625   AST 18 04/12/2013 0905   AST 25 08/19/2012 0625   ALT 12 04/12/2013 0905   ALT 20 08/19/2012 0625   ALKPHOS 63 04/12/2013 0905   ALKPHOS 57 08/19/2012 0625   BILITOT 0.44 04/12/2013 0905   BILITOT 0.6 08/19/2012 0625   GFRNONAA >90 08/19/2012 0625   GFRAA >90 08/19/2012 0625    No results found for this basename: SPEP,  UPEP,   kappa and lambda light chains    Lab Results  Component Value Date   WBC 4.5 04/19/2013   NEUTROABS 3.4 04/19/2013   HGB 10.3* 04/19/2013   HCT 30.6* 04/19/2013   MCV 91.6 04/19/2013   PLT 203 04/19/2013      Chemistry      Component Value Date/Time   NA 133* 04/12/2013 6295  NA 136 08/19/2012 0625   K 4.2 04/12/2013 0905   K 4.3 08/19/2012 0625   CL 98 11/15/2012 0941   CL 100 08/19/2012 0625   CO2 23 04/12/2013 0905   CO2 28 08/19/2012 0625   BUN 13.2 04/12/2013 0905   BUN 9 08/19/2012 0625   CREATININE 1.4* 04/12/2013 0905   CREATININE 0.69 08/19/2012 0625      Component Value Date/Time   CALCIUM 9.6 04/12/2013 0905   CALCIUM 8.3* 08/20/2012 0542   ALKPHOS 63 04/12/2013 0905   ALKPHOS 57 08/19/2012 0625   AST 18 04/12/2013 0905   AST 25 08/19/2012 0625   ALT 12 04/12/2013 0905   ALT 20 08/19/2012 0625   BILITOT 0.44 04/12/2013 0905   BILITOT 0.6 08/19/2012 0625     ASSESSMENT & PLAN:  #1 metastatic laryngeal cancer I recommend we continue treatment with mild dosage adjustment. I plan to restage him after 3 cycles of chemotherapy to assess response to treatment. PET CT scan has been scheduled for the end of the month. #2 mild peripheral neuropathy This is not unexpected side effects from chemotherapy. It is only mild, grade 1. We will observe for now without dosage adjustment #3 constipation He could be related to under treatment of his  hypothyroidism. I recommend he take Senokot regularly  #4 significant hypothyroidism I have increased his dose recently. We will recheck thyroid function test in 2 months. #5 anemia This is likely due to recent treatment. The patient denies recent history of bleeding such as epistaxis, hematuria or hematochezia. He is asymptomatic from the anemia. I will observe for now.  he does not require transfusion now. I will continue the chemotherapy at current dose without dosage adjustment.  If the anemia gets progressive worse in the future, I might have to delay his treatment or adjust the chemotherapy dose. #6 mildly elevated creatinine function His chemotherapy doses has been adjusted because of this. I continue to encourage patient to increase oral intake #7 bony metastasis I will hold off giving him IV bisphosphonates to reduce the risk of osteonecrosis of the jaw due to recent radiation treatment. The patient is asymptomatic at this point in time. #8 hyponatremia This is likely due to reduced salt intake and hypothyroidism. It is stable. We will observe. No orders of the defined types were placed in this encounter.   All questions were answered. The patient knows to call the clinic with any problems, questions or concerns. No barriers to learning was detected.    Cadey Bazile, MD 04/19/2013 10:18 AM

## 2013-04-19 NOTE — Telephone Encounter (Signed)
Per staff message and POF I have scheduled appts.  JMW  

## 2013-04-19 NOTE — Patient Instructions (Signed)
[facility] Discharge Instructions for Patients Receiving Chemotherapy  Today you received the following chemotherapy agents: Taxol and Carboplatin.   To help prevent nausea and vomiting after your treatment, we encourage you to take your nausea medication as directed.    If you develop nausea and vomiting that is not controlled by your nausea medication, call the clinic.   BELOW ARE SYMPTOMS THAT SHOULD BE REPORTED IMMEDIATELY:  *FEVER GREATER THAN 100.5 F  *CHILLS WITH OR WITHOUT FEVER  NAUSEA AND VOMITING THAT IS NOT CONTROLLED WITH YOUR NAUSEA MEDICATION  *UNUSUAL SHORTNESS OF BREATH  *UNUSUAL BRUISING OR BLEEDING  TENDERNESS IN MOUTH AND THROAT WITH OR WITHOUT PRESENCE OF ULCERS  *URINARY PROBLEMS  *BOWEL PROBLEMS  UNUSUAL RASH Items with * indicate a potential emergency and should be followed up as soon as possible.  Feel free to call the clinic you have any questions or concerns. The clinic phone number is [phone].    

## 2013-04-20 ENCOUNTER — Other Ambulatory Visit: Payer: Self-pay | Admitting: Hematology and Oncology

## 2013-04-20 ENCOUNTER — Ambulatory Visit: Payer: Self-pay | Admitting: Hematology and Oncology

## 2013-04-20 ENCOUNTER — Other Ambulatory Visit: Payer: Self-pay

## 2013-04-20 DIAGNOSIS — C321 Malignant neoplasm of supraglottis: Secondary | ICD-10-CM

## 2013-04-21 ENCOUNTER — Telehealth: Payer: Self-pay | Admitting: Hematology and Oncology

## 2013-04-21 NOTE — Telephone Encounter (Signed)
, °

## 2013-04-26 ENCOUNTER — Other Ambulatory Visit (HOSPITAL_BASED_OUTPATIENT_CLINIC_OR_DEPARTMENT_OTHER): Payer: Medicare Other | Admitting: Lab

## 2013-04-26 ENCOUNTER — Ambulatory Visit (HOSPITAL_BASED_OUTPATIENT_CLINIC_OR_DEPARTMENT_OTHER): Payer: Medicare Other

## 2013-04-26 VITALS — BP 127/75 | HR 78 | Temp 97.3°F | Resp 19 | Ht 69.0 in

## 2013-04-26 DIAGNOSIS — C7951 Secondary malignant neoplasm of bone: Secondary | ICD-10-CM

## 2013-04-26 DIAGNOSIS — C321 Malignant neoplasm of supraglottis: Secondary | ICD-10-CM

## 2013-04-26 DIAGNOSIS — Z5111 Encounter for antineoplastic chemotherapy: Secondary | ICD-10-CM

## 2013-04-26 LAB — COMPREHENSIVE METABOLIC PANEL (CC13)
ALT: 10 U/L (ref 0–55)
AST: 21 U/L (ref 5–34)
Albumin: 3.6 g/dL (ref 3.5–5.0)
Alkaline Phosphatase: 64 U/L (ref 40–150)
Anion Gap: 9 mEq/L (ref 3–11)
BUN: 14.8 mg/dL (ref 7.0–26.0)
CO2: 24 mEq/L (ref 22–29)
Calcium: 9.7 mg/dL (ref 8.4–10.4)
Chloride: 101 mEq/L (ref 98–109)
Creatinine: 1.1 mg/dL (ref 0.7–1.3)
Glucose: 100 mg/dl (ref 70–140)
Potassium: 4.1 mEq/L (ref 3.5–5.1)
Sodium: 134 mEq/L — ABNORMAL LOW (ref 136–145)
Total Bilirubin: 0.82 mg/dL (ref 0.20–1.20)
Total Protein: 8.5 g/dL — ABNORMAL HIGH (ref 6.4–8.3)

## 2013-04-26 LAB — CBC WITH DIFFERENTIAL/PLATELET
BASO%: 1.4 % (ref 0.0–2.0)
Basophils Absolute: 0 10*3/uL (ref 0.0–0.1)
EOS%: 1.7 % (ref 0.0–7.0)
Eosinophils Absolute: 0.1 10*3/uL (ref 0.0–0.5)
HCT: 30.6 % — ABNORMAL LOW (ref 38.4–49.9)
HGB: 10.5 g/dL — ABNORMAL LOW (ref 13.0–17.1)
LYMPH%: 27.8 % (ref 14.0–49.0)
MCH: 30.9 pg (ref 27.2–33.4)
MCHC: 34.3 g/dL (ref 32.0–36.0)
MCV: 90 fL (ref 79.3–98.0)
MONO#: 0.2 10*3/uL (ref 0.1–0.9)
MONO%: 5.1 % (ref 0.0–14.0)
NEUT#: 1.9 10*3/uL (ref 1.5–6.5)
NEUT%: 64 % (ref 39.0–75.0)
Platelets: 213 10*3/uL (ref 140–400)
RBC: 3.4 10*6/uL — ABNORMAL LOW (ref 4.20–5.82)
RDW: 16.4 % — ABNORMAL HIGH (ref 11.0–14.6)
WBC: 3 10*3/uL — ABNORMAL LOW (ref 4.0–10.3)
lymph#: 0.8 10*3/uL — ABNORMAL LOW (ref 0.9–3.3)
nRBC: 0 % (ref 0–0)

## 2013-04-26 MED ORDER — SODIUM CHLORIDE 0.9 % IV SOLN
120.0000 mg | Freq: Once | INTRAVENOUS | Status: AC
  Administered 2013-04-26: 120 mg via INTRAVENOUS
  Filled 2013-04-26: qty 12

## 2013-04-26 MED ORDER — HEPARIN SOD (PORK) LOCK FLUSH 100 UNIT/ML IV SOLN
500.0000 [IU] | Freq: Once | INTRAVENOUS | Status: AC | PRN
  Administered 2013-04-26: 500 [IU]
  Filled 2013-04-26: qty 5

## 2013-04-26 MED ORDER — DEXAMETHASONE SODIUM PHOSPHATE 20 MG/5ML IJ SOLN
20.0000 mg | Freq: Once | INTRAMUSCULAR | Status: AC
  Administered 2013-04-26: 20 mg via INTRAVENOUS

## 2013-04-26 MED ORDER — SODIUM CHLORIDE 0.9 % IV SOLN
Freq: Once | INTRAVENOUS | Status: AC
  Administered 2013-04-26: 11:00:00 via INTRAVENOUS

## 2013-04-26 MED ORDER — FAMOTIDINE IN NACL 20-0.9 MG/50ML-% IV SOLN
INTRAVENOUS | Status: AC
  Filled 2013-04-26: qty 50

## 2013-04-26 MED ORDER — DIPHENHYDRAMINE HCL 50 MG/ML IJ SOLN
12.5000 mg | Freq: Once | INTRAMUSCULAR | Status: AC
  Administered 2013-04-26: 12.5 mg via INTRAVENOUS

## 2013-04-26 MED ORDER — DEXAMETHASONE SODIUM PHOSPHATE 20 MG/5ML IJ SOLN
INTRAMUSCULAR | Status: AC
  Filled 2013-04-26: qty 5

## 2013-04-26 MED ORDER — SODIUM CHLORIDE 0.9 % IJ SOLN
10.0000 mL | INTRAMUSCULAR | Status: DC | PRN
  Administered 2013-04-26: 10 mL
  Filled 2013-04-26: qty 10

## 2013-04-26 MED ORDER — FAMOTIDINE IN NACL 20-0.9 MG/50ML-% IV SOLN
20.0000 mg | Freq: Once | INTRAVENOUS | Status: AC
  Administered 2013-04-26: 20 mg via INTRAVENOUS

## 2013-04-26 MED ORDER — ONDANSETRON 16 MG/50ML IVPB (CHCC)
INTRAVENOUS | Status: AC
  Filled 2013-04-26: qty 16

## 2013-04-26 MED ORDER — ONDANSETRON 16 MG/50ML IVPB (CHCC)
16.0000 mg | Freq: Once | INTRAVENOUS | Status: AC
  Administered 2013-04-26: 16 mg via INTRAVENOUS

## 2013-04-26 MED ORDER — DIPHENHYDRAMINE HCL 50 MG/ML IJ SOLN
INTRAMUSCULAR | Status: AC
  Filled 2013-04-26: qty 1

## 2013-04-26 MED ORDER — PACLITAXEL CHEMO INJECTION 300 MG/50ML
80.0000 mg/m2 | Freq: Once | INTRAVENOUS | Status: AC
  Administered 2013-04-26: 132 mg via INTRAVENOUS
  Filled 2013-04-26: qty 22

## 2013-04-26 NOTE — Patient Instructions (Signed)
[facility] Discharge Instructions for Patients Receiving Chemotherapy  Today you received the following chemotherapy agents: Carboplatin, Taxol   To help prevent nausea and vomiting after your treatment, we encourage you to take your nausea medication as prescribed.    If you develop nausea and vomiting that is not controlled by your nausea medication, call the clinic.   BELOW ARE SYMPTOMS THAT SHOULD BE REPORTED IMMEDIATELY:  *FEVER GREATER THAN 100.5 F  *CHILLS WITH OR WITHOUT FEVER  NAUSEA AND VOMITING THAT IS NOT CONTROLLED WITH YOUR NAUSEA MEDICATION  *UNUSUAL SHORTNESS OF BREATH  *UNUSUAL BRUISING OR BLEEDING  TENDERNESS IN MOUTH AND THROAT WITH OR WITHOUT PRESENCE OF ULCERS  *URINARY PROBLEMS  *BOWEL PROBLEMS  UNUSUAL RASH Items with * indicate a potential emergency and should be followed up as soon as possible.  Feel free to call the clinic you have any questions or concerns. The clinic phone number is [phone].    

## 2013-05-03 ENCOUNTER — Ambulatory Visit (HOSPITAL_COMMUNITY)
Admission: RE | Admit: 2013-05-03 | Discharge: 2013-05-03 | Disposition: A | Discharge status: Home / Self Care | Payer: Medicare Other | Source: Ambulatory Visit | Attending: Hematology and Oncology | Admitting: Hematology and Oncology

## 2013-05-03 ENCOUNTER — Encounter (HOSPITAL_COMMUNITY): Payer: Self-pay

## 2013-05-03 DIAGNOSIS — C7951 Secondary malignant neoplasm of bone: Secondary | ICD-10-CM | POA: Insufficient documentation

## 2013-05-03 DIAGNOSIS — C321 Malignant neoplasm of supraglottis: Secondary | ICD-10-CM

## 2013-05-03 DIAGNOSIS — C329 Malignant neoplasm of larynx, unspecified: Secondary | ICD-10-CM | POA: Insufficient documentation

## 2013-05-03 LAB — GLUCOSE, CAPILLARY: Glucose-Capillary: 97 mg/dL (ref 70–99)

## 2013-05-03 MED ORDER — FLUDEOXYGLUCOSE F - 18 (FDG) INJECTION
17.5000 | Freq: Once | INTRAVENOUS | Status: AC | PRN
  Administered 2013-05-03: 17.5 via INTRAVENOUS

## 2013-05-09 ENCOUNTER — Encounter (HOSPITAL_COMMUNITY): Payer: Self-pay | Admitting: Dentistry

## 2013-05-09 ENCOUNTER — Ambulatory Visit (HOSPITAL_COMMUNITY): Payer: Self-pay | Admitting: Dentistry

## 2013-05-09 VITALS — BP 110/66 | HR 82 | Temp 98.2°F

## 2013-05-09 DIAGNOSIS — K1370 Unspecified lesions of oral mucosa: Secondary | ICD-10-CM

## 2013-05-09 DIAGNOSIS — Z463 Encounter for fitting and adjustment of dental prosthetic device: Secondary | ICD-10-CM

## 2013-05-09 DIAGNOSIS — K08109 Complete loss of teeth, unspecified cause, unspecified class: Secondary | ICD-10-CM

## 2013-05-09 DIAGNOSIS — K062 Gingival and edentulous alveolar ridge lesions associated with trauma: Secondary | ICD-10-CM

## 2013-05-09 NOTE — Patient Instructions (Signed)
Patient to keep dentures out if sore spots develop. Use salt water rinses as needed to aid healing. Return to clinic as scheduled for denture adjustment.   Call if problems arise before then. Patient was specifically instructed to let his daughter or son know if denture irritation persists over the mid palatal torus. Patient will be scheduled earlier if needed. Dr.Daoud Lobue

## 2013-05-09 NOTE — Progress Notes (Signed)
05/09/2013  Patient:            Cody [name] Date of Birth:  Dec 12, 1945 MRN:                [id number]  BP 110/66  Pulse 82  Temp(Src) 98.2 F (36.8 C) (Oral)   Cody [name] presents for evaluation of recently inserted upper and lower complete dentures.  SUBJECTIVE: Patient is complaining of tenderness to the mid-palate area where the palatal torus is located. OBJECTIVE: There is no sign of denture ulceration or erythema. Mid-palatal torus noted. Procedure: Pressure indicating paste applied to dentures. Adjustments made as needed. Area over the torus was adjusted. [place]. Occlusion evaluated and adjustments made as needed for Centric Relation and protrusive strokes. Patient accepts results.  Patient to keep dentures out if sore spots develop. Use salt water rinses as needed to aid healing. Return to clinic as scheduled for denture adjustment.  Call if problems arise before then. Patient dismissed in stable condition.  Charlynne Pander, DDS

## 2013-05-10 ENCOUNTER — Encounter: Payer: Self-pay | Admitting: *Deleted

## 2013-05-10 ENCOUNTER — Telehealth: Payer: Self-pay | Admitting: Hematology and Oncology

## 2013-05-10 ENCOUNTER — Telehealth: Payer: Self-pay | Admitting: *Deleted

## 2013-05-10 ENCOUNTER — Ambulatory Visit (HOSPITAL_BASED_OUTPATIENT_CLINIC_OR_DEPARTMENT_OTHER): Payer: Medicare Other

## 2013-05-10 ENCOUNTER — Encounter: Payer: Self-pay | Admitting: Hematology and Oncology

## 2013-05-10 ENCOUNTER — Ambulatory Visit: Payer: Medicare Other | Admitting: Lab

## 2013-05-10 ENCOUNTER — Other Ambulatory Visit (HOSPITAL_BASED_OUTPATIENT_CLINIC_OR_DEPARTMENT_OTHER): Payer: Medicare Other | Admitting: Lab

## 2013-05-10 ENCOUNTER — Ambulatory Visit (HOSPITAL_BASED_OUTPATIENT_CLINIC_OR_DEPARTMENT_OTHER): Payer: Medicare Other | Admitting: Hematology and Oncology

## 2013-05-10 ENCOUNTER — Other Ambulatory Visit: Payer: Self-pay | Admitting: Hematology and Oncology

## 2013-05-10 VITALS — BP 134/65 | HR 81 | Temp 97.5°F | Resp 18 | Ht 69.0 in | Wt 122.4 lb

## 2013-05-10 DIAGNOSIS — D649 Anemia, unspecified: Secondary | ICD-10-CM

## 2013-05-10 DIAGNOSIS — G609 Hereditary and idiopathic neuropathy, unspecified: Secondary | ICD-10-CM

## 2013-05-10 DIAGNOSIS — R300 Dysuria: Secondary | ICD-10-CM

## 2013-05-10 DIAGNOSIS — C321 Malignant neoplasm of supraglottis: Secondary | ICD-10-CM

## 2013-05-10 DIAGNOSIS — C7951 Secondary malignant neoplasm of bone: Secondary | ICD-10-CM

## 2013-05-10 DIAGNOSIS — E039 Hypothyroidism, unspecified: Secondary | ICD-10-CM

## 2013-05-10 DIAGNOSIS — D72819 Decreased white blood cell count, unspecified: Secondary | ICD-10-CM

## 2013-05-10 DIAGNOSIS — E871 Hypo-osmolality and hyponatremia: Secondary | ICD-10-CM

## 2013-05-10 DIAGNOSIS — Z5111 Encounter for antineoplastic chemotherapy: Secondary | ICD-10-CM

## 2013-05-10 DIAGNOSIS — G629 Polyneuropathy, unspecified: Secondary | ICD-10-CM

## 2013-05-10 HISTORY — DX: Dysuria: R30.0

## 2013-05-10 LAB — COMPREHENSIVE METABOLIC PANEL (CC13)
AST: 20 U/L (ref 5–34)
Albumin: 3.6 g/dL (ref 3.5–5.0)
BUN: 12.5 mg/dL (ref 7.0–26.0)
Calcium: 9.4 mg/dL (ref 8.4–10.4)
Chloride: 103 mEq/L (ref 98–109)
Glucose: 95 mg/dl (ref 70–140)
Potassium: 4.1 mEq/L (ref 3.5–5.1)
Sodium: 136 mEq/L (ref 136–145)
Total Bilirubin: 0.68 mg/dL (ref 0.20–1.20)
Total Protein: 8.4 g/dL — ABNORMAL HIGH (ref 6.4–8.3)

## 2013-05-10 LAB — URINALYSIS, MICROSCOPIC - CHCC
Bilirubin (Urine): NEGATIVE
Glucose: NEGATIVE mg/dL
Ketones: NEGATIVE mg/dL
Nitrite: NEGATIVE
Specific Gravity, Urine: 1.015 (ref 1.003–1.035)
Urobilinogen, UR: 0.2 mg/dL (ref 0.2–1)

## 2013-05-10 LAB — CBC WITH DIFFERENTIAL/PLATELET
BASO%: 0.6 % (ref 0.0–2.0)
EOS%: 1.2 % (ref 0.0–7.0)
Eosinophils Absolute: 0 10*3/uL (ref 0.0–0.5)
MCH: 31.3 pg (ref 27.2–33.4)
MCV: 93.3 fL (ref 79.3–98.0)
MONO%: 12.2 % (ref 0.0–14.0)
NEUT#: 1.9 10*3/uL (ref 1.5–6.5)
RBC: 3.13 10*6/uL — ABNORMAL LOW (ref 4.20–5.82)
RDW: 17.7 % — ABNORMAL HIGH (ref 11.0–14.6)
lymph#: 1 10*3/uL (ref 0.9–3.3)

## 2013-05-10 MED ORDER — HEPARIN SOD (PORK) LOCK FLUSH 100 UNIT/ML IV SOLN
500.0000 [IU] | Freq: Once | INTRAVENOUS | Status: AC | PRN
  Administered 2013-05-10: 500 [IU]
  Filled 2013-05-10: qty 5

## 2013-05-10 MED ORDER — DEXAMETHASONE SODIUM PHOSPHATE 20 MG/5ML IJ SOLN
INTRAMUSCULAR | Status: AC
  Filled 2013-05-10: qty 5

## 2013-05-10 MED ORDER — FAMOTIDINE IN NACL 20-0.9 MG/50ML-% IV SOLN
INTRAVENOUS | Status: AC
  Filled 2013-05-10: qty 50

## 2013-05-10 MED ORDER — DIPHENHYDRAMINE HCL 50 MG/ML IJ SOLN
12.5000 mg | Freq: Once | INTRAMUSCULAR | Status: AC
  Administered 2013-05-10: 12.5 mg via INTRAVENOUS

## 2013-05-10 MED ORDER — AMOXICILLIN 500 MG PO TABS: 500.0000 mg | ORAL_TABLET | Freq: Two times a day (BID) | ORAL | Status: DC

## 2013-05-10 MED ORDER — DEXAMETHASONE SODIUM PHOSPHATE 20 MG/5ML IJ SOLN
20.0000 mg | Freq: Once | INTRAMUSCULAR | Status: AC
  Administered 2013-05-10: 20 mg via INTRAVENOUS

## 2013-05-10 MED ORDER — ONDANSETRON 16 MG/50ML IVPB (CHCC)
16.0000 mg | Freq: Once | INTRAVENOUS | Status: AC
  Administered 2013-05-10: 16 mg via INTRAVENOUS

## 2013-05-10 MED ORDER — PACLITAXEL CHEMO INJECTION 300 MG/50ML
64.0000 mg/m2 | Freq: Once | INTRAVENOUS | Status: AC
  Administered 2013-05-10: 108 mg via INTRAVENOUS
  Filled 2013-05-10: qty 18

## 2013-05-10 MED ORDER — DIPHENHYDRAMINE HCL 50 MG/ML IJ SOLN
INTRAMUSCULAR | Status: AC
  Filled 2013-05-10: qty 1

## 2013-05-10 MED ORDER — SODIUM CHLORIDE 0.9 % IV SOLN
120.0000 mg | Freq: Once | INTRAVENOUS | Status: AC
  Administered 2013-05-10: 120 mg via INTRAVENOUS
  Filled 2013-05-10: qty 12

## 2013-05-10 MED ORDER — ONDANSETRON 16 MG/50ML IVPB (CHCC)
INTRAVENOUS | Status: AC
  Filled 2013-05-10: qty 16

## 2013-05-10 MED ORDER — SODIUM CHLORIDE 0.9 % IJ SOLN
10.0000 mL | INTRAMUSCULAR | Status: DC | PRN
  Administered 2013-05-10: 10 mL
  Filled 2013-05-10: qty 10

## 2013-05-10 MED ORDER — SODIUM CHLORIDE 0.9 % IV SOLN
Freq: Once | INTRAVENOUS | Status: AC
  Administered 2013-05-10: 10:00:00 via INTRAVENOUS

## 2013-05-10 MED ORDER — FAMOTIDINE IN NACL 20-0.9 MG/50ML-% IV SOLN
20.0000 mg | Freq: Once | INTRAVENOUS | Status: AC
  Administered 2013-05-10: 20 mg via INTRAVENOUS

## 2013-05-10 NOTE — Telephone Encounter (Signed)
appts made per 12/2 POF MW added Txs Took AVS and CAL to pt in Rx Room shh

## 2013-05-10 NOTE — Telephone Encounter (Signed)
appts made per 12/2 POF email to MW to add tx then I will add labs and take schedule to pt in Tx area shh

## 2013-05-10 NOTE — Patient Instructions (Signed)
[facility] Discharge Instructions for Patients Receiving Chemotherapy  Today you received the following chemotherapy agents: Taxol/ Carboplatin  To help prevent nausea and vomiting after your treatment, we encourage you to take your nausea medication as needed.   If you develop nausea and vomiting that is not controlled by your nausea medication, call the clinic.   BELOW ARE SYMPTOMS THAT SHOULD BE REPORTED IMMEDIATELY:  *FEVER GREATER THAN 100.5 F  *CHILLS WITH OR WITHOUT FEVER  NAUSEA AND VOMITING THAT IS NOT CONTROLLED WITH YOUR NAUSEA MEDICATION  *UNUSUAL SHORTNESS OF BREATH  *UNUSUAL BRUISING OR BLEEDING  TENDERNESS IN MOUTH AND THROAT WITH OR WITHOUT PRESENCE OF ULCERS  *URINARY PROBLEMS  *BOWEL PROBLEMS  UNUSUAL RASH Items with * indicate a potential emergency and should be followed up as soon as possible.  Feel free to call the clinic you have any questions or concerns. The clinic phone number is [phone].    

## 2013-05-10 NOTE — Progress Notes (Signed)
Met with patient and his son while patient receiving infusion.  Pt denied any concerns/problems.  I inquired if he had gotten himself a cane per our previous conversations; he denied that he has.  His son indicated the family has been encouraging him to use a walker or cane.  Patient has not agreed.  Continuing to navigate as L2 (treatments established) patient.  Young Berry, RN, BSN, [facility] Head & Neck Oncology Navigator [phone]

## 2013-05-10 NOTE — Telephone Encounter (Signed)
Per staff message and POF I have scheduled appts.  JMW  

## 2013-05-10 NOTE — Progress Notes (Signed)
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     07/09/2012 Initial Diagnosis Carcinoma of supraglottis from biopsy, HPV negative   08/13/2012 Surgery He underwent laryngectomy and bilateral LN dissection. There were bilateral LN involvement and 10/25 LN were positive with extracapsular extension staging T3N2Mx   09/20/2012 - 10/29/2012 Chemotherapy he received concurrent chemo/Rt with weekly cisplatin   02/23/2013 Imaging Staging PET/Ct scan showed focal hypermetabolic bone lesions from metastatic cancer   03/09/2013 Procedure Placement of infusaport under IR   03/15/2013 -  Chemotherapy Start cycle 1 palliative chemotherapy with Weekly Carboplatin & Paclitacel with mild dosage adjustment due to elevated creatinine   05/03/2013 Imaging Repeat PEt/CT showed no evidence of local laryngeal squamous cell carcinoma in the neck & interval resolution of metabolic activity associated skeletal metastases.    05/10/2013 Adverse Reaction Dose of Paclitzel was reduced by 20% due to worsening neuropathy    INTERVAL HISTORY: Cody [name] 67 y.o. male returns for further followup. He complained of dysuria.He  denies any recent fever, chills, night sweats or abnormal weight loss He has very mild worsening peripheral neuropathy. Is described as numbness but not pain. His appetite stable. No recent weight loss.  I have reviewed the past medical history, past surgical history, social history and family history with the patient and they are unchanged from previous note.  ALLERGIES:  has No Known Allergies.  MEDICATIONS:  Current Outpatient Prescriptions  Medication Sig Dispense Refill  . acetaminophen (TYLENOL) 325 MG tablet Take 650 mg by mouth daily as needed for pain.      Marland Kitchen HYDROcodone-acetaminophen (NORCO) 5-325 MG per tablet Take 1-2 tablets by mouth every 4 (four) hours as needed for pain.  25 tablet  1  . levothyroxine (SYNTHROID, LEVOTHROID) 50 MCG tablet Take 1 tablet (50 mcg total) by mouth daily before breakfast.  30 tablet  3  . Multiple Vitamin (MULTIVITAMIN) tablet Take 1 tablet by mouth daily.      . ondansetron (ZOFRAN) 8 MG tablet Take 1 tablet (8 mg total) by mouth every 8 (eight) hours as needed for nausea.  30 tablet  1  . polyethylene glycol powder (GLYCOLAX/MIRALAX) powder       . promethazine (PHENERGAN) 25 MG suppository Place 1 suppository (25 mg total) rectally every 6 (six) hours as needed for nausea.  12 each  0  . senna (SENOKOT) 8.6 MG tablet Take 2 tablets (17.2 mg total) by mouth 2 (two) times daily.  60 tablet  3   No current facility-administered medications for this visit.   Facility-Administered Medications Ordered in Other Visits  Medication Dose Route Frequency Provider Last Rate Last Dose  . sodium chloride 0.9 % injection 10 mL  10 mL Intracatheter PRN Artis Delay, MD  10 mL at 03/15/13 1808    REVIEW OF SYSTEMS:   Eyes: Denies blurriness of vision Ears, nose, mouth, throat, and face: Denies mucositis or sore throat Respiratory: Denies cough, dyspnea or wheezes Cardiovascular: Denies palpitation, chest discomfort or lower extremity  swelling Gastrointestinal:  Denies nausea, heartburn or change in bowel habits Skin: Denies abnormal skin rashes Lymphatics: Denies new lymphadenopathy or easy bruising  Behavioral/Psych: Mood is stable, no new changes  All other systems were reviewed with the patient and are negative.  PHYSICAL EXAMINATION: ECOG PERFORMANCE STATUS: 1 - Symptomatic but completely ambulatory  Filed Vitals:   05/10/13 0908  BP: 134/65  Pulse: 81  Temp: 97.5 F (36.4 C)  Resp: 18   Filed Weights   05/10/13 0908  Weight: 122 lb 6.4 oz (55.52 kg)    GENERAL:alert, no distress and comfortable. he looked mildly thin and cachectic SKIN: skin color, texture, turgor are normal, no rashes or significant lesions EYES: normal, Conjunctiva are pink and non-injected, sclera clear OROPHARYNX:no exudate, no erythema and lips, buccal mucosa, and tongue normal  NECK: Radiation-induced fibrosis with no abnormal nodularity LYMPH:  no palpable lymphadenopathy in the cervical, axillary or inguinal LUNGS: clear to auscultation and percussion with normal breathing effort HEART: regular rate & rhythm and no murmurs and no lower extremity edema ABDOMEN:abdomen soft, non-tender and normal bowel sounds Musculoskeletal:no cyanosis of digits and no clubbing  NEURO: alert & oriented x 3 with fluent speech, no focal motor/sensory deficits  LABORATORY DATA:  I have reviewed the data as listed    Component Value Date/Time   NA 134* 04/26/2013 0948   NA 136 08/19/2012 0625   K 4.1 04/26/2013 0948   K 4.3 08/19/2012 0625   CL 98 11/15/2012 0941   CL 100 08/19/2012 0625   CO2 24 04/26/2013 0948   CO2 28 08/19/2012 0625   GLUCOSE 100 04/26/2013 0948   GLUCOSE 90 11/15/2012 0941   GLUCOSE 109* 08/19/2012 0625   BUN 14.8 04/26/2013 0948   BUN 9 08/19/2012 0625   CREATININE 1.1 04/26/2013 0948   CREATININE 0.69 08/19/2012 0625   CALCIUM 9.7 04/26/2013 0948   CALCIUM 8.3* 08/20/2012 0542   PROT 8.5* 04/26/2013 0948   PROT 6.9  08/19/2012 0625   ALBUMIN 3.6 04/26/2013 0948   ALBUMIN 2.4* 08/19/2012 0625   AST 21 04/26/2013 0948   AST 25 08/19/2012 0625   ALT 10 04/26/2013 0948   ALT 20 08/19/2012 0625   ALKPHOS 64 04/26/2013 0948   ALKPHOS 57 08/19/2012 0625   BILITOT 0.82 04/26/2013 0948   BILITOT 0.6 08/19/2012 0625   GFRNONAA >90 08/19/2012 0625   GFRAA >90 08/19/2012 0625    No results found for this basename: SPEP,  UPEP,   kappa and lambda light chains    Lab Results  Component Value Date   WBC 3.3* 05/10/2013   NEUTROABS 1.9 05/10/2013   HGB 9.8* 05/10/2013   HCT 29.2* 05/10/2013   MCV 93.3 05/10/2013   PLT 196 05/10/2013      Chemistry      Component Value Date/Time   NA 134* 04/26/2013 0948   NA 136 08/19/2012 0625   K 4.1 04/26/2013 0948   K 4.3 08/19/2012 0625   CL 98 11/15/2012 0941   CL 100 08/19/2012 0625   CO2 24 04/26/2013 0948   CO2 28 08/19/2012 0625   BUN 14.8 04/26/2013 0948   BUN 9 08/19/2012 0625   CREATININE 1.1 04/26/2013 0948   CREATININE 0.69 08/19/2012  1610      Component Value Date/Time   CALCIUM 9.7 04/26/2013 0948   CALCIUM 8.3* 08/20/2012 0542   ALKPHOS 64 04/26/2013 0948   ALKPHOS 57 08/19/2012 0625   AST 21 04/26/2013 0948   AST 25 08/19/2012 0625   ALT 10 04/26/2013 0948   ALT 20 08/19/2012 0625   BILITOT 0.82 04/26/2013 0948   BILITOT 0.6 08/19/2012 0625     RADIOGRAPHIC STUDIES: I have personally reviewed the radiological images as listed and agreed with the findings in the report. I reviewed the PET/CT scan with him in the summer show positive response to treatment   ASSESSMENT & PLAN:  #1 metastatic laryngeal cancer I recommend we continue treatment with further mild dosage adjustment. PET/CT scan showed great response to treatment. I recommend we gave him total of 6 cycles of treatment before we repeat another PET/CT scan. He agreed. #2 mild peripheral neuropathy This is not unexpected side effects from chemotherapy.  I recommend we reduced to paclitaxel go by  20% due to this. #3 leukopenia This is likely due to recent treatment. The patient denies recent history of fevers, cough, chills, diarrhea or dysuria. He is asymptomatic from the leukopenia. I will observe for now.  I will continue the chemotherapy at current dose without dosage adjustment.  If the leukopenia gets progressive worse in the future, I might have to delay his treatment or adjust the chemotherapy dose. #4 significant hypothyroidism I have increased his dose recently. We will recheck thyroid function test in 2 months. #5 anemia This is likely due to recent treatment. The patient denies recent history of bleeding such as epistaxis, hematuria or hematochezia. He is asymptomatic from the anemia. I will observe for now.  he does not require transfusion now. I will continue the chemotherapy at current dose without dosage adjustment.  If the anemia gets progressive worse in the future, I might have to delay his treatment or adjust the chemotherapy dose. #6 mildly elevated creatinine function His chemotherapy doses has been adjusted because of this. I continue to encourage patient to increase oral intake #7 bony metastasis I will hold off giving him IV bisphosphonates to reduce the risk of osteonecrosis of the jaw due to recent radiation treatment. The patient is asymptomatic at this point in time. #8 hyponatremia This is likely due to reduced salt intake and hypothyroidism. It is stable. We will observe.  Orders Placed This Encounter  Procedures  . Urine culture    Standing Status: Future     Number of Occurrences: 1     Standing Expiration Date: 05/10/2014  . Comprehensive metabolic panel    Standing Status: Standing     Number of Occurrences: 9     Standing Expiration Date: 05/10/2014  . CBC with Differential    Standing Status: Standing     Number of Occurrences: 9     Standing Expiration Date: 05/10/2014  . Urinalysis, Microscopic - [facility]    Standing Status: Future     Number of  Occurrences: 1     Standing Expiration Date: 05/10/2014   All questions were answered. The patient knows to call the clinic with any problems, questions or concerns. No barriers to learning was detected.    Paddy Walthall, MD 05/10/2013 9:51 AM

## 2013-05-10 NOTE — Progress Notes (Signed)
Urinalysis revealed significant amount of bacteria. I recommend starting him on amoxicillin 500 mg twice a day for one week to treat urinary tract infection. We will proceed with chemotherapy today without delay.

## 2013-05-12 ENCOUNTER — Telehealth: Payer: Self-pay | Admitting: *Deleted

## 2013-05-12 NOTE — Telephone Encounter (Signed)
S/w pt's son and asked if pt's urinary symptoms are any better or any worse since starting on Amoxicillin?   He states pt has not mentioned any complaints, but he will ask him this afternoon how he is feeling and call nurse back w/ report.

## 2013-05-12 NOTE — Telephone Encounter (Signed)
Message copied by Rolanda Jay on Thu May 12, 2013  9:38 AM ------      Message from: [facility], [facility]      Created: Thu May 12, 2013  9:27 AM      Regarding: UTI       Can you please call his son (pt could hardly talk) to check whether the amoxicillin is working? If not, we may have to switch antibiotics      Thanks      ----- Message -----         From: [facility]         Sent: 05/10/2013   8:59 AM           To: Artis Delay, MD                   ------

## 2013-05-17 ENCOUNTER — Other Ambulatory Visit (HOSPITAL_BASED_OUTPATIENT_CLINIC_OR_DEPARTMENT_OTHER): Payer: Medicare Other | Admitting: Lab

## 2013-05-17 ENCOUNTER — Ambulatory Visit (HOSPITAL_BASED_OUTPATIENT_CLINIC_OR_DEPARTMENT_OTHER): Payer: Medicare Other

## 2013-05-17 VITALS — BP 114/69 | HR 80 | Temp 98.2°F | Resp 19 | Ht 69.0 in

## 2013-05-17 DIAGNOSIS — Z5111 Encounter for antineoplastic chemotherapy: Secondary | ICD-10-CM

## 2013-05-17 DIAGNOSIS — C7951 Secondary malignant neoplasm of bone: Secondary | ICD-10-CM

## 2013-05-17 DIAGNOSIS — C321 Malignant neoplasm of supraglottis: Secondary | ICD-10-CM

## 2013-05-17 DIAGNOSIS — G629 Polyneuropathy, unspecified: Secondary | ICD-10-CM

## 2013-05-17 LAB — CBC WITH DIFFERENTIAL/PLATELET
BASO%: 0.6 % (ref 0.0–2.0)
EOS%: 1.5 % (ref 0.0–7.0)
Eosinophils Absolute: 0.1 10*3/uL (ref 0.0–0.5)
HCT: 29.2 % — ABNORMAL LOW (ref 38.4–49.9)
LYMPH%: 28 % (ref 14.0–49.0)
MCH: 30.8 pg (ref 27.2–33.4)
MCHC: 33.2 g/dL (ref 32.0–36.0)
MCV: 92.7 fL (ref 79.3–98.0)
MONO#: 0.3 10*3/uL (ref 0.1–0.9)
MONO%: 9.9 % (ref 0.0–14.0)
NEUT%: 60 % (ref 39.0–75.0)
Platelets: 211 10*3/uL (ref 140–400)
RBC: 3.15 10*6/uL — ABNORMAL LOW (ref 4.20–5.82)
RDW: 17.9 % — ABNORMAL HIGH (ref 11.0–14.6)
WBC: 3.3 10*3/uL — ABNORMAL LOW (ref 4.0–10.3)

## 2013-05-17 LAB — COMPREHENSIVE METABOLIC PANEL (CC13)
ALT: 13 U/L (ref 0–55)
AST: 22 U/L (ref 5–34)
Albumin: 3.6 g/dL (ref 3.5–5.0)
Alkaline Phosphatase: 56 U/L (ref 40–150)
Anion Gap: 7 mEq/L (ref 3–11)
BUN: 13.2 mg/dL (ref 7.0–26.0)
Calcium: 9.6 mg/dL (ref 8.4–10.4)
Potassium: 4.3 mEq/L (ref 3.5–5.1)
Sodium: 134 mEq/L — ABNORMAL LOW (ref 136–145)
Total Bilirubin: 0.65 mg/dL (ref 0.20–1.20)
Total Protein: 8.2 g/dL (ref 6.4–8.3)

## 2013-05-17 MED ORDER — ONDANSETRON 16 MG/50ML IVPB (CHCC)
INTRAVENOUS | Status: AC
  Filled 2013-05-17: qty 16

## 2013-05-17 MED ORDER — DIPHENHYDRAMINE HCL 50 MG/ML IJ SOLN
12.5000 mg | Freq: Once | INTRAMUSCULAR | Status: AC
  Administered 2013-05-17: 12.5 mg via INTRAVENOUS

## 2013-05-17 MED ORDER — SODIUM CHLORIDE 0.9 % IV SOLN
64.0000 mg/m2 | Freq: Once | INTRAVENOUS | Status: AC
  Administered 2013-05-17: 102 mg via INTRAVENOUS
  Filled 2013-05-17: qty 17

## 2013-05-17 MED ORDER — HEPARIN SOD (PORK) LOCK FLUSH 100 UNIT/ML IV SOLN
500.0000 [IU] | Freq: Once | INTRAVENOUS | Status: AC | PRN
  Administered 2013-05-17: 500 [IU]
  Filled 2013-05-17: qty 5

## 2013-05-17 MED ORDER — SODIUM CHLORIDE 0.9 % IV SOLN
Freq: Once | INTRAVENOUS | Status: AC
  Administered 2013-05-17: 10:00:00 via INTRAVENOUS

## 2013-05-17 MED ORDER — FAMOTIDINE IN NACL 20-0.9 MG/50ML-% IV SOLN
INTRAVENOUS | Status: AC
  Filled 2013-05-17: qty 50

## 2013-05-17 MED ORDER — FAMOTIDINE IN NACL 20-0.9 MG/50ML-% IV SOLN
20.0000 mg | Freq: Once | INTRAVENOUS | Status: AC
  Administered 2013-05-17: 20 mg via INTRAVENOUS

## 2013-05-17 MED ORDER — ONDANSETRON 16 MG/50ML IVPB (CHCC)
16.0000 mg | Freq: Once | INTRAVENOUS | Status: AC
  Administered 2013-05-17: 16 mg via INTRAVENOUS

## 2013-05-17 MED ORDER — DEXAMETHASONE SODIUM PHOSPHATE 20 MG/5ML IJ SOLN
20.0000 mg | Freq: Once | INTRAMUSCULAR | Status: AC
  Administered 2013-05-17: 20 mg via INTRAVENOUS

## 2013-05-17 MED ORDER — PACLITAXEL CHEMO INJECTION 300 MG/50ML: 64.0000 mg/m2 | Freq: Once | INTRAVENOUS | Status: DC

## 2013-05-17 MED ORDER — SODIUM CHLORIDE 0.9 % IV SOLN
120.0000 mg | Freq: Once | INTRAVENOUS | Status: AC
  Administered 2013-05-17: 120 mg via INTRAVENOUS
  Filled 2013-05-17: qty 12

## 2013-05-17 MED ORDER — SODIUM CHLORIDE 0.9 % IJ SOLN
10.0000 mL | INTRAMUSCULAR | Status: DC | PRN
  Administered 2013-05-17: 10 mL
  Filled 2013-05-17: qty 10

## 2013-05-17 MED ORDER — DEXAMETHASONE SODIUM PHOSPHATE 20 MG/5ML IJ SOLN
INTRAMUSCULAR | Status: AC
  Filled 2013-05-17: qty 5

## 2013-05-17 MED ORDER — DIPHENHYDRAMINE HCL 50 MG/ML IJ SOLN
INTRAMUSCULAR | Status: AC
  Filled 2013-05-17: qty 1

## 2013-05-17 NOTE — Patient Instructions (Signed)
[facility] Discharge Instructions for Patients Receiving Chemotherapy  Today you received the following chemotherapy agents: Taxol, Carboplatin.  To help prevent nausea and vomiting after your treatment, we encourage you to take your nausea medication.   If you develop nausea and vomiting that is not controlled by your nausea medication, call the clinic.   BELOW ARE SYMPTOMS THAT SHOULD BE REPORTED IMMEDIATELY:  *FEVER GREATER THAN 100.5 F  *CHILLS WITH OR WITHOUT FEVER  NAUSEA AND VOMITING THAT IS NOT CONTROLLED WITH YOUR NAUSEA MEDICATION  *UNUSUAL SHORTNESS OF BREATH  *UNUSUAL BRUISING OR BLEEDING  TENDERNESS IN MOUTH AND THROAT WITH OR WITHOUT PRESENCE OF ULCERS  *URINARY PROBLEMS  *BOWEL PROBLEMS  UNUSUAL RASH Items with * indicate a potential emergency and should be followed up as soon as possible.  Feel free to call the clinic you have any questions or concerns. The clinic phone number is [phone].    

## 2013-05-24 ENCOUNTER — Encounter: Payer: Self-pay | Admitting: *Deleted

## 2013-05-24 ENCOUNTER — Telehealth: Payer: Self-pay | Admitting: Hematology and Oncology

## 2013-05-24 ENCOUNTER — Ambulatory Visit (HOSPITAL_BASED_OUTPATIENT_CLINIC_OR_DEPARTMENT_OTHER): Payer: Medicare Other | Admitting: Hematology and Oncology

## 2013-05-24 ENCOUNTER — Other Ambulatory Visit (HOSPITAL_BASED_OUTPATIENT_CLINIC_OR_DEPARTMENT_OTHER): Payer: Medicare Other

## 2013-05-24 ENCOUNTER — Ambulatory Visit (HOSPITAL_BASED_OUTPATIENT_CLINIC_OR_DEPARTMENT_OTHER): Payer: Medicare Other

## 2013-05-24 VITALS — BP 137/76 | HR 84 | Temp 97.4°F | Resp 18 | Ht 69.0 in | Wt 125.3 lb

## 2013-05-24 DIAGNOSIS — G5690 Unspecified mononeuropathy of unspecified upper limb: Secondary | ICD-10-CM

## 2013-05-24 DIAGNOSIS — E039 Hypothyroidism, unspecified: Secondary | ICD-10-CM

## 2013-05-24 DIAGNOSIS — Z5111 Encounter for antineoplastic chemotherapy: Secondary | ICD-10-CM

## 2013-05-24 DIAGNOSIS — C7951 Secondary malignant neoplasm of bone: Secondary | ICD-10-CM

## 2013-05-24 DIAGNOSIS — D649 Anemia, unspecified: Secondary | ICD-10-CM

## 2013-05-24 DIAGNOSIS — C321 Malignant neoplasm of supraglottis: Secondary | ICD-10-CM

## 2013-05-24 DIAGNOSIS — R944 Abnormal results of kidney function studies: Secondary | ICD-10-CM

## 2013-05-24 DIAGNOSIS — D72819 Decreased white blood cell count, unspecified: Secondary | ICD-10-CM

## 2013-05-24 DIAGNOSIS — G629 Polyneuropathy, unspecified: Secondary | ICD-10-CM

## 2013-05-24 DIAGNOSIS — E871 Hypo-osmolality and hyponatremia: Secondary | ICD-10-CM

## 2013-05-24 LAB — COMPREHENSIVE METABOLIC PANEL (CC13)
Albumin: 3.6 g/dL (ref 3.5–5.0)
Anion Gap: 7 mEq/L (ref 3–11)
BUN: 14 mg/dL (ref 7.0–26.0)
CO2: 26 mEq/L (ref 22–29)
Calcium: 9.6 mg/dL (ref 8.4–10.4)
Chloride: 101 mEq/L (ref 98–109)
Glucose: 85 mg/dl (ref 70–140)
Potassium: 4.2 mEq/L (ref 3.5–5.1)
Sodium: 133 mEq/L — ABNORMAL LOW (ref 136–145)
Total Protein: 8.1 g/dL (ref 6.4–8.3)

## 2013-05-24 LAB — CBC WITH DIFFERENTIAL/PLATELET
BASO%: 0.6 % (ref 0.0–2.0)
Eosinophils Absolute: 0.1 10*3/uL (ref 0.0–0.5)
LYMPH%: 23.3 % (ref 14.0–49.0)
MCHC: 33.8 g/dL (ref 32.0–36.0)
MONO#: 0.3 10*3/uL (ref 0.1–0.9)
NEUT#: 2.1 10*3/uL (ref 1.5–6.5)
NEUT%: 64.3 % (ref 39.0–75.0)
Platelets: 208 10*3/uL (ref 140–400)
RBC: 3.1 10*6/uL — ABNORMAL LOW (ref 4.20–5.82)
RDW: 18.5 % — ABNORMAL HIGH (ref 11.0–14.6)
WBC: 3.3 10*3/uL — ABNORMAL LOW (ref 4.0–10.3)
lymph#: 0.8 10*3/uL — ABNORMAL LOW (ref 0.9–3.3)
nRBC: 0 % (ref 0–0)

## 2013-05-24 MED ORDER — ONDANSETRON 16 MG/50ML IVPB (CHCC)
INTRAVENOUS | Status: AC
  Filled 2013-05-24: qty 16

## 2013-05-24 MED ORDER — SODIUM CHLORIDE 0.9 % IJ SOLN
10.0000 mL | INTRAMUSCULAR | Status: DC | PRN
  Administered 2013-05-24: 10 mL
  Filled 2013-05-24: qty 10

## 2013-05-24 MED ORDER — FAMOTIDINE IN NACL 20-0.9 MG/50ML-% IV SOLN
20.0000 mg | Freq: Once | INTRAVENOUS | Status: AC
  Administered 2013-05-24: 20 mg via INTRAVENOUS

## 2013-05-24 MED ORDER — DEXAMETHASONE SODIUM PHOSPHATE 20 MG/5ML IJ SOLN
INTRAMUSCULAR | Status: AC
  Filled 2013-05-24: qty 5

## 2013-05-24 MED ORDER — FAMOTIDINE IN NACL 20-0.9 MG/50ML-% IV SOLN
INTRAVENOUS | Status: AC
  Filled 2013-05-24: qty 50

## 2013-05-24 MED ORDER — SODIUM CHLORIDE 0.9 % IV SOLN
Freq: Once | INTRAVENOUS | Status: AC
  Administered 2013-05-24: 10:00:00 via INTRAVENOUS

## 2013-05-24 MED ORDER — ONDANSETRON 16 MG/50ML IVPB (CHCC)
16.0000 mg | Freq: Once | INTRAVENOUS | Status: AC
  Administered 2013-05-24: 16 mg via INTRAVENOUS

## 2013-05-24 MED ORDER — DEXAMETHASONE SODIUM PHOSPHATE 20 MG/5ML IJ SOLN
20.0000 mg | Freq: Once | INTRAMUSCULAR | Status: AC
  Administered 2013-05-24: 20 mg via INTRAVENOUS

## 2013-05-24 MED ORDER — SODIUM CHLORIDE 0.9 % IV SOLN
120.0000 mg | Freq: Once | INTRAVENOUS | Status: AC
  Administered 2013-05-24: 120 mg via INTRAVENOUS
  Filled 2013-05-24: qty 12

## 2013-05-24 MED ORDER — DIPHENHYDRAMINE HCL 50 MG/ML IJ SOLN
12.5000 mg | Freq: Once | INTRAMUSCULAR | Status: AC
  Administered 2013-05-24: 12.5 mg via INTRAVENOUS

## 2013-05-24 MED ORDER — SODIUM CHLORIDE 0.9 % IV SOLN
64.0000 mg/m2 | Freq: Once | INTRAVENOUS | Status: AC
  Administered 2013-05-24: 108 mg via INTRAVENOUS
  Filled 2013-05-24: qty 18

## 2013-05-24 MED ORDER — DIPHENHYDRAMINE HCL 50 MG/ML IJ SOLN
INTRAMUSCULAR | Status: AC
  Filled 2013-05-24: qty 1

## 2013-05-24 MED ORDER — HEPARIN SOD (PORK) LOCK FLUSH 100 UNIT/ML IV SOLN
500.0000 [IU] | Freq: Once | INTRAVENOUS | Status: AC | PRN
  Administered 2013-05-24: 500 [IU]
  Filled 2013-05-24: qty 5

## 2013-05-24 NOTE — Progress Notes (Signed)
Met with patient and his daughter during and after scheduled appt with Dr. Bertis Ruddy to provide support and care continuity.Continuing to navigate as L2 (treatments established) patient.  Young Berry, RN, BSN, [facility] Head & Neck Oncology Navigator [phone]

## 2013-05-24 NOTE — Telephone Encounter (Signed)
appts made per 12/16 POF AVS and CAL given shh °

## 2013-05-24 NOTE — Patient Instructions (Signed)
[facility] Discharge Instructions for Patients Receiving Chemotherapy  Today you received the following chemotherapy agents: Taxol and Carboplatin.  To help prevent nausea and vomiting after your treatment, we encourage you to take your nausea medication as prescribed.   If you develop nausea and vomiting that is not controlled by your nausea medication, call the clinic.   BELOW ARE SYMPTOMS THAT SHOULD BE REPORTED IMMEDIATELY:  *FEVER GREATER THAN 100.5 F  *CHILLS WITH OR WITHOUT FEVER  NAUSEA AND VOMITING THAT IS NOT CONTROLLED WITH YOUR NAUSEA MEDICATION  *UNUSUAL SHORTNESS OF BREATH  *UNUSUAL BRUISING OR BLEEDING  TENDERNESS IN MOUTH AND THROAT WITH OR WITHOUT PRESENCE OF ULCERS  *URINARY PROBLEMS  *BOWEL PROBLEMS  UNUSUAL RASH Items with * indicate a potential emergency and should be followed up as soon as possible.  Feel free to call the clinic you have any questions or concerns. The clinic phone number is [phone].    

## 2013-05-24 NOTE — Progress Notes (Signed)
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     07/09/2012 Initial Diagnosis Carcinoma of supraglottis from biopsy, HPV negative   08/13/2012 Surgery He underwent laryngectomy and bilateral LN dissection. There were bilateral LN involvement and 10/25 LN were positive with extracapsular extension staging T3N2Mx   09/20/2012 - 10/29/2012 Chemotherapy he received concurrent chemo/Rt with weekly cisplatin   02/23/2013 Imaging Staging PET/Ct scan showed focal hypermetabolic bone lesions from metastatic cancer   03/09/2013 Procedure Placement of infusaport under IR   03/15/2013 -  Chemotherapy Start cycle 1 palliative chemotherapy with Weekly Carboplatin & Paclitacel with mild dosage adjustment due to elevated creatinine   05/03/2013 Imaging Repeat PEt/CT showed no evidence of local laryngeal squamous cell carcinoma in the neck & interval resolution of metabolic activity associated skeletal metastases.    05/10/2013 Adverse Reaction Dose of Paclitzel was reduced by 20% due to worsening neuropathy    INTERVAL HISTORY: Cody [name] 67 y.o. male returns for further followup. He complained of very mild  progression of peripheral neuropathy with intermittent pain at the tips of his fingers but not in his toes. He has a great appetite and continues to gain weight. He denies any recent fever, chills, night sweats or abnormal weight loss He has mild intermittent nausea, resolved with antiemetics. I have reviewed the past medical history, past surgical history, social history and family history with the patient and they are unchanged from previous note.  ALLERGIES:  has No Known Allergies.  MEDICATIONS:  Current Outpatient Prescriptions  Medication Sig Dispense Refill  . acetaminophen (TYLENOL) 325 MG tablet Take 650 mg by mouth daily as needed for pain.      Marland Kitchen HYDROcodone-acetaminophen (NORCO) 5-325 MG per tablet Take 1-2 tablets by mouth every 4 (four) hours as needed for pain.  25 tablet  1  . levothyroxine (SYNTHROID, LEVOTHROID) 50 MCG tablet Take 1 tablet (50 mcg total) by mouth daily before breakfast.  30 tablet  3  . Multiple Vitamin (MULTIVITAMIN) tablet Take 1 tablet by mouth daily.      . ondansetron (ZOFRAN) 8 MG tablet Take 1 tablet (8 mg total) by mouth every 8 (eight) hours as needed for nausea.  30 tablet  1  . promethazine (PHENERGAN) 25 MG suppository Place 1 suppository (25 mg total) rectally every 6 (six) hours as needed for nausea.  12 each  0  . senna (SENOKOT) 8.6 MG tablet Take 2 tablets (17.2 mg total) by mouth 2 (two) times daily.  60 tablet  3  . polyethylene glycol powder (GLYCOLAX/MIRALAX) powder        No current facility-administered medications for this visit.   Facility-Administered Medications Ordered in Other Visits  Medication Dose Route Frequency Provider Last Rate Last Dose  .  sodium chloride 0.9 % injection 10 mL  10 mL Intracatheter PRN Artis Delay, MD   10 mL at 03/15/13 1808    REVIEW OF SYSTEMS:   Constitutional: Denies fevers, chills or abnormal weight loss Eyes: Denies blurriness of vision Ears, nose, mouth, throat, and face: Denies mucositis or  sore throat Respiratory: Denies cough, dyspnea or wheezes Cardiovascular: Denies palpitation, chest discomfort or lower extremity swelling Gastrointestinal:  Denies nausea, heartburn or change in bowel habits Skin: Denies abnormal skin rashes Neurological:Denies numbness, tingling or new weaknesses Behavioral/Psych: Mood is stable, no new changes  All other systems were reviewed with the patient and are negative.  PHYSICAL EXAMINATION: ECOG PERFORMANCE STATUS: 1 - Symptomatic but completely ambulatory  Filed Vitals:   05/24/13 0902  BP: 137/76  Pulse: 84  Temp: 97.4 F (36.3 C)  Resp: 18   Filed Weights   05/24/13 0902  Weight: 125 lb 4.8 oz (56.836 kg)    GENERAL:alert, no distress and comfortable SKIN: skin color, texture, turgor are normal, no rashes or significant lesions EYES: normal, Conjunctiva are pink and non-injected, sclera clear OROPHARYNX:no exudate, no erythema and lips, buccal mucosa, and tongue normal  NECK: Significant radiation induced fibrosis. Tracheostomy site looks okay with no evidence of infection LYMPH:  no palpable lymphadenopathy in the cervical, axillary or inguinal LUNGS: clear to auscultation and percussion with normal breathing effort HEART: regular rate & rhythm and no murmurs and no lower extremity edema ABDOMEN:abdomen soft, non-tender and normal bowel sounds Musculoskeletal:no cyanosis of digits and no clubbing  NEURO: alert & oriented x 3 with fluent speech, no focal motor/sensory deficits  LABORATORY DATA:  I have reviewed the data as listed    Component Value Date/Time   NA 134* 05/17/2013 0921   NA 136 08/19/2012 0625   K 4.3 05/17/2013 0921   K 4.3 08/19/2012 0625   CL 98 11/15/2012 0941   CL 100 08/19/2012 0625   CO2 25 05/17/2013 0921   CO2 28 08/19/2012 0625   GLUCOSE 80 05/17/2013 0921   GLUCOSE 90 11/15/2012 0941   GLUCOSE 109* 08/19/2012 0625   BUN 13.2 05/17/2013 0921   BUN 9 08/19/2012 0625   CREATININE 1.1 05/17/2013 0921    CREATININE 0.69 08/19/2012 0625   CALCIUM 9.6 05/17/2013 0921   CALCIUM 8.3* 08/20/2012 0542   PROT 8.2 05/17/2013 0921   PROT 6.9 08/19/2012 0625   ALBUMIN 3.6 05/17/2013 0921   ALBUMIN 2.4* 08/19/2012 0625   AST 22 05/17/2013 0921   AST 25 08/19/2012 0625   ALT 13 05/17/2013 0921   ALT 20 08/19/2012 0625   ALKPHOS 56 05/17/2013 0921   ALKPHOS 57 08/19/2012 0625   BILITOT 0.65 05/17/2013 0921   BILITOT 0.6 08/19/2012 0625   GFRNONAA >90 08/19/2012 0625   GFRAA >90 08/19/2012 0625    No results found for this basename: SPEP,  UPEP,   kappa and lambda light chains    Lab Results  Component Value Date   WBC 3.3* 05/24/2013   NEUTROABS 2.1 05/24/2013   HGB 9.8* 05/24/2013   HCT 29.0* 05/24/2013   MCV 93.5 05/24/2013   PLT 208 05/24/2013      Chemistry      Component Value Date/Time   NA 134* 05/17/2013 0921   NA 136 08/19/2012 0625   K 4.3 05/17/2013 0921   K 4.3 08/19/2012 0625   CL 98 11/15/2012 0941   CL 100 08/19/2012 0625   CO2 25 05/17/2013 0921   CO2 28 08/19/2012  0625   BUN 13.2 05/17/2013 0921   BUN 9 08/19/2012 0625   CREATININE 1.1 05/17/2013 0921   CREATININE 0.69 08/19/2012 0625      Component Value Date/Time   CALCIUM 9.6 05/17/2013 0921   CALCIUM 8.3* 08/20/2012 0542   ALKPHOS 56 05/17/2013 0921   ALKPHOS 57 08/19/2012 0625   AST 22 05/17/2013 0921   AST 25 08/19/2012 0625   ALT 13 05/17/2013 0921   ALT 20 08/19/2012 0625   BILITOT 0.65 05/17/2013 0921   BILITOT 0.6 08/19/2012 0625      ASSESSMENT & PLAN:  #1 metastatic laryngeal cancer I recommend we continue treatment with current  dosage adjustment. PET/CT scan showed great response to treatment. I recommend we gave him total of 6 cycles of treatment before we repeat another PET/CT scan. He agreed. #2 mild peripheral neuropathy This is not unexpected side effects from chemotherapy.  I recommend we reduced to paclitaxel go by 20% due to this. I would also adjust his future treatment cycle to 2 weeks on 1 week off. #3  leukopenia This is likely due to recent treatment. The patient denies recent history of fevers, cough, chills, diarrhea or dysuria. He is asymptomatic from the leukopenia. I will observe for now.  I will continue the chemotherapy at current dose without dosage adjustment. For future cycles, I'm going to reduce his treatment to 2 weeks on 1 week off. #4 significant hypothyroidism I have increased his dose recently. We will recheck thyroid function test in his next visit. #5 anemia This is likely due to recent treatment. The patient denies recent history of bleeding such as epistaxis, hematuria or hematochezia. He is asymptomatic from the anemia. I will observe for now.  he does not require transfusion now. I will continue the chemotherapy at current dose without dosage adjustment.  If the anemia gets progressive worse in the future, I might have to delay his treatment or adjust the chemotherapy dose. #6 mildly elevated creatinine function His chemotherapy doses has been adjusted because of this. I continue to encourage patient to increase oral intake #7 bony metastasis I will hold off giving him IV bisphosphonates to reduce the risk of osteonecrosis of the jaw due to recent radiation treatment. The patient is asymptomatic at this point in time. #8 hyponatremia This is likely due to reduced salt intake and hypothyroidism. It is stable. We will observe.   Orders Placed This Encounter  Procedures  . TSH    Standing Status: Future     Number of Occurrences:      Standing Expiration Date: 05/24/2014  . T4    Standing Status: Future     Number of Occurrences:      Standing Expiration Date: 05/24/2014   All questions were answered. The patient knows to call the clinic with any problems, questions or concerns. No barriers to learning was detected.   Shilah Hefel, MD 05/24/2013 9:19 AM

## 2013-06-07 ENCOUNTER — Ambulatory Visit (HOSPITAL_BASED_OUTPATIENT_CLINIC_OR_DEPARTMENT_OTHER): Payer: Medicare Other

## 2013-06-07 ENCOUNTER — Other Ambulatory Visit: Payer: Self-pay

## 2013-06-07 ENCOUNTER — Telehealth: Payer: Self-pay | Admitting: Hematology and Oncology

## 2013-06-07 ENCOUNTER — Ambulatory Visit (HOSPITAL_BASED_OUTPATIENT_CLINIC_OR_DEPARTMENT_OTHER): Payer: Medicare Other | Admitting: Hematology and Oncology

## 2013-06-07 ENCOUNTER — Other Ambulatory Visit (HOSPITAL_BASED_OUTPATIENT_CLINIC_OR_DEPARTMENT_OTHER): Payer: Medicare Other

## 2013-06-07 ENCOUNTER — Encounter: Payer: Self-pay | Admitting: *Deleted

## 2013-06-07 VITALS — BP 135/72 | HR 73 | Temp 97.7°F | Resp 20 | Ht 69.0 in | Wt 125.0 lb

## 2013-06-07 DIAGNOSIS — Z5111 Encounter for antineoplastic chemotherapy: Secondary | ICD-10-CM

## 2013-06-07 DIAGNOSIS — D72819 Decreased white blood cell count, unspecified: Secondary | ICD-10-CM

## 2013-06-07 DIAGNOSIS — C321 Malignant neoplasm of supraglottis: Secondary | ICD-10-CM

## 2013-06-07 DIAGNOSIS — D649 Anemia, unspecified: Secondary | ICD-10-CM

## 2013-06-07 DIAGNOSIS — E871 Hypo-osmolality and hyponatremia: Secondary | ICD-10-CM

## 2013-06-07 DIAGNOSIS — G629 Polyneuropathy, unspecified: Secondary | ICD-10-CM

## 2013-06-07 DIAGNOSIS — C7951 Secondary malignant neoplasm of bone: Secondary | ICD-10-CM

## 2013-06-07 DIAGNOSIS — G609 Hereditary and idiopathic neuropathy, unspecified: Secondary | ICD-10-CM

## 2013-06-07 DIAGNOSIS — E039 Hypothyroidism, unspecified: Secondary | ICD-10-CM

## 2013-06-07 LAB — CBC WITH DIFFERENTIAL/PLATELET
BASO%: 0.3 % (ref 0.0–2.0)
Eosinophils Absolute: 0.1 10*3/uL (ref 0.0–0.5)
HGB: 10.2 g/dL — ABNORMAL LOW (ref 13.0–17.1)
LYMPH%: 24.4 % (ref 14.0–49.0)
MCHC: 33.7 g/dL (ref 32.0–36.0)
MONO#: 0.4 10*3/uL (ref 0.1–0.9)
NEUT#: 1.9 10*3/uL (ref 1.5–6.5)
Platelets: 171 10*3/uL (ref 140–400)
RBC: 3.21 10*6/uL — ABNORMAL LOW (ref 4.20–5.82)
RDW: 18.5 % — ABNORMAL HIGH (ref 11.0–14.6)
WBC: 3.2 10*3/uL — ABNORMAL LOW (ref 4.0–10.3)
lymph#: 0.8 10*3/uL — ABNORMAL LOW (ref 0.9–3.3)

## 2013-06-07 LAB — COMPREHENSIVE METABOLIC PANEL (CC13)
ALT: 11 U/L (ref 0–55)
AST: 19 U/L (ref 5–34)
Albumin: 3.7 g/dL (ref 3.5–5.0)
Anion Gap: 12 mEq/L — ABNORMAL HIGH (ref 3–11)
CO2: 22 mEq/L (ref 22–29)
Calcium: 9.5 mg/dL (ref 8.4–10.4)
Chloride: 101 mEq/L (ref 98–109)
Creatinine: 1 mg/dL (ref 0.7–1.3)
Glucose: 103 mg/dl (ref 70–140)
Potassium: 4 mEq/L (ref 3.5–5.1)
Sodium: 135 mEq/L — ABNORMAL LOW (ref 136–145)
Total Protein: 8.1 g/dL (ref 6.4–8.3)

## 2013-06-07 LAB — TSH CHCC: TSH: 14.267 m(IU)/L — ABNORMAL HIGH (ref 0.320–4.118)

## 2013-06-07 LAB — T4, FREE: Free T4: 0.88 ng/dL (ref 0.80–1.80)

## 2013-06-07 MED ORDER — DEXAMETHASONE SODIUM PHOSPHATE 20 MG/5ML IJ SOLN
INTRAMUSCULAR | Status: AC
  Filled 2013-06-07: qty 5

## 2013-06-07 MED ORDER — HEPARIN SOD (PORK) LOCK FLUSH 100 UNIT/ML IV SOLN
500.0000 [IU] | Freq: Once | INTRAVENOUS | Status: AC | PRN
  Administered 2013-06-07: 500 [IU]
  Filled 2013-06-07: qty 5

## 2013-06-07 MED ORDER — SODIUM CHLORIDE 0.9 % IV SOLN
108.0000 mg | Freq: Once | INTRAVENOUS | Status: AC
  Administered 2013-06-07: 108 mg via INTRAVENOUS
  Filled 2013-06-07: qty 18

## 2013-06-07 MED ORDER — SODIUM CHLORIDE 0.9 % IJ SOLN
10.0000 mL | INTRAMUSCULAR | Status: DC | PRN
  Administered 2013-06-07: 10 mL
  Filled 2013-06-07: qty 10

## 2013-06-07 MED ORDER — ONDANSETRON 16 MG/50ML IVPB (CHCC)
INTRAVENOUS | Status: AC
  Filled 2013-06-07: qty 16

## 2013-06-07 MED ORDER — SODIUM CHLORIDE 0.9 % IV SOLN
120.0000 mg | Freq: Once | INTRAVENOUS | Status: AC
  Administered 2013-06-07: 120 mg via INTRAVENOUS
  Filled 2013-06-07: qty 12

## 2013-06-07 MED ORDER — DIPHENHYDRAMINE HCL 50 MG/ML IJ SOLN
INTRAMUSCULAR | Status: AC
  Filled 2013-06-07: qty 1

## 2013-06-07 MED ORDER — FAMOTIDINE IN NACL 20-0.9 MG/50ML-% IV SOLN
20.0000 mg | Freq: Once | INTRAVENOUS | Status: AC
  Administered 2013-06-07: 20 mg via INTRAVENOUS

## 2013-06-07 MED ORDER — DEXAMETHASONE SODIUM PHOSPHATE 20 MG/5ML IJ SOLN
20.0000 mg | Freq: Once | INTRAMUSCULAR | Status: AC
  Administered 2013-06-07: 20 mg via INTRAVENOUS

## 2013-06-07 MED ORDER — DIPHENHYDRAMINE HCL 50 MG/ML IJ SOLN
12.5000 mg | Freq: Once | INTRAMUSCULAR | Status: AC
  Administered 2013-06-07: 12.5 mg via INTRAVENOUS

## 2013-06-07 MED ORDER — SODIUM CHLORIDE 0.9 % IV SOLN
Freq: Once | INTRAVENOUS | Status: AC
  Administered 2013-06-07: 14:00:00 via INTRAVENOUS

## 2013-06-07 MED ORDER — PACLITAXEL CHEMO INJECTION 300 MG/50ML: 64.0000 mg/m2 | Freq: Once | INTRAVENOUS | Status: DC

## 2013-06-07 MED ORDER — ONDANSETRON 16 MG/50ML IVPB (CHCC)
16.0000 mg | Freq: Once | INTRAVENOUS | Status: AC
  Administered 2013-06-07: 16 mg via INTRAVENOUS

## 2013-06-07 MED ORDER — FAMOTIDINE IN NACL 20-0.9 MG/50ML-% IV SOLN
INTRAVENOUS | Status: AC
  Filled 2013-06-07: qty 50

## 2013-06-07 NOTE — Progress Notes (Signed)
Met with patient and his dtr during scheduled appt with Dr. Bertis Ruddy and during infusion to provide support and encouragement.  Continuing to navigate as L2 (treatments established) patient.  Young Berry, RN, BSN, [facility] On[facility] Navigator [phone]

## 2013-06-07 NOTE — Progress Notes (Signed)
[facility] OFFICE PROGRESS NOTE  Patient Care Team: Dorothyann Peng, MD as PCP - General (Internal Medicine) Serena Colonel, MD as Attending Physician (Otolaryngology) Dennis Bast, RN as Registered Nurse (Oncology) Artis Delay, MD as Consulting Physician (Hematology and Oncology)  DIAGNOSIS: Metastatic laryngeal cancer  SUMMARY OF ONCOLOGIC HISTORY: Oncology History    Carcinoma of supraglottis   Primary site: Larynx - Supraglottis (Right)   Staging method: AJCC 7th Edition   Clinical free text: T3N2M1   Clinical: (T3, N2c, M1)   Summary: (T3, N2c, M1)      Carcinoma of supraglottis   07/03/2012 Imaging Primary malignancy suspected in the supraglottic, glottic and hypopharyngeal region with necrotic bilateral adenopathy larger on the right.     07/09/2012 Initial Diagnosis Carcinoma of supraglottis from biopsy, HPV negative   08/13/2012 Surgery He underwent laryngectomy and bilateral LN dissection. There were bilateral LN involvement and 10/25 LN were positive with extracapsular extension staging T3N2Mx   09/20/2012 - 10/29/2012 Chemotherapy he received concurrent chemo/Rt with weekly cisplatin   02/23/2013 Imaging Staging PET/Ct scan showed focal hypermetabolic bone lesions from metastatic cancer   03/09/2013 Procedure Placement of infusaport under IR   03/15/2013 -  Chemotherapy Start cycle 1 palliative chemotherapy with Weekly Carboplatin & Paclitacel with mild dosage adjustment due to elevated creatinine   05/03/2013 Imaging Repeat PEt/CT showed no evidence of local laryngeal squamous cell carcinoma in the neck & interval resolution of metabolic activity associated skeletal metastases.    05/10/2013 Adverse Reaction Dose of Paclitzel was reduced by 20% due to worsening neuropathy    INTERVAL HISTORY: SANI MADARIAGA 67 y.o. male returns for further followup. He denies any worsening peripheral neuropathy. He denies any recent fever, chills, night sweats or abnormal weight  loss No recent infection. I have reviewed the past medical history, past surgical history, social history and family history with the patient and they are unchanged from previous note.  ALLERGIES:  has No Known Allergies.  MEDICATIONS:  Current Outpatient Prescriptions  Medication Sig Dispense Refill  . acetaminophen (TYLENOL) 325 MG tablet Take 650 mg by mouth daily as needed for pain.      Marland Kitchen HYDROcodone-acetaminophen (NORCO) 5-325 MG per tablet Take 1-2 tablets by mouth every 4 (four) hours as needed for pain.  25 tablet  1  . levothyroxine (SYNTHROID, LEVOTHROID) 50 MCG tablet Take 1 tablet (50 mcg total) by mouth daily before breakfast.  30 tablet  3  . Multiple Vitamin (MULTIVITAMIN) tablet Take 1 tablet by mouth daily.      . ondansetron (ZOFRAN) 8 MG tablet Take 1 tablet (8 mg total) by mouth every 8 (eight) hours as needed for nausea.  30 tablet  1  . polyethylene glycol powder (GLYCOLAX/MIRALAX) powder       . promethazine (PHENERGAN) 25 MG suppository Place 1 suppository (25 mg total) rectally every 6 (six) hours as needed for nausea.  12 each  0  . senna (SENOKOT) 8.6 MG tablet Take 2 tablets (17.2 mg total) by mouth 2 (two) times daily.  60 tablet  3   No current facility-administered medications for this visit.   Facility-Administered Medications Ordered in Other Visits  Medication Dose Route Frequency Provider Last Rate Last Dose  . sodium chloride 0.9 % injection 10 mL  10 mL Intracatheter PRN Artis Delay, MD   10 mL at 03/15/13 1808    REVIEW OF SYSTEMS:   Constitutional: Denies fevers, chills or abnormal weight loss Eyes: Denies blurriness of vision  Ears, nose, mouth, throat, and face: Denies mucositis or sore throat Respiratory: Denies cough, dyspnea or wheezes Cardiovascular: Denies palpitation, chest discomfort or lower extremity swelling Gastrointestinal:  Denies nausea, heartburn or change in bowel habits Skin: Denies abnormal skin rashes Lymphatics: Denies new  lymphadenopathy or easy bruising Neurological: Had mild persistent peripheral neuropathy in the tips of fingers and toes, not worse Behavioral/Psych: Mood is stable, no new changes  All other systems were reviewed with the patient and are negative.  PHYSICAL EXAMINATION: ECOG PERFORMANCE STATUS: 1 - Symptomatic but completely ambulatory  Filed Vitals:   06/07/13 1002  BP: 135/72  Pulse: 73  Temp: 97.7 F (36.5 C)  Resp: 20   Filed Weights   06/07/13 1002  Weight: 125 lb (56.7 kg)    GENERAL:alert, no distress and comfortable SKIN: skin color, texture, turgor are normal, no rashes or significant lesions EYES: normal, Conjunctiva are pink and non-injected, sclera clear OROPHARYNX:no exudate, no erythema and lips, buccal mucosa, and tongue normal  NECK: Well-healed surgical scar with muscle thickness from prior surgery and radiation therapy  LYMPH:  no palpable lymphadenopathy in the cervical, axillary or inguinal LUNGS: clear to auscultation and percussion with normal breathing effort HEART: regular rate & rhythm and no murmurs and no lower extremity edema ABDOMEN:abdomen soft, non-tender and normal bowel sounds Musculoskeletal:no cyanosis of digits and no clubbing  NEURO: alert & oriented x 3 with fluent speech, no focal motor/sensory deficits  LABORATORY DATA:  I have reviewed the data as listed    Component Value Date/Time   NA 133* 05/24/2013 0849   NA 136 08/19/2012 0625   K 4.2 05/24/2013 0849   K 4.3 08/19/2012 0625   CL 98 11/15/2012 0941   CL 100 08/19/2012 0625   CO2 26 05/24/2013 0849   CO2 28 08/19/2012 0625   GLUCOSE 85 05/24/2013 0849   GLUCOSE 90 11/15/2012 0941   GLUCOSE 109* 08/19/2012 0625   BUN 14.0 05/24/2013 0849   BUN 9 08/19/2012 0625   CREATININE 1.1 05/24/2013 0849   CREATININE 0.69 08/19/2012 0625   CALCIUM 9.6 05/24/2013 0849   CALCIUM 8.3* 08/20/2012 0542   PROT 8.1 05/24/2013 0849   PROT 6.9 08/19/2012 0625   ALBUMIN 3.6 05/24/2013 0849   ALBUMIN  2.4* 08/19/2012 0625   AST 22 05/24/2013 0849   AST 25 08/19/2012 0625   ALT 12 05/24/2013 0849   ALT 20 08/19/2012 0625   ALKPHOS 60 05/24/2013 0849   ALKPHOS 57 08/19/2012 0625   BILITOT 0.66 05/24/2013 0849   BILITOT 0.6 08/19/2012 0625   GFRNONAA >90 08/19/2012 0625   GFRAA >90 08/19/2012 0625    No results found for this basename: SPEP,  UPEP,   kappa and lambda light chains    Lab Results  Component Value Date   WBC 3.2* 06/07/2013   NEUTROABS 1.9 06/07/2013   HGB 10.2* 06/07/2013   HCT 30.3* 06/07/2013   MCV 94.4 06/07/2013   PLT 171 06/07/2013      Chemistry      Component Value Date/Time   NA 133* 05/24/2013 0849   NA 136 08/19/2012 0625   K 4.2 05/24/2013 0849   K 4.3 08/19/2012 0625   CL 98 11/15/2012 0941   CL 100 08/19/2012 0625   CO2 26 05/24/2013 0849   CO2 28 08/19/2012 0625   BUN 14.0 05/24/2013 0849   BUN 9 08/19/2012 0625   CREATININE 1.1 05/24/2013 0849   CREATININE 0.69 08/19/2012 1610  Component Value Date/Time   CALCIUM 9.6 05/24/2013 0849   CALCIUM 8.3* 08/20/2012 0542   ALKPHOS 60 05/24/2013 0849   ALKPHOS 57 08/19/2012 0625   AST 22 05/24/2013 0849   AST 25 08/19/2012 0625   ALT 12 05/24/2013 0849   ALT 20 08/19/2012 0625   BILITOT 0.66 05/24/2013 0849   BILITOT 0.6 08/19/2012 0625     ASSESSMENT & PLAN:  #1 metastatic laryngeal cancer I recommend we continue treatment with current dosage adjustment. PET/CT scan showed great response to treatment. I recommend we gave him total of 6 cycles of treatment before we repeat another PET/CT scan. He agreed. #2 mild peripheral neuropathy This is not unexpected side effects from chemotherapy.  I recommend we reduced to paclitaxel go by 20% due to this. I would also adjust his future treatment cycle to 2 weeks on 1 week off. #3 leukopenia This is likely due to recent treatment. The patient denies recent history of fevers, cough, chills, diarrhea or dysuria. He is asymptomatic from the leukopenia. I will  observe for now.  I will continue the chemotherapy at current dose without dosage adjustment. For future cycles, I'm going to reduce his treatment to 2 weeks on 1 week off. #4 significant hypothyroidism I have increased his dose recently. Recheck thyroid function test is pending. #5 anemia This is likely due to recent treatment. The patient denies recent history of bleeding such as epistaxis, hematuria or hematochezia. He is asymptomatic from the anemia. I will observe for now.  he does not require transfusion now. I will continue the chemotherapy at current dose without dosage adjustment.  If the anemia gets progressive worse in the future, I might have to delay his treatment or adjust the chemotherapy dose. #6 bony metastasis I will hold off giving him IV bisphosphonates to reduce the risk of osteonecrosis of the jaw due to recent radiation treatment. The patient is asymptomatic at this point in time. #7 hyponatremia This is likely due to reduced salt intake and hypothyroidism. It is stable. We will observe.   All questions were answered. The patient knows to call the clinic with any problems, questions or concerns. No barriers to learning was detected.   Bintou Lafata, MD 06/07/2013 10:32 AM

## 2013-06-07 NOTE — Patient Instructions (Signed)
[facility] Discharge Instructions for Patients Receiving Chemotherapy  Today you received the following chemotherapy agents Taxol/Carbo.   To help prevent nausea and vomiting after your treatment, we encourage you to take your nausea medication as prescribed.    If you develop nausea and vomiting that is not controlled by your nausea medication, call the clinic.   BELOW ARE SYMPTOMS THAT SHOULD BE REPORTED IMMEDIATELY:  *FEVER GREATER THAN 100.5 F  *CHILLS WITH OR WITHOUT FEVER  NAUSEA AND VOMITING THAT IS NOT CONTROLLED WITH YOUR NAUSEA MEDICATION  *UNUSUAL SHORTNESS OF BREATH  *UNUSUAL BRUISING OR BLEEDING  TENDERNESS IN MOUTH AND THROAT WITH OR WITHOUT PRESENCE OF ULCERS  *URINARY PROBLEMS  *BOWEL PROBLEMS  UNUSUAL RASH Items with * indicate a potential emergency and should be followed up as soon as possible.  Feel free to call the clinic should you have any questions or concerns. The clinic phone number is [phone].  It was my pleasure to serve you today!  Lawerance Cruel, RN

## 2013-06-07 NOTE — Telephone Encounter (Signed)
[facility] RELATIVE APPT SCHEDULE FOR JANUARY. PER 12/30 POF TX TODAY AND NEXT WK - THEN OFF AND START AGAIN 1/20.

## 2013-06-08 ENCOUNTER — Telehealth: Payer: Self-pay | Admitting: *Deleted

## 2013-06-08 ENCOUNTER — Other Ambulatory Visit: Payer: Self-pay | Admitting: Hematology and Oncology

## 2013-06-08 DIAGNOSIS — E039 Hypothyroidism, unspecified: Secondary | ICD-10-CM

## 2013-06-08 NOTE — Telephone Encounter (Signed)
Left VM for daughter informing of Dr. Maxine Glenn note below and instructed her to call back if any questions or concerns.

## 2013-06-08 NOTE — Telephone Encounter (Signed)
Message copied by Rolanda Jay on Wed Jun 08, 2013  3:25 PM ------      Message from: [facility] [place] [place]      Created: Wed Jun 08, 2013 12:52 PM      Regarding: Thyroid function test       Please call daughter. Results are improving      No dose adjustment needed      Plan to recheck in Feb      ----- Message -----         From: [facility]         Sent: 06/07/2013   9:55 AM           To: Artis Delay, MD                   ------

## 2013-06-14 ENCOUNTER — Other Ambulatory Visit (HOSPITAL_BASED_OUTPATIENT_CLINIC_OR_DEPARTMENT_OTHER): Payer: Medicare Other

## 2013-06-14 ENCOUNTER — Ambulatory Visit: Payer: Self-pay

## 2013-06-14 ENCOUNTER — Ambulatory Visit (HOSPITAL_BASED_OUTPATIENT_CLINIC_OR_DEPARTMENT_OTHER): Payer: Medicare Other

## 2013-06-14 ENCOUNTER — Telehealth: Payer: Self-pay | Admitting: *Deleted

## 2013-06-14 VITALS — BP 113/54 | HR 82 | Temp 97.7°F | Resp 20

## 2013-06-14 DIAGNOSIS — C7951 Secondary malignant neoplasm of bone: Secondary | ICD-10-CM

## 2013-06-14 DIAGNOSIS — C321 Malignant neoplasm of supraglottis: Secondary | ICD-10-CM

## 2013-06-14 DIAGNOSIS — G629 Polyneuropathy, unspecified: Secondary | ICD-10-CM

## 2013-06-14 DIAGNOSIS — C7952 Secondary malignant neoplasm of bone marrow: Secondary | ICD-10-CM

## 2013-06-14 DIAGNOSIS — Z5111 Encounter for antineoplastic chemotherapy: Secondary | ICD-10-CM

## 2013-06-14 LAB — COMPREHENSIVE METABOLIC PANEL (CC13)
ALBUMIN: 3.6 g/dL (ref 3.5–5.0)
ALK PHOS: 55 U/L (ref 40–150)
ALT: 12 U/L (ref 0–55)
AST: 18 U/L (ref 5–34)
Anion Gap: 6 mEq/L (ref 3–11)
BUN: 10.6 mg/dL (ref 7.0–26.0)
CALCIUM: 9 mg/dL (ref 8.4–10.4)
CHLORIDE: 101 meq/L (ref 98–109)
CO2: 26 meq/L (ref 22–29)
Creatinine: 1.1 mg/dL (ref 0.7–1.3)
GLUCOSE: 83 mg/dL (ref 70–140)
Potassium: 4.1 mEq/L (ref 3.5–5.1)
SODIUM: 133 meq/L — AB (ref 136–145)
TOTAL PROTEIN: 7.7 g/dL (ref 6.4–8.3)
Total Bilirubin: 0.82 mg/dL (ref 0.20–1.20)

## 2013-06-14 LAB — CBC WITH DIFFERENTIAL/PLATELET
BASO%: 0.8 % (ref 0.0–2.0)
Basophils Absolute: 0 10*3/uL (ref 0.0–0.1)
EOS%: 1.9 % (ref 0.0–7.0)
Eosinophils Absolute: 0.1 10*3/uL (ref 0.0–0.5)
HCT: 30.8 % — ABNORMAL LOW (ref 38.4–49.9)
HGB: 10.4 g/dL — ABNORMAL LOW (ref 13.0–17.1)
LYMPH%: 26.1 % (ref 14.0–49.0)
MCH: 32 pg (ref 27.2–33.4)
MCHC: 33.8 g/dL (ref 32.0–36.0)
MCV: 94.8 fL (ref 79.3–98.0)
MONO#: 0.4 10*3/uL (ref 0.1–0.9)
MONO%: 10.6 % (ref 0.0–14.0)
NEUT%: 60.6 % (ref 39.0–75.0)
NEUTROS ABS: 2.2 10*3/uL (ref 1.5–6.5)
NRBC: 0 % (ref 0–0)
PLATELETS: 154 10*3/uL (ref 140–400)
RBC: 3.25 10*6/uL — AB (ref 4.20–5.82)
RDW: 18.2 % — ABNORMAL HIGH (ref 11.0–14.6)
WBC: 3.7 10*3/uL — AB (ref 4.0–10.3)
lymph#: 1 10*3/uL (ref 0.9–3.3)

## 2013-06-14 MED ORDER — SODIUM CHLORIDE 0.9 % IJ SOLN
10.0000 mL | INTRAMUSCULAR | Status: DC | PRN
  Administered 2013-06-14: 10 mL
  Filled 2013-06-14: qty 10

## 2013-06-14 MED ORDER — SODIUM CHLORIDE 0.9 % IV SOLN
Freq: Once | INTRAVENOUS | Status: AC
  Administered 2013-06-14: 14:00:00 via INTRAVENOUS

## 2013-06-14 MED ORDER — SODIUM CHLORIDE 0.9 % IV SOLN
64.0000 mg/m2 | Freq: Once | INTRAVENOUS | Status: AC
  Administered 2013-06-14: 108 mg via INTRAVENOUS
  Filled 2013-06-14: qty 18

## 2013-06-14 MED ORDER — ONDANSETRON 16 MG/50ML IVPB (CHCC)
INTRAVENOUS | Status: AC
  Filled 2013-06-14: qty 16

## 2013-06-14 MED ORDER — FAMOTIDINE IN NACL 20-0.9 MG/50ML-% IV SOLN
20.0000 mg | Freq: Once | INTRAVENOUS | Status: AC
  Administered 2013-06-14: 20 mg via INTRAVENOUS

## 2013-06-14 MED ORDER — DEXAMETHASONE SODIUM PHOSPHATE 20 MG/5ML IJ SOLN
20.0000 mg | Freq: Once | INTRAMUSCULAR | Status: AC
  Administered 2013-06-14: 20 mg via INTRAVENOUS

## 2013-06-14 MED ORDER — DIPHENHYDRAMINE HCL 50 MG/ML IJ SOLN
12.5000 mg | Freq: Once | INTRAMUSCULAR | Status: AC
  Administered 2013-06-14: 12.5 mg via INTRAVENOUS

## 2013-06-14 MED ORDER — CARBOPLATIN CHEMO INJECTION 450 MG/45ML
120.0000 mg | Freq: Once | INTRAVENOUS | Status: AC
  Administered 2013-06-14: 120 mg via INTRAVENOUS
  Filled 2013-06-14: qty 12

## 2013-06-14 MED ORDER — DIPHENHYDRAMINE HCL 50 MG/ML IJ SOLN
INTRAMUSCULAR | Status: AC
  Filled 2013-06-14: qty 1

## 2013-06-14 MED ORDER — DEXAMETHASONE SODIUM PHOSPHATE 20 MG/5ML IJ SOLN
INTRAMUSCULAR | Status: AC
  Filled 2013-06-14: qty 5

## 2013-06-14 MED ORDER — HEPARIN SOD (PORK) LOCK FLUSH 100 UNIT/ML IV SOLN
500.0000 [IU] | Freq: Once | INTRAVENOUS | Status: AC | PRN
  Administered 2013-06-14: 500 [IU]
  Filled 2013-06-14: qty 5

## 2013-06-14 MED ORDER — ONDANSETRON 16 MG/50ML IVPB (CHCC)
16.0000 mg | Freq: Once | INTRAVENOUS | Status: AC
  Administered 2013-06-14: 16 mg via INTRAVENOUS

## 2013-06-14 MED ORDER — FAMOTIDINE IN NACL 20-0.9 MG/50ML-% IV SOLN
INTRAVENOUS | Status: AC
  Filled 2013-06-14: qty 50

## 2013-06-14 NOTE — Telephone Encounter (Signed)
Called Cody [name], per pt request to let her know pt is ready for pick up.

## 2013-06-14 NOTE — Patient Instructions (Signed)
[facility] Discharge Instructions for Patients Receiving Chemotherapy  Today you received the following chemotherapy agents: Taxol and Carboplatin.  To help prevent nausea and vomiting after your treatment, we encourage you to take your nausea medication as prescribed.   If you develop nausea and vomiting that is not controlled by your nausea medication, call the clinic.   BELOW ARE SYMPTOMS THAT SHOULD BE REPORTED IMMEDIATELY:  *FEVER GREATER THAN 100.5 F  *CHILLS WITH OR WITHOUT FEVER  NAUSEA AND VOMITING THAT IS NOT CONTROLLED WITH YOUR NAUSEA MEDICATION  *UNUSUAL SHORTNESS OF BREATH  *UNUSUAL BRUISING OR BLEEDING  TENDERNESS IN MOUTH AND THROAT WITH OR WITHOUT PRESENCE OF ULCERS  *URINARY PROBLEMS  *BOWEL PROBLEMS  UNUSUAL RASH Items with * indicate a potential emergency and should be followed up as soon as possible.  Feel free to call the clinic you have any questions or concerns. The clinic phone number is [phone].    

## 2013-06-17 ENCOUNTER — Other Ambulatory Visit: Payer: Self-pay | Admitting: Lab

## 2013-06-17 ENCOUNTER — Ambulatory Visit: Payer: Self-pay

## 2013-06-20 ENCOUNTER — Encounter (HOSPITAL_COMMUNITY): Payer: Self-pay | Admitting: Dentistry

## 2013-06-27 ENCOUNTER — Encounter (HOSPITAL_COMMUNITY): Payer: Self-pay | Admitting: Dentistry

## 2013-06-27 ENCOUNTER — Ambulatory Visit (HOSPITAL_COMMUNITY): Payer: Self-pay | Admitting: Dentistry

## 2013-06-27 VITALS — BP 115/71 | HR 83 | Temp 98.8°F

## 2013-06-27 DIAGNOSIS — Z972 Presence of dental prosthetic device (complete) (partial): Secondary | ICD-10-CM

## 2013-06-27 DIAGNOSIS — R682 Dry mouth, unspecified: Secondary | ICD-10-CM

## 2013-06-27 DIAGNOSIS — K08109 Complete loss of teeth, unspecified cause, unspecified class: Secondary | ICD-10-CM

## 2013-06-27 DIAGNOSIS — Z463 Encounter for fitting and adjustment of dental prosthetic device: Secondary | ICD-10-CM

## 2013-06-27 DIAGNOSIS — K117 Disturbances of salivary secretion: Secondary | ICD-10-CM

## 2013-06-27 DIAGNOSIS — K000 Anodontia: Principal | ICD-10-CM

## 2013-06-27 DIAGNOSIS — K062 Gingival and edentulous alveolar ridge lesions associated with trauma: Secondary | ICD-10-CM

## 2013-06-27 NOTE — Patient Instructions (Signed)
Patient to keep dentures out if sore spots develop. Use salt water rinses as needed to aid healing. Return to clinic as scheduled for denture adjustment.   Call if problems arise before then.  Ronald F. Kulinski, DDS  

## 2013-06-27 NOTE — Progress Notes (Signed)
06/27/2013  Patient:            Cody [name] Date of Birth:  1945-10-22 MRN:                [id number]  BP 115/71  Pulse 83  Temp(Src) 98.8 F (37.1 C) (Oral)   [name] presents for evaluation of recently inserted upper and lower complete dentures.  SUBJECTIVE: Patient is complaining of denture irritation to the lower left and mandibular anterior areas.  OBJECTIVE: There is slight erythema to the lower left lingual in the area of the molars as well as mandible anterior alveolar ridge area. A very small bony spicule was noted to at the midline of the mandibular alveolar ridge. This was removed without complication. Procedure: Pressure indicating paste applied to dentures. Adjustments made as needed. Area over the torus was adjusted slightly. [place] Occlusion evaluated and adjustments made as needed for Centric Relation and protrusive strokes. Patient still with tendency to protrude to end to end position. Patient was instructed on finding maximum intercuspation position. Patient accepts results.  Patient to keep dentures out if sore spots develop. Use salt water rinses as needed to aid healing. Return to clinic as scheduled for denture adjustment.  Call if problems arise before then. Patient dismissed in stable condition.  Lenn Cal, DDS

## 2013-06-28 ENCOUNTER — Other Ambulatory Visit (HOSPITAL_BASED_OUTPATIENT_CLINIC_OR_DEPARTMENT_OTHER): Payer: Medicare Other

## 2013-06-28 ENCOUNTER — Telehealth: Payer: Self-pay | Admitting: Hematology and Oncology

## 2013-06-28 ENCOUNTER — Encounter: Payer: Self-pay | Admitting: *Deleted

## 2013-06-28 ENCOUNTER — Ambulatory Visit (HOSPITAL_BASED_OUTPATIENT_CLINIC_OR_DEPARTMENT_OTHER): Payer: Medicare Other

## 2013-06-28 ENCOUNTER — Ambulatory Visit (HOSPITAL_BASED_OUTPATIENT_CLINIC_OR_DEPARTMENT_OTHER): Payer: Medicare Other | Admitting: Hematology and Oncology

## 2013-06-28 VITALS — BP 126/66 | HR 77 | Temp 98.1°F | Resp 17 | Ht 69.0 in | Wt 124.2 lb

## 2013-06-28 DIAGNOSIS — R1310 Dysphagia, unspecified: Secondary | ICD-10-CM

## 2013-06-28 DIAGNOSIS — G609 Hereditary and idiopathic neuropathy, unspecified: Secondary | ICD-10-CM

## 2013-06-28 DIAGNOSIS — C7951 Secondary malignant neoplasm of bone: Secondary | ICD-10-CM

## 2013-06-28 DIAGNOSIS — C7952 Secondary malignant neoplasm of bone marrow: Secondary | ICD-10-CM

## 2013-06-28 DIAGNOSIS — G629 Polyneuropathy, unspecified: Secondary | ICD-10-CM

## 2013-06-28 DIAGNOSIS — E039 Hypothyroidism, unspecified: Secondary | ICD-10-CM

## 2013-06-28 DIAGNOSIS — Z5111 Encounter for antineoplastic chemotherapy: Secondary | ICD-10-CM

## 2013-06-28 DIAGNOSIS — C321 Malignant neoplasm of supraglottis: Secondary | ICD-10-CM

## 2013-06-28 DIAGNOSIS — D649 Anemia, unspecified: Secondary | ICD-10-CM

## 2013-06-28 DIAGNOSIS — D72819 Decreased white blood cell count, unspecified: Secondary | ICD-10-CM

## 2013-06-28 DIAGNOSIS — E871 Hypo-osmolality and hyponatremia: Secondary | ICD-10-CM

## 2013-06-28 LAB — COMPREHENSIVE METABOLIC PANEL (CC13)
ALK PHOS: 55 U/L (ref 40–150)
ALT: 11 U/L (ref 0–55)
AST: 20 U/L (ref 5–34)
Albumin: 3.7 g/dL (ref 3.5–5.0)
Anion Gap: 6 mEq/L (ref 3–11)
BUN: 13.8 mg/dL (ref 7.0–26.0)
CO2: 27 meq/L (ref 22–29)
Calcium: 9.5 mg/dL (ref 8.4–10.4)
Chloride: 100 mEq/L (ref 98–109)
Creatinine: 1 mg/dL (ref 0.7–1.3)
Glucose: 85 mg/dl (ref 70–140)
POTASSIUM: 4.1 meq/L (ref 3.5–5.1)
SODIUM: 133 meq/L — AB (ref 136–145)
TOTAL PROTEIN: 7.8 g/dL (ref 6.4–8.3)
Total Bilirubin: 0.64 mg/dL (ref 0.20–1.20)

## 2013-06-28 LAB — CBC WITH DIFFERENTIAL/PLATELET
BASO%: 0.3 % (ref 0.0–2.0)
Basophils Absolute: 0 10*3/uL (ref 0.0–0.1)
EOS%: 1 % (ref 0.0–7.0)
Eosinophils Absolute: 0 10*3/uL (ref 0.0–0.5)
HCT: 31 % — ABNORMAL LOW (ref 38.4–49.9)
HGB: 10.5 g/dL — ABNORMAL LOW (ref 13.0–17.1)
LYMPH%: 27.4 % (ref 14.0–49.0)
MCH: 32.5 pg (ref 27.2–33.4)
MCHC: 33.9 g/dL (ref 32.0–36.0)
MCV: 96 fL (ref 79.3–98.0)
MONO#: 0.4 10*3/uL (ref 0.1–0.9)
MONO%: 14.4 % — AB (ref 0.0–14.0)
NEUT#: 1.7 10*3/uL (ref 1.5–6.5)
NEUT%: 56.9 % (ref 39.0–75.0)
PLATELETS: 175 10*3/uL (ref 140–400)
RBC: 3.23 10*6/uL — AB (ref 4.20–5.82)
RDW: 18 % — ABNORMAL HIGH (ref 11.0–14.6)
WBC: 2.9 10*3/uL — ABNORMAL LOW (ref 4.0–10.3)
lymph#: 0.8 10*3/uL — ABNORMAL LOW (ref 0.9–3.3)

## 2013-06-28 LAB — TSH CHCC: TSH: 13.282 m(IU)/L — ABNORMAL HIGH (ref 0.320–4.118)

## 2013-06-28 LAB — T4, FREE: Free T4: 0.88 ng/dL (ref 0.80–1.80)

## 2013-06-28 MED ORDER — ONDANSETRON 16 MG/50ML IVPB (CHCC)
INTRAVENOUS | Status: AC
  Filled 2013-06-28: qty 16

## 2013-06-28 MED ORDER — ONDANSETRON 16 MG/50ML IVPB (CHCC)
16.0000 mg | Freq: Once | INTRAVENOUS | Status: AC
  Administered 2013-06-28: 16 mg via INTRAVENOUS

## 2013-06-28 MED ORDER — SODIUM CHLORIDE 0.9 % IJ SOLN
10.0000 mL | INTRAMUSCULAR | Status: DC | PRN
  Administered 2013-06-28: 10 mL
  Filled 2013-06-28: qty 10

## 2013-06-28 MED ORDER — CARBOPLATIN CHEMO INJECTION 450 MG/45ML
120.0000 mg | Freq: Once | INTRAVENOUS | Status: AC
  Administered 2013-06-28: 120 mg via INTRAVENOUS
  Filled 2013-06-28: qty 12

## 2013-06-28 MED ORDER — DIPHENHYDRAMINE HCL 50 MG/ML IJ SOLN
INTRAMUSCULAR | Status: AC
  Filled 2013-06-28: qty 1

## 2013-06-28 MED ORDER — FAMOTIDINE IN NACL 20-0.9 MG/50ML-% IV SOLN
INTRAVENOUS | Status: AC
  Filled 2013-06-28: qty 50

## 2013-06-28 MED ORDER — FAMOTIDINE IN NACL 20-0.9 MG/50ML-% IV SOLN
20.0000 mg | Freq: Once | INTRAVENOUS | Status: AC
  Administered 2013-06-28: 20 mg via INTRAVENOUS

## 2013-06-28 MED ORDER — SODIUM CHLORIDE 0.9 % IV SOLN
64.0000 mg/m2 | Freq: Once | INTRAVENOUS | Status: AC
  Administered 2013-06-28: 108 mg via INTRAVENOUS
  Filled 2013-06-28: qty 18

## 2013-06-28 MED ORDER — HEPARIN SOD (PORK) LOCK FLUSH 100 UNIT/ML IV SOLN
500.0000 [IU] | Freq: Once | INTRAVENOUS | Status: AC | PRN
  Administered 2013-06-28: 500 [IU]
  Filled 2013-06-28: qty 5

## 2013-06-28 MED ORDER — DEXAMETHASONE SODIUM PHOSPHATE 20 MG/5ML IJ SOLN
20.0000 mg | Freq: Once | INTRAMUSCULAR | Status: AC
  Administered 2013-06-28: 20 mg via INTRAVENOUS

## 2013-06-28 MED ORDER — DIPHENHYDRAMINE HCL 50 MG/ML IJ SOLN
12.5000 mg | Freq: Once | INTRAMUSCULAR | Status: AC
  Administered 2013-06-28: 12.5 mg via INTRAVENOUS

## 2013-06-28 MED ORDER — DEXAMETHASONE SODIUM PHOSPHATE 20 MG/5ML IJ SOLN
INTRAMUSCULAR | Status: AC
  Filled 2013-06-28: qty 5

## 2013-06-28 MED ORDER — SODIUM CHLORIDE 0.9 % IV SOLN
Freq: Once | INTRAVENOUS | Status: AC
  Administered 2013-06-28: 11:00:00 via INTRAVENOUS

## 2013-06-28 NOTE — Progress Notes (Signed)
[facility] OFFICE PROGRESS NOTE  Patient Care Team: Glendale Chard, MD as PCP - General (Internal Medicine) Izora Gala, MD as Attending Physician (Otolaryngology) Brooks Sailors, RN as Registered Nurse (Oncology) Heath Lark, MD as Consulting Physician (Hematology and Oncology)  DIAGNOSIS: Metastatic squamous cell carcinoma, ongoing palliative chemotherapy  SUMMARY OF ONCOLOGIC HISTORY: Oncology History    Carcinoma of supraglottis   Primary site: Larynx - Supraglottis (Right)   Staging method: AJCC 7th Edition   Clinical free text: T3N2M1   Clinical: (T3, N2c, M1)   Summary: (T3, N2c, M1)      Carcinoma of supraglottis   07/03/2012 Imaging Primary malignancy suspected in the supraglottic, glottic and hypopharyngeal region with necrotic bilateral adenopathy larger on the right.     07/09/2012 Initial Diagnosis Carcinoma of supraglottis from biopsy, HPV negative   08/13/2012 Surgery He underwent laryngectomy and bilateral LN dissection. There were bilateral LN involvement and 10/25 LN were positive with extracapsular extension staging T3N2Mx   09/20/2012 - 10/29/2012 Chemotherapy he received concurrent chemo/Rt with weekly cisplatin   02/23/2013 Imaging Staging PET/Ct scan showed focal hypermetabolic bone lesions from metastatic cancer   03/09/2013 Procedure Placement of infusaport under IR   03/15/2013 -  Chemotherapy Start cycle 1 palliative chemotherapy with Weekly Carboplatin & Paclitacel with mild dosage adjustment due to elevated creatinine   05/03/2013 Imaging Repeat PEt/CT showed no evidence of local laryngeal squamous cell carcinoma in the neck & interval resolution of metabolic activity associated skeletal metastases.    05/10/2013 Adverse Reaction Dose of Paclitzel was reduced by 20% due to worsening neuropathy    INTERVAL HISTORY: Cody [name] 68 y.o. male returns for further followup. His symptoms are about the same. He has persistent dysphagia but was able  to maintain his weight. He has rare mild peripheral neuropathy but they were not worse. Denies any pain. He does have numbness and tingling sensation in the tips of fingers. He denies any recent fever, chills, night sweats or abnormal weight loss  I have reviewed the past medical history, past surgical history, social history and family history with the patient and they are unchanged from previous note.  ALLERGIES:  has No Known Allergies.  MEDICATIONS:  Current Outpatient Prescriptions  Medication Sig Dispense Refill  . acetaminophen (TYLENOL) 325 MG tablet Take 650 mg by mouth daily as needed for pain.      Marland Kitchen HYDROcodone-acetaminophen (NORCO) 5-325 MG per tablet Take 1-2 tablets by mouth every 4 (four) hours as needed for pain.  25 tablet  1  . levothyroxine (SYNTHROID, LEVOTHROID) 50 MCG tablet Take 1 tablet (50 mcg total) by mouth daily before breakfast.  30 tablet  3  . Multiple Vitamin (MULTIVITAMIN) tablet Take 1 tablet by mouth daily.      . ondansetron (ZOFRAN) 8 MG tablet Take 1 tablet (8 mg total) by mouth every 8 (eight) hours as needed for nausea.  30 tablet  1  . polyethylene glycol powder (GLYCOLAX/MIRALAX) powder       . promethazine (PHENERGAN) 25 MG suppository Place 1 suppository (25 mg total) rectally every 6 (six) hours as needed for nausea.  12 each  0  . senna (SENOKOT) 8.6 MG tablet Take 2 tablets (17.2 mg total) by mouth 2 (two) times daily.  60 tablet  3   No current facility-administered medications for this visit.   Facility-Administered Medications Ordered in Other Visits  Medication Dose Route Frequency Provider Last Rate Last Dose  . sodium chloride 0.9 %  injection 10 mL  10 mL Intracatheter PRN Heath Lark, MD   10 mL at 03/15/13 1808    REVIEW OF SYSTEMS:   Constitutional: Denies fevers, chills or abnormal weight loss Eyes: Denies blurriness of vision Ears, nose, mouth, throat, and face: Denies mucositis or sore throat Respiratory: Denies cough, dyspnea  or wheezes Cardiovascular: Denies palpitation, chest discomfort or lower extremity swelling Gastrointestinal:  Denies nausea, heartburn or change in bowel habits Skin: Denies abnormal skin rashes Lymphatics: Denies new lymphadenopathy or easy bruising Behavioral/Psych: Mood is stable, no new changes  All other systems were reviewed with the patient and are negative.  PHYSICAL EXAMINATION: ECOG PERFORMANCE STATUS: 1 - Symptomatic but completely ambulatory  Filed Vitals:   06/28/13 0916  BP: 126/66  Pulse: 77  Temp: 98.1 F (36.7 C)  Resp: 17   Filed Weights   06/28/13 0916  Weight: 124 lb 3.2 oz (56.337 kg)    GENERAL:alert, no distress and comfortable. He looks mildly thin and cachectic SKIN: skin color, texture, turgor are normal, no rashes or significant lesions EYES: normal, Conjunctiva are pink and non-injected, sclera clear OROPHARYNX:no exudate, no erythema and lips, buccal mucosa, and tongue normal  NECK: supple, tracheostomy site looks okay, significant radiation-induced skin thickening and fibrosis LYMPH:  no palpable lymphadenopathy in the cervical, axillary or inguinal LUNGS: clear to auscultation and percussion with normal breathing effort HEART: regular rate & rhythm and no murmurs and no lower extremity edema ABDOMEN:abdomen soft, non-tender and normal bowel sounds Musculoskeletal:no cyanosis of digits and no clubbing  NEURO: alert & oriented x 3 with fluent speech, no focal motor/sensory deficits  LABORATORY DATA:  I have reviewed the data as listed    Component Value Date/Time   NA 133* 06/14/2013 1255   NA 136 08/19/2012 0625   K 4.1 06/14/2013 1255   K 4.3 08/19/2012 0625   CL 98 11/15/2012 0941   CL 100 08/19/2012 0625   CO2 26 06/14/2013 1255   CO2 28 08/19/2012 0625   GLUCOSE 83 06/14/2013 1255   GLUCOSE 90 11/15/2012 0941   GLUCOSE 109* 08/19/2012 0625   BUN 10.6 06/14/2013 1255   BUN 9 08/19/2012 0625   CREATININE 1.1 06/14/2013 1255   CREATININE 0.69 08/19/2012  0625   CALCIUM 9.0 06/14/2013 1255   CALCIUM 8.3* 08/20/2012 0542   PROT 7.7 06/14/2013 1255   PROT 6.9 08/19/2012 0625   ALBUMIN 3.6 06/14/2013 1255   ALBUMIN 2.4* 08/19/2012 0625   AST 18 06/14/2013 1255   AST 25 08/19/2012 0625   ALT 12 06/14/2013 1255   ALT 20 08/19/2012 0625   ALKPHOS 55 06/14/2013 1255   ALKPHOS 57 08/19/2012 0625   BILITOT 0.82 06/14/2013 1255   BILITOT 0.6 08/19/2012 0625   GFRNONAA >90 08/19/2012 0625   GFRAA >90 08/19/2012 0625    No results found for this basename: SPEP,  UPEP,   kappa and lambda light chains    Lab Results  Component Value Date   WBC 2.9* 06/28/2013   NEUTROABS 1.7 06/28/2013   HGB 10.5* 06/28/2013   HCT 31.0* 06/28/2013   MCV 96.0 06/28/2013   PLT 175 06/28/2013      Chemistry      Component Value Date/Time   NA 133* 06/14/2013 1255   NA 136 08/19/2012 0625   K 4.1 06/14/2013 1255   K 4.3 08/19/2012 0625   CL 98 11/15/2012 0941   CL 100 08/19/2012 0625   CO2 26 06/14/2013 1255   CO2 28  08/19/2012 0625   BUN 10.6 06/14/2013 1255   BUN 9 08/19/2012 0625   CREATININE 1.1 06/14/2013 1255   CREATININE 0.69 08/19/2012 0625      Component Value Date/Time   CALCIUM 9.0 06/14/2013 1255   CALCIUM 8.3* 08/20/2012 0542   ALKPHOS 55 06/14/2013 1255   ALKPHOS 57 08/19/2012 0625   AST 18 06/14/2013 1255   AST 25 08/19/2012 0625   ALT 12 06/14/2013 1255   ALT 20 08/19/2012 0625   BILITOT 0.82 06/14/2013 1255   BILITOT 0.6 08/19/2012 0625     ASSESSMENT & PLAN:  #1 metastatic laryngeal cancer I recommend we continue treatment with current dosage adjustment. I will order a PET CT scan to be done before his next visit to reassess progress to treatment  I will continue his chemotherapy at current dosage adjustment in each treatment cycle to 2 weeks on 1 week off. #2 leukopenia This is likely due to recent treatment. The patient denies recent history of fevers, cough, chills, diarrhea or dysuria. He is asymptomatic from the leukopenia. I will observe for now.  I will continue the  chemotherapy at current dose without dosage adjustment. For future cycles, I will continue  his treatment to 2 weeks on 1 week off. #3 significant hypothyroidism I have increased his dose recently. Recheck thyroid function test is pending. #4 anemia This is likely due to recent treatment. The patient denies recent history of bleeding such as epistaxis, hematuria or hematochezia. He is asymptomatic from the anemia. I will observe for now.  he does not require transfusion now. I will continue the chemotherapy at current dose without dosage adjustment.  If the anemia gets progressive worse in the future, I might have to delay his treatment or adjust the chemotherapy dose. #5 bony metastasis I will hold off giving him IV bisphosphonates to reduce the risk of osteonecrosis of the jaw due to recent radiation treatment. The patient is asymptomatic at this point in time. #6 hyponatremia This is likely due to reduced salt intake and hypothyroidism. It is stable. We will observe.  #7 peripheral neuropathy This is related to side effects of treatment. It is only mild grade 1. We will observe.    Orders Placed This Encounter  Procedures  . NM PET Image Restag (PS) Skull Base To Thigh    Standing Status: Future     Number of Occurrences:      Standing Expiration Date: 08/28/2014    Order Specific Question:  Reason for Exam (SYMPTOM  OR DIAGNOSIS REQUIRED)    Answer:  metastatic epiglottic cancer, assess response to Rx    Order Specific Question:  Preferred imaging location?    Answer:  [facility]   All questions were answered. The patient knows to call the clinic with any problems, questions or concerns. No barriers to learning was detected.    [place], [place], MD 06/28/2013 10:03 AM

## 2013-06-28 NOTE — Telephone Encounter (Signed)
gv and printed appt sched and vs for pt for Jan and Feb....sed added tx...Marland KitchenMarland KitchenMarland Kitchenpet has been entered,

## 2013-06-28 NOTE — Patient Instructions (Addendum)
[facility] Center Discharge Instructions for Patients Receiving Chemotherapy  Today you received the following chemotherapy agents Taxol/Carboplatin To help prevent nausea and vomiting after your treatment, we encourage you to take your nausea medication as prescribed.     If you develop nausea and vomiting that is not controlled by your nausea medication, call the clinic.   BELOW ARE SYMPTOMS THAT SHOULD BE REPORTED IMMEDIATELY:  *FEVER GREATER THAN 100.5 F  *CHILLS WITH OR WITHOUT FEVER  NAUSEA AND VOMITING THAT IS NOT CONTROLLED WITH YOUR NAUSEA MEDICATION  *UNUSUAL SHORTNESS OF BREATH  *UNUSUAL BRUISING OR BLEEDING  TENDERNESS IN MOUTH AND THROAT WITH OR WITHOUT PRESENCE OF ULCERS  *URINARY PROBLEMS  *BOWEL PROBLEMS  UNUSUAL RASH Items with * indicate a potential emergency and should be followed up as soon as possible.  Feel free to call the clinic should you have any questions or concerns. The clinic phone number is [phone].    

## 2013-06-28 NOTE — Telephone Encounter (Signed)
gv and printed appt sched and avs fo rpt for Jan adn Feb... °

## 2013-06-28 NOTE — Progress Notes (Signed)
Met with patient and his granddaughter during scheduled appt with Dr. Alvy Bimler to provide support and care continuity.  Continuing to navigate as L2 (treatments established) patient.  Gayleen Orem, RN, BSN, [facility] Oncology Navigator [phone]

## 2013-07-05 ENCOUNTER — Other Ambulatory Visit: Payer: Self-pay | Admitting: *Deleted

## 2013-07-05 ENCOUNTER — Ambulatory Visit (HOSPITAL_BASED_OUTPATIENT_CLINIC_OR_DEPARTMENT_OTHER): Payer: Medicare Other

## 2013-07-05 VITALS — BP 128/68 | HR 82 | Temp 97.4°F | Resp 18 | Ht 69.0 in

## 2013-07-05 DIAGNOSIS — Z5111 Encounter for antineoplastic chemotherapy: Secondary | ICD-10-CM

## 2013-07-05 DIAGNOSIS — C321 Malignant neoplasm of supraglottis: Secondary | ICD-10-CM

## 2013-07-05 DIAGNOSIS — C7951 Secondary malignant neoplasm of bone: Secondary | ICD-10-CM

## 2013-07-05 DIAGNOSIS — G629 Polyneuropathy, unspecified: Secondary | ICD-10-CM

## 2013-07-05 DIAGNOSIS — C7952 Secondary malignant neoplasm of bone marrow: Secondary | ICD-10-CM

## 2013-07-05 LAB — CBC WITH DIFFERENTIAL/PLATELET
BASO%: 0.3 % (ref 0.0–2.0)
BASOS ABS: 0 10*3/uL (ref 0.0–0.1)
EOS%: 1.3 % (ref 0.0–7.0)
Eosinophils Absolute: 0 10*3/uL (ref 0.0–0.5)
HEMATOCRIT: 31.2 % — AB (ref 38.4–49.9)
HEMOGLOBIN: 10.6 g/dL — AB (ref 13.0–17.1)
LYMPH%: 28.4 % (ref 14.0–49.0)
MCH: 32.7 pg (ref 27.2–33.4)
MCHC: 34 g/dL (ref 32.0–36.0)
MCV: 96.3 fL (ref 79.3–98.0)
MONO#: 0.3 10*3/uL (ref 0.1–0.9)
MONO%: 9.2 % (ref 0.0–14.0)
NEUT#: 1.8 10*3/uL (ref 1.5–6.5)
NEUT%: 60.8 % (ref 39.0–75.0)
PLATELETS: 183 10*3/uL (ref 140–400)
RBC: 3.24 10*6/uL — ABNORMAL LOW (ref 4.20–5.82)
RDW: 17.3 % — ABNORMAL HIGH (ref 11.0–14.6)
WBC: 3 10*3/uL — ABNORMAL LOW (ref 4.0–10.3)
lymph#: 0.9 10*3/uL (ref 0.9–3.3)
nRBC: 0 % (ref 0–0)

## 2013-07-05 LAB — COMPREHENSIVE METABOLIC PANEL (CC13)
ALK PHOS: 60 U/L (ref 40–150)
ALT: 13 U/L (ref 0–55)
AST: 20 U/L (ref 5–34)
Albumin: 3.8 g/dL (ref 3.5–5.0)
Anion Gap: 7 mEq/L (ref 3–11)
BILIRUBIN TOTAL: 0.61 mg/dL (ref 0.20–1.20)
BUN: 12.8 mg/dL (ref 7.0–26.0)
CO2: 26 mEq/L (ref 22–29)
Calcium: 9.5 mg/dL (ref 8.4–10.4)
Chloride: 102 mEq/L (ref 98–109)
Creatinine: 1 mg/dL (ref 0.7–1.3)
Glucose: 88 mg/dl (ref 70–140)
Potassium: 4 mEq/L (ref 3.5–5.1)
Sodium: 135 mEq/L — ABNORMAL LOW (ref 136–145)
TOTAL PROTEIN: 7.8 g/dL (ref 6.4–8.3)

## 2013-07-05 MED ORDER — SODIUM CHLORIDE 0.9 % IJ SOLN
10.0000 mL | INTRAMUSCULAR | Status: DC | PRN
  Administered 2013-07-05: 10 mL
  Filled 2013-07-05: qty 10

## 2013-07-05 MED ORDER — DIPHENHYDRAMINE HCL 50 MG/ML IJ SOLN
INTRAMUSCULAR | Status: AC
  Filled 2013-07-05: qty 1

## 2013-07-05 MED ORDER — HEPARIN SOD (PORK) LOCK FLUSH 100 UNIT/ML IV SOLN
500.0000 [IU] | Freq: Once | INTRAVENOUS | Status: AC | PRN
  Administered 2013-07-05: 500 [IU]
  Filled 2013-07-05: qty 5

## 2013-07-05 MED ORDER — DEXAMETHASONE SODIUM PHOSPHATE 20 MG/5ML IJ SOLN
20.0000 mg | Freq: Once | INTRAMUSCULAR | Status: AC
  Administered 2013-07-05: 20 mg via INTRAVENOUS

## 2013-07-05 MED ORDER — SODIUM CHLORIDE 0.9 % IV SOLN
Freq: Once | INTRAVENOUS | Status: AC
  Administered 2013-07-05: 10:00:00 via INTRAVENOUS

## 2013-07-05 MED ORDER — FAMOTIDINE IN NACL 20-0.9 MG/50ML-% IV SOLN
INTRAVENOUS | Status: AC
  Filled 2013-07-05: qty 50

## 2013-07-05 MED ORDER — ONDANSETRON 16 MG/50ML IVPB (CHCC)
INTRAVENOUS | Status: AC
  Filled 2013-07-05: qty 16

## 2013-07-05 MED ORDER — DEXAMETHASONE SODIUM PHOSPHATE 20 MG/5ML IJ SOLN
INTRAMUSCULAR | Status: AC
  Filled 2013-07-05: qty 5

## 2013-07-05 MED ORDER — PACLITAXEL CHEMO INJECTION 300 MG/50ML
64.0000 mg/m2 | Freq: Once | INTRAVENOUS | Status: AC
  Administered 2013-07-05: 108 mg via INTRAVENOUS
  Filled 2013-07-05: qty 18

## 2013-07-05 MED ORDER — ONDANSETRON 16 MG/50ML IVPB (CHCC)
16.0000 mg | Freq: Once | INTRAVENOUS | Status: AC
  Administered 2013-07-05: 16 mg via INTRAVENOUS

## 2013-07-05 MED ORDER — DIPHENHYDRAMINE HCL 50 MG/ML IJ SOLN
12.5000 mg | Freq: Once | INTRAMUSCULAR | Status: AC
  Administered 2013-07-05: 10:00:00 via INTRAVENOUS

## 2013-07-05 MED ORDER — SODIUM CHLORIDE 0.9 % IV SOLN
120.0000 mg | Freq: Once | INTRAVENOUS | Status: AC
  Administered 2013-07-05: 120 mg via INTRAVENOUS
  Filled 2013-07-05: qty 12

## 2013-07-05 MED ORDER — FAMOTIDINE IN NACL 20-0.9 MG/50ML-% IV SOLN
20.0000 mg | Freq: Once | INTRAVENOUS | Status: AC
  Administered 2013-07-05: 20 mg via INTRAVENOUS

## 2013-07-05 NOTE — Patient Instructions (Addendum)
[facility] Discharge Instructions for Patients Receiving Chemotherapy  Today you received the following chemotherapy agents Taxol/ Carboplatin To help prevent nausea and vomiting after your treatment, we encourage you to take your nausea medication as directed/prescribed. If you develop nausea and vomiting that is not controlled by your nausea medication, call the clinic.   BELOW ARE SYMPTOMS THAT SHOULD BE REPORTED IMMEDIATELY:  *FEVER GREATER THAN 100.5 F  *CHILLS WITH OR WITHOUT FEVER  NAUSEA AND VOMITING THAT IS NOT CONTROLLED WITH YOUR NAUSEA MEDICATION  *UNUSUAL SHORTNESS OF BREATH  *UNUSUAL BRUISING OR BLEEDING  TENDERNESS IN MOUTH AND THROAT WITH OR WITHOUT PRESENCE OF ULCERS  *URINARY PROBLEMS  *BOWEL PROBLEMS  UNUSUAL RASH Items with * indicate a potential emergency and should be followed up as soon as possible.  Feel free to call the clinic you have any questions or concerns. The clinic phone number is [phone].

## 2013-07-11 ENCOUNTER — Telehealth: Payer: Self-pay | Admitting: Hematology and Oncology

## 2013-07-11 ENCOUNTER — Encounter (HOSPITAL_COMMUNITY): Payer: Self-pay | Admitting: Dentistry

## 2013-07-11 NOTE — Telephone Encounter (Signed)
pt dtr called today to see if pt should be having tx 2/3 as it is not listed on his schedule. pt is not  having tx 2/3, however per 1/20 pof pt is to start tox again on 2/10 - 2weeks on and 1week off. tx added for 2/11 and 2/18 due to chemo books closed 2/10. dtr aware. message to Sharyn Lull to see if there is any way tx can be done 2/10. dtr aware if tx can be done 2/10 after f/u I will contact her. othewise pt will see NG 2/10 and have tx 2/11.

## 2013-07-12 ENCOUNTER — Ambulatory Visit (HOSPITAL_BASED_OUTPATIENT_CLINIC_OR_DEPARTMENT_OTHER): Payer: Medicare Other

## 2013-07-12 ENCOUNTER — Other Ambulatory Visit (HOSPITAL_BASED_OUTPATIENT_CLINIC_OR_DEPARTMENT_OTHER): Payer: Medicare Other

## 2013-07-12 VITALS — BP 127/68 | HR 84 | Temp 97.8°F

## 2013-07-12 DIAGNOSIS — Z95828 Presence of other vascular implants and grafts: Secondary | ICD-10-CM

## 2013-07-12 DIAGNOSIS — Z452 Encounter for adjustment and management of vascular access device: Secondary | ICD-10-CM

## 2013-07-12 DIAGNOSIS — C321 Malignant neoplasm of supraglottis: Secondary | ICD-10-CM

## 2013-07-12 DIAGNOSIS — D649 Anemia, unspecified: Secondary | ICD-10-CM

## 2013-07-12 DIAGNOSIS — G629 Polyneuropathy, unspecified: Secondary | ICD-10-CM

## 2013-07-12 LAB — COMPREHENSIVE METABOLIC PANEL (CC13)
ALK PHOS: 61 U/L (ref 40–150)
ALT: 12 U/L (ref 0–55)
AST: 19 U/L (ref 5–34)
Albumin: 3.8 g/dL (ref 3.5–5.0)
Anion Gap: 7 mEq/L (ref 3–11)
BILIRUBIN TOTAL: 0.56 mg/dL (ref 0.20–1.20)
BUN: 14.1 mg/dL (ref 7.0–26.0)
CO2: 26 mEq/L (ref 22–29)
Calcium: 9.5 mg/dL (ref 8.4–10.4)
Chloride: 102 mEq/L (ref 98–109)
Creatinine: 1 mg/dL (ref 0.7–1.3)
Glucose: 90 mg/dl (ref 70–140)
POTASSIUM: 4.2 meq/L (ref 3.5–5.1)
Sodium: 134 mEq/L — ABNORMAL LOW (ref 136–145)
Total Protein: 7.7 g/dL (ref 6.4–8.3)

## 2013-07-12 LAB — CBC WITH DIFFERENTIAL/PLATELET
BASO%: 0.4 % (ref 0.0–2.0)
BASOS ABS: 0 10*3/uL (ref 0.0–0.1)
EOS%: 1.4 % (ref 0.0–7.0)
Eosinophils Absolute: 0 10*3/uL (ref 0.0–0.5)
HCT: 30.2 % — ABNORMAL LOW (ref 38.4–49.9)
HGB: 10.3 g/dL — ABNORMAL LOW (ref 13.0–17.1)
LYMPH#: 0.6 10*3/uL — AB (ref 0.9–3.3)
LYMPH%: 25.9 % (ref 14.0–49.0)
MCH: 34.3 pg — AB (ref 27.2–33.4)
MCHC: 34.1 g/dL (ref 32.0–36.0)
MCV: 100.4 fL — ABNORMAL HIGH (ref 79.3–98.0)
MONO#: 0.2 10*3/uL (ref 0.1–0.9)
MONO%: 8.8 % (ref 0.0–14.0)
NEUT#: 1.4 10*3/uL — ABNORMAL LOW (ref 1.5–6.5)
NEUT%: 63.5 % (ref 39.0–75.0)
Platelets: 180 10*3/uL (ref 140–400)
RBC: 3.01 10*6/uL — AB (ref 4.20–5.82)
RDW: 18.5 % — AB (ref 11.0–14.6)
WBC: 2.3 10*3/uL — ABNORMAL LOW (ref 4.0–10.3)

## 2013-07-12 MED ORDER — SODIUM CHLORIDE 0.9 % IJ SOLN
10.0000 mL | INTRAMUSCULAR | Status: DC | PRN
  Administered 2013-07-12: 10 mL via INTRAVENOUS
  Filled 2013-07-12: qty 10

## 2013-07-12 MED ORDER — HEPARIN SOD (PORK) LOCK FLUSH 100 UNIT/ML IV SOLN
500.0000 [IU] | Freq: Once | INTRAVENOUS | Status: AC
  Administered 2013-07-12: 500 [IU] via INTRAVENOUS
  Filled 2013-07-12: qty 5

## 2013-07-12 NOTE — Patient Instructions (Signed)

## 2013-07-15 ENCOUNTER — Ambulatory Visit (HOSPITAL_COMMUNITY)
Admission: RE | Admit: 2013-07-15 | Discharge: 2013-07-15 | Disposition: A | Discharge status: Home / Self Care | Payer: Medicare Other | Source: Ambulatory Visit | Attending: Hematology and Oncology | Admitting: Hematology and Oncology

## 2013-07-15 ENCOUNTER — Encounter (HOSPITAL_COMMUNITY): Payer: Self-pay

## 2013-07-15 DIAGNOSIS — C321 Malignant neoplasm of supraglottis: Secondary | ICD-10-CM

## 2013-07-15 DIAGNOSIS — C329 Malignant neoplasm of larynx, unspecified: Secondary | ICD-10-CM | POA: Insufficient documentation

## 2013-07-15 DIAGNOSIS — Z9089 Acquired absence of other organs: Secondary | ICD-10-CM | POA: Insufficient documentation

## 2013-07-15 LAB — GLUCOSE, CAPILLARY: GLUCOSE-CAPILLARY: 97 mg/dL (ref 70–99)

## 2013-07-15 MED ORDER — FLUDEOXYGLUCOSE F - 18 (FDG) INJECTION
6.1000 | Freq: Once | INTRAVENOUS | Status: AC | PRN
  Administered 2013-07-15: 6.1 via INTRAVENOUS

## 2013-07-19 ENCOUNTER — Encounter: Payer: Self-pay | Admitting: *Deleted

## 2013-07-19 ENCOUNTER — Ambulatory Visit (HOSPITAL_COMMUNITY): Payer: Self-pay | Admitting: Dentistry

## 2013-07-19 ENCOUNTER — Encounter (HOSPITAL_COMMUNITY): Payer: Self-pay | Admitting: Dentistry

## 2013-07-19 ENCOUNTER — Other Ambulatory Visit (HOSPITAL_BASED_OUTPATIENT_CLINIC_OR_DEPARTMENT_OTHER): Payer: Medicare Other

## 2013-07-19 ENCOUNTER — Ambulatory Visit (HOSPITAL_BASED_OUTPATIENT_CLINIC_OR_DEPARTMENT_OTHER): Payer: Medicare Other | Admitting: Hematology and Oncology

## 2013-07-19 ENCOUNTER — Encounter: Payer: Self-pay | Admitting: Hematology and Oncology

## 2013-07-19 ENCOUNTER — Other Ambulatory Visit: Payer: Self-pay | Admitting: Hematology and Oncology

## 2013-07-19 ENCOUNTER — Telehealth: Payer: Self-pay | Admitting: Hematology and Oncology

## 2013-07-19 VITALS — BP 119/69 | HR 81 | Temp 98.1°F

## 2013-07-19 VITALS — BP 122/70 | HR 79 | Temp 96.9°F | Resp 18 | Ht 69.0 in | Wt 124.0 lb

## 2013-07-19 DIAGNOSIS — R682 Dry mouth, unspecified: Secondary | ICD-10-CM

## 2013-07-19 DIAGNOSIS — G629 Polyneuropathy, unspecified: Secondary | ICD-10-CM

## 2013-07-19 DIAGNOSIS — K062 Gingival and edentulous alveolar ridge lesions associated with trauma: Secondary | ICD-10-CM

## 2013-07-19 DIAGNOSIS — C321 Malignant neoplasm of supraglottis: Secondary | ICD-10-CM

## 2013-07-19 DIAGNOSIS — D72819 Decreased white blood cell count, unspecified: Secondary | ICD-10-CM

## 2013-07-19 DIAGNOSIS — K08109 Complete loss of teeth, unspecified cause, unspecified class: Secondary | ICD-10-CM

## 2013-07-19 DIAGNOSIS — G609 Hereditary and idiopathic neuropathy, unspecified: Secondary | ICD-10-CM

## 2013-07-19 DIAGNOSIS — K117 Disturbances of salivary secretion: Secondary | ICD-10-CM

## 2013-07-19 DIAGNOSIS — Z463 Encounter for fitting and adjustment of dental prosthetic device: Secondary | ICD-10-CM

## 2013-07-19 DIAGNOSIS — C7952 Secondary malignant neoplasm of bone marrow: Secondary | ICD-10-CM

## 2013-07-19 DIAGNOSIS — Z972 Presence of dental prosthetic device (complete) (partial): Secondary | ICD-10-CM

## 2013-07-19 DIAGNOSIS — K000 Anodontia: Principal | ICD-10-CM

## 2013-07-19 DIAGNOSIS — E039 Hypothyroidism, unspecified: Secondary | ICD-10-CM

## 2013-07-19 DIAGNOSIS — C7951 Secondary malignant neoplasm of bone: Secondary | ICD-10-CM

## 2013-07-19 DIAGNOSIS — D649 Anemia, unspecified: Secondary | ICD-10-CM

## 2013-07-19 LAB — COMPREHENSIVE METABOLIC PANEL (CC13)
ALBUMIN: 3.8 g/dL (ref 3.5–5.0)
ALK PHOS: 55 U/L (ref 40–150)
ALT: 11 U/L (ref 0–55)
ANION GAP: 7 meq/L (ref 3–11)
AST: 20 U/L (ref 5–34)
BILIRUBIN TOTAL: 0.68 mg/dL (ref 0.20–1.20)
BUN: 10.7 mg/dL (ref 7.0–26.0)
CO2: 28 mEq/L (ref 22–29)
Calcium: 9.7 mg/dL (ref 8.4–10.4)
Chloride: 102 mEq/L (ref 98–109)
Creatinine: 1.2 mg/dL (ref 0.7–1.3)
Glucose: 85 mg/dl (ref 70–140)
POTASSIUM: 4.3 meq/L (ref 3.5–5.1)
Sodium: 136 mEq/L (ref 136–145)
TOTAL PROTEIN: 8 g/dL (ref 6.4–8.3)

## 2013-07-19 LAB — CBC WITH DIFFERENTIAL/PLATELET
BASO%: 0.3 % (ref 0.0–2.0)
Basophils Absolute: 0 10*3/uL (ref 0.0–0.1)
EOS%: 1.3 % (ref 0.0–7.0)
Eosinophils Absolute: 0.1 10*3/uL (ref 0.0–0.5)
HCT: 33.1 % — ABNORMAL LOW (ref 38.4–49.9)
HGB: 11.2 g/dL — ABNORMAL LOW (ref 13.0–17.1)
LYMPH%: 21.5 % (ref 14.0–49.0)
MCH: 33.3 pg (ref 27.2–33.4)
MCHC: 33.8 g/dL (ref 32.0–36.0)
MCV: 98.5 fL — ABNORMAL HIGH (ref 79.3–98.0)
MONO#: 0.5 10*3/uL (ref 0.1–0.9)
MONO%: 12.3 % (ref 0.0–14.0)
NEUT#: 2.5 10*3/uL (ref 1.5–6.5)
NEUT%: 64.6 % (ref 39.0–75.0)
PLATELETS: 161 10*3/uL (ref 140–400)
RBC: 3.36 10*6/uL — AB (ref 4.20–5.82)
RDW: 17.1 % — AB (ref 11.0–14.6)
WBC: 3.8 10*3/uL — AB (ref 4.0–10.3)
lymph#: 0.8 10*3/uL — ABNORMAL LOW (ref 0.9–3.3)

## 2013-07-19 NOTE — Progress Notes (Addendum)
To provide support and care continuity, met with pt and his dtr curing scheduled appt with Dr. Alvy Bimler.  Per Dr. Alvy Bimler, pt no longer needs chemo, to see Dr. Alvy Bimler in 3 months, preceded by C.  Pt stated he will keep port for next 3 months, come in for Q6wk flush.  Initiating navigation as L3 (treatments completed) patient with this encounter.  Gayleen Orem, RN, BSN, [facility] Oncology Navigator [phone]

## 2013-07-19 NOTE — Progress Notes (Signed)
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     07/09/2012 Initial Diagnosis Carcinoma of supraglottis from biopsy, HPV negative   08/13/2012 Surgery He underwent laryngectomy and bilateral LN dissection. There were bilateral LN involvement and 10/25 LN were positive with extracapsular extension staging T3N2Mx   09/20/2012 - 10/29/2012 Chemotherapy he received concurrent chemo/Rt with weekly cisplatin   02/23/2013 Imaging Staging PET/Ct scan showed focal hypermetabolic bone lesions from metastatic cancer   03/09/2013 Procedure Placement of infusaport under IR   03/15/2013 - 07/19/2013 Chemotherapy Start cycle 1 palliative chemotherapy with Weekly Carboplatin & Paclitacel with mild dosage adjustment due to elevated creatinine. Chemotherapy was subsequently discontinued as [name]   05/03/2013 Imaging Repeat PEt/CT showed no evidence of local laryngeal squamous cell carcinoma in the neck & interval resolution of metabolic activity associated skeletal metastases.    05/10/2013 Adverse Reaction Dose of Paclitxel was reduced by 20% due to worsening neuropathy   07/15/2013 Imaging  Repeat PET CT scan show resolution of his disease in the bones. There is mild inflammatory/infectious process in his lungs but the patient is not symptomatic    INTERVAL HISTORY: Cody [name] 68 y.o. male returns for further followup. He hasvmild peripheral neuropathy but it is not worse. He has persistent dysphagia but was able to maintain his weight.  I have reviewed the past medical history, past surgical history, social history and family history with the patient and they are unchanged from previous note.  ALLERGIES:  has No Known Allergies.  MEDICATIONS:  Current Outpatient Prescriptions  Medication Sig Dispense Refill  . acetaminophen (TYLENOL) 325 MG tablet Take 650 mg by mouth daily as needed for pain.      Marland Kitchen levothyroxine (SYNTHROID, LEVOTHROID) 50 MCG tablet Take 1 tablet (50 mcg total) by mouth daily before breakfast.  30 tablet  3  . Multiple Vitamin (MULTIVITAMIN) tablet Take 1 tablet by mouth daily.      . ondansetron (ZOFRAN) 8 MG tablet Take 1 tablet (8 mg total) by mouth every 8 (eight) hours as needed for nausea.  30 tablet  1  . polyethylene glycol powder (GLYCOLAX/MIRALAX) powder       . promethazine (PHENERGAN) 25 MG suppository Place 1 suppository (25 mg total) rectally every 6 (six) hours as needed for nausea.  12 each  0  . senna (SENOKOT) 8.6 MG tablet Take 2 tablets (17.2 mg total) by mouth 2 (two) times daily.  60 tablet  3  . HYDROcodone-acetaminophen (NORCO) 5-325 MG per tablet Take 1-2 tablets by mouth every 4 (four) hours as needed for pain.  25 tablet  1   No current facility-administered medications for this visit.   Facility-Administered Medications Ordered in Other Visits  Medication Dose Route Frequency Provider Last Rate Last Dose  . sodium chloride 0.9 % injection 10 mL  10 mL Intracatheter PRN Heath Lark, MD   10 mL at 03/15/13 1808    REVIEW OF SYSTEMS:   Constitutional: Denies fevers, chills or abnormal weight loss Eyes: Denies blurriness of  vision Ears, nose, mouth, throat, and face: Denies mucositis or sore throat Respiratory: Denies cough, dyspnea or wheezes Cardiovascular: Denies palpitation, chest discomfort or lower extremity swelling Gastrointestinal:  Denies nausea, heartburn or change in bowel habits Skin: Denies abnormal skin rashes Lymphatics: Denies new lymphadenopathy or easy bruising Neurological:Denies numbness, tingling or new weaknesses Behavioral/Psych: Mood is stable, no new changes  All other systems were reviewed with the patient and are negative.  PHYSICAL EXAMINATION: ECOG PERFORMANCE STATUS: 1 - Symptomatic but completely ambulatory  Filed Vitals:   07/19/13 0935  BP: 122/70  Pulse: 79  Temp: 96.9 F (36.1 C)  Resp: 18   Filed Weights   07/19/13 0935  Weight: 124 lb (56.246 kg)    GENERAL:alert, no distress and comfortable SKIN: skin color, texture, turgor are normal, no rashes or significant lesions Musculoskeletal:no cyanosis of digits and no clubbing  NEURO: alert & oriented x 3 with dysphasia, no focal motor/sensory deficits  LABORATORY DATA:  I have reviewed the data as listed    Component Value Date/Time   NA 134* 07/12/2013 0845   NA 136 08/19/2012 0625   K 4.2 07/12/2013 0845   K 4.3 08/19/2012 0625   CL 98 11/15/2012 0941   CL 100 08/19/2012 0625   CO2 26 07/12/2013 0845   CO2 28 08/19/2012 0625   GLUCOSE 90 07/12/2013 0845   GLUCOSE 90 11/15/2012 0941   GLUCOSE 109* 08/19/2012 0625   BUN 14.1 07/12/2013 0845   BUN 9 08/19/2012 0625   CREATININE 1.0 07/12/2013 0845   CREATININE 0.69 08/19/2012 0625   CALCIUM 9.5 07/12/2013 0845   CALCIUM 8.3* 08/20/2012 0542   PROT 7.7 07/12/2013 0845   PROT 6.9 08/19/2012 0625   ALBUMIN 3.8 07/12/2013 0845   ALBUMIN 2.4* 08/19/2012 0625   AST 19 07/12/2013 0845   AST 25 08/19/2012 0625   ALT 12 07/12/2013 0845   ALT 20 08/19/2012 0625   ALKPHOS 61 07/12/2013 0845   ALKPHOS 57 08/19/2012 0625   BILITOT 0.56 07/12/2013 0845   BILITOT 0.6 08/19/2012 0625   GFRNONAA >90  08/19/2012 0625   GFRAA >90 08/19/2012 0625    No results found for this basename: SPEP, UPEP,  kappa and lambda light chains    Lab Results  Component Value Date   WBC 3.8* 07/19/2013   NEUTROABS 2.5 07/19/2013   HGB 11.2* 07/19/2013   HCT 33.1* 07/19/2013   MCV 98.5* 07/19/2013   PLT 161 07/19/2013      Chemistry      Component Value Date/Time   NA 134* 07/12/2013 0845   NA 136 08/19/2012 0625   K 4.2 07/12/2013 0845   K 4.3 08/19/2012 0625   CL 98 11/15/2012 0941   CL 100 08/19/2012 0625   CO2 26 07/12/2013 0845   CO2 28 08/19/2012 0625   BUN 14.1 07/12/2013 0845   BUN 9 08/19/2012 0625   CREATININE 1.0 07/12/2013 0845   CREATININE 0.69 08/19/2012 0625      Component Value Date/Time   CALCIUM 9.5 07/12/2013 0845   CALCIUM 8.3* 08/20/2012 0542   ALKPHOS 61 07/12/2013 0845   ALKPHOS 57 08/19/2012 0625   AST 19 07/12/2013 0845  AST 25 08/19/2012 0625   ALT 12 07/12/2013 0845   ALT 20 08/19/2012 0625   BILITOT 0.56 07/12/2013 0845   BILITOT 0.6 08/19/2012 1062       RADIOGRAPHIC STUDIES: I reviewed the PET/CT scan with him and his daughter we show complete resolution of the metastatic disease in the bone I have personally reviewed the radiological images as listed and agreed with the findings in the report.  ASSESSMENT & PLAN:  #1 metastatic laryngeal cancer I recommend stop his treatment as his last PET scan is completely normal. We discussed about the possibility of removing his port but the patient is willing to come in every 6 weeks to have his port flushed. I will see him back in 3 months with history, physical examination, blood work, CT scan of the chest and the neck #2 leukopenia This is likely due to recent treatment. The patient denies recent history of fevers, cough, chills, diarrhea or dysuria. He is asymptomatic from the leukopenia. I will observe for now.  I will continue the chemotherapy at current dose without dosage adjustment. For future cycles, I will continue  his treatment to 2  weeks on 1 week off. #3 significant hypothyroidism I have increased his dose recently. I will recheck his thyroid function tests in 3 months. #4 anemia This is likely due to recent treatment. The patient denies recent history of bleeding such as epistaxis, hematuria or hematochezia. He is asymptomatic from the anemia. I will observe for now.  he does not require transfusion now. I will continue the chemotherapy at current dose without dosage adjustment.  If the anemia gets progressive worse in the future, I might have to delay his treatment or adjust the chemotherapy dose. #5 bony metastasis I will hold off giving him IV bisphosphonates to reduce the risk of osteonecrosis of the jaw due to recent radiation treatment. The patient is asymptomatic at this point in time. #6 hyponatremia This is likely due to reduced salt intake and hypothyroidism. It is stable. We will observe.  #7 peripheral neuropathy This is related to side effects of treatment. It is only mild grade 1. We will observe.    Orders Placed This Encounter  Procedures  . CT Chest W Contrast    Standing Status: Future     Number of Occurrences:      Standing Expiration Date: 09/18/2014    Order Specific Question:  Reason for Exam (SYMPTOM  OR DIAGNOSIS REQUIRED)    Answer:  staging laryngeal ca, r/o recurrence    Order Specific Question:  Preferred imaging location?    Answer:  [facility]  . CT Soft Tissue Neck W Contrast    Standing Status: Future     Number of Occurrences:      Standing Expiration Date: 10/19/2014    Order Specific Question:  Reason for Exam (SYMPTOM  OR DIAGNOSIS REQUIRED)    Answer:  staging laryngeal ca, r/o recurrence    Order Specific Question:  Preferred imaging location?    Answer:  [facility]  . Comprehensive metabolic panel    Standing Status: Future     Number of Occurrences:      Standing Expiration Date: 07/19/2014  . CBC with Differential    Standing Status: Future      Number of Occurrences:      Standing Expiration Date: 07/19/2014  . T4, free    Standing Status: Future     Number of Occurrences:      Standing  Expiration Date: 07/19/2014  . TSH    Standing Status: Future     Number of Occurrences:      Standing Expiration Date: 07/19/2014   All questions were answered. The patient knows to call the clinic with any problems, questions or concerns. No barriers to learning was detected. I spent 40 minutes counseling the patient face to face. The total time spent in the appointment was 60 minutes and more than 50% was on counseling and review of test results     [facility], [place], MD 07/19/2013 9:54 AM

## 2013-07-19 NOTE — Progress Notes (Signed)
07/19/2013  Patient:            Cody [name] Date of Birth:  Sep 12, 1945 MRN:                [id number]  BP 119/69  Pulse 81  Temp(Src) 98.1 F (36.7 C) (Oral)   [name] presents for evaluation of upper and lower complete dentures.  SUBJECTIVE: Patient is complaining of slight denture irritation to the lower right anterior area. OBJECTIVE: There is no evidence of denture irritation or erythema. Xerostomia is noted. There is no evidence of the previously removed bony spicule in the mandibular anterior area.  Procedure: Pressure indicating paste applied to dentures. Adjustments made as needed. [place] Occlusion evaluated and no adjustments were needed for Centric Relation and protrusive strokes. Patient still with tendency to protrude to end to end position. Patient was instructed again on finding maximum intercuspation position. Patient accepts results.  Patient to keep dentures out if sore spots develop. Use salt water rinses as needed to aid healing. Return to clinic as scheduled for denture adjustment.  Call if problems arise before then. Patient dismissed in stable condition.  Lenn Cal, DDS

## 2013-07-19 NOTE — Telephone Encounter (Signed)
gv pt appt schedule for march thru may. central will call re ct scan - pt/relative aware.

## 2013-07-19 NOTE — Patient Instructions (Signed)
Patient to keep dentures out if sore spots arise. Use salt water rinses as needed to aid healing. Return to clinic as scheduled for denture adjustment appointment. Call if problems arise before then. Dr. Enrique Sack

## 2013-07-20 ENCOUNTER — Ambulatory Visit: Payer: Self-pay

## 2013-07-27 ENCOUNTER — Ambulatory Visit: Payer: Self-pay

## 2013-08-12 ENCOUNTER — Ambulatory Visit (HOSPITAL_BASED_OUTPATIENT_CLINIC_OR_DEPARTMENT_OTHER): Payer: Medicare Other

## 2013-08-12 VITALS — BP 138/68 | HR 83 | Temp 97.8°F

## 2013-08-12 DIAGNOSIS — C7951 Secondary malignant neoplasm of bone: Secondary | ICD-10-CM

## 2013-08-12 DIAGNOSIS — C7952 Secondary malignant neoplasm of bone marrow: Secondary | ICD-10-CM

## 2013-08-12 DIAGNOSIS — C321 Malignant neoplasm of supraglottis: Secondary | ICD-10-CM

## 2013-08-12 DIAGNOSIS — Z95828 Presence of other vascular implants and grafts: Secondary | ICD-10-CM

## 2013-08-12 DIAGNOSIS — Z452 Encounter for adjustment and management of vascular access device: Secondary | ICD-10-CM

## 2013-08-12 MED ORDER — SODIUM CHLORIDE 0.9 % IJ SOLN
10.0000 mL | INTRAMUSCULAR | Status: DC | PRN
  Administered 2013-08-12: 10 mL via INTRAVENOUS
  Filled 2013-08-12: qty 10

## 2013-08-12 MED ORDER — HEPARIN SOD (PORK) LOCK FLUSH 100 UNIT/ML IV SOLN
500.0000 [IU] | Freq: Once | INTRAVENOUS | Status: AC
  Administered 2013-08-12: 500 [IU] via INTRAVENOUS
  Filled 2013-08-12: qty 5

## 2013-08-20 ENCOUNTER — Other Ambulatory Visit: Payer: Self-pay | Admitting: Hematology and Oncology

## 2013-08-23 ENCOUNTER — Telehealth: Payer: Self-pay | Admitting: *Deleted

## 2013-08-23 ENCOUNTER — Other Ambulatory Visit: Payer: Self-pay | Admitting: Hematology and Oncology

## 2013-08-23 DIAGNOSIS — C321 Malignant neoplasm of supraglottis: Secondary | ICD-10-CM

## 2013-08-23 DIAGNOSIS — E039 Hypothyroidism, unspecified: Secondary | ICD-10-CM

## 2013-08-23 MED ORDER — LEVOTHYROXINE SODIUM 50 MCG PO TABS: 50.0000 ug | ORAL_TABLET | Freq: Every day | ORAL | Status: DC

## 2013-08-23 NOTE — Telephone Encounter (Signed)
Dau states pt needs refill on Synthroid.  Took his last pill yesterday. Dr. Alvy Bimler escribed refill this morning.  Notified Daughter and she verbalized understanding.

## 2013-08-26 ENCOUNTER — Encounter: Payer: Self-pay | Admitting: Radiation Oncology

## 2013-08-26 ENCOUNTER — Ambulatory Visit
Admission: RE | Admit: 2013-08-26 | Discharge: 2013-08-26 | Disposition: A | Discharge status: Home / Self Care | Payer: Medicare Other | Source: Ambulatory Visit | Attending: Radiation Oncology | Admitting: Radiation Oncology

## 2013-08-26 ENCOUNTER — Encounter: Payer: Self-pay | Admitting: *Deleted

## 2013-08-26 VITALS — BP 112/74 | HR 77 | Temp 98.9°F | Wt 126.6 lb

## 2013-08-26 DIAGNOSIS — C321 Malignant neoplasm of supraglottis: Secondary | ICD-10-CM

## 2013-08-26 NOTE — Progress Notes (Signed)
Pt is here for follow-up visit with Dr. Isidore Moos.  He reports he has not had throat pain, no skin irritation; some hyperpigmentation on neck; longer using Biafine.  Reports increasing mucous plugs which he is addressing with Dr. Constance Holster; presently arranging follow-up appt with him.  Eating solid food, maintaining hydration; reports appetite at baseline.  Bowels regular. Stoma appears patent and without irritation.

## 2013-08-26 NOTE — Progress Notes (Signed)
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 Reports increasing mucous plugs which he is addressing with Dr. Constance Holster; presently arranging follow-up appt with him. Eating solid food, maintaining hydration; reports appetite at baseline. Bowels regular. Complete response to chemotherapy per bone lesions on Feb 2015 PET scan. No longer receiving systemic therapy via Dr Alvy Bimler.  ALLERGIES:  has No Known Allergies.  Meds: Current Outpatient Prescriptions  Medication Sig Dispense Refill  . acetaminophen (TYLENOL) 325 MG tablet Take 650 mg by mouth daily as needed for pain.      Marland Kitchen levothyroxine (SYNTHROID, LEVOTHROID) 50 MCG tablet Take 1 tablet (50 mcg total) by mouth daily before breakfast.  30 tablet  3  . Multiple Vitamin (MULTIVITAMIN) tablet Take 1 tablet by mouth daily.      Marland Kitchen HYDROcodone-acetaminophen (NORCO) 5-325 MG per tablet Take 1-2 tablets by mouth every 4 (four) hours as needed for pain.  25 tablet  1  . ondansetron (ZOFRAN) 8 MG tablet Take 1 tablet (8 mg total) by mouth every 8 (eight) hours as needed for nausea.  30 tablet  1  . polyethylene glycol powder (GLYCOLAX/MIRALAX) powder       . promethazine (PHENERGAN) 25 MG suppository Place 1 suppository (25 mg total) rectally every 6 (six) hours as needed for nausea.  12 each  0  . senna (SENOKOT) 8.6 MG tablet  Take 2 tablets (17.2 mg total) by mouth 2 (two) times daily.  60 tablet  3   No current facility-administered medications for this encounter.    Physical Findings: The patient is in no acute distress. Patient is alert and oriented.  weight is 126 lb 9.6 oz (57.425 kg). His temperature is 98.9 F (37.2 C). His blood pressure is 112/74 and his pulse is 77. His oxygen saturation is 100%. .  Oropharynx - moist, no lesions, +dentures. Neck without cervical or SCV adenopathy. Stoma clean, intact.  Lab Findings: Lab Results  Component Value Date   WBC 3.8* 07/19/2013   HGB 11.2* 07/19/2013   HCT 33.1* 07/19/2013   MCV 98.5* 07/19/2013   PLT 161 07/19/2013    Lab Results  Component Value Date   TSH 13.282* 06/28/2013    Radiographic Findings: PET 07-15-13 IMPRESSION: 1. No evidence of local laryngeal carcinoma recurrence or nodal metastasis. 2. No evidence distant metastasis. 3. No evidence of active skeletal metastasis. 4. Persistent nodular branching pattern in the right upper lobe with mild metabolic activity is likely post infectious or inflammatory.    Impression/Plan:    1) Head and Neck Cancer Status: complete response to chemotherapy for bone metastases  2) Nutritional Status: no active issues  3) Risk Factors: The patient has been educated about risk factors including alcohol and tobacco abuse; they understand that avoidance of alcohol and tobacco is important to prevent recurrences as well as other cancers  4) Swallowing: no active issues  5) Dental: dentures  6)  Energy: TSH /synthroid managed by Dr Alvy Bimler.  7) Social: No active social issues to address at this time  8) Other: Dr Constance Holster appointment pending for mucous plugs  9) Follow-up in 4 months. The patient was encouraged to call with any issues or questions before then.  I spent 20 minutes minutes face to face with the patient and more than 50% of that time was spent in counseling and/or coordination of  care. _____________________________________   Eppie Gibson, MD

## 2013-09-03 NOTE — Progress Notes (Signed)
To maintain care continuity, met with patient and his dtr during follow-up appt with Dr. Isidore Moos.  They did not express any navigation needs; I encouraged them to call me should that change.  They verbalized understanding.  Continuing to navigate as L3 (treatments completed) patient.  Gayleen Orem, RN, BSN, [facility] Head & Neck Oncology Navigator [phone]

## 2013-10-14 ENCOUNTER — Ambulatory Visit (HOSPITAL_COMMUNITY)
Admission: RE | Admit: 2013-10-14 | Discharge: 2013-10-14 | Disposition: A | Discharge status: Home / Self Care | Payer: Medicare Other | Source: Ambulatory Visit | Attending: Hematology and Oncology | Admitting: Hematology and Oncology

## 2013-10-14 ENCOUNTER — Ambulatory Visit: Payer: Medicare Other

## 2013-10-14 ENCOUNTER — Other Ambulatory Visit (HOSPITAL_BASED_OUTPATIENT_CLINIC_OR_DEPARTMENT_OTHER): Payer: Medicare Other

## 2013-10-14 VITALS — BP 132/76 | HR 88

## 2013-10-14 DIAGNOSIS — C321 Malignant neoplasm of supraglottis: Secondary | ICD-10-CM | POA: Insufficient documentation

## 2013-10-14 DIAGNOSIS — C7951 Secondary malignant neoplasm of bone: Secondary | ICD-10-CM

## 2013-10-14 DIAGNOSIS — D649 Anemia, unspecified: Secondary | ICD-10-CM

## 2013-10-14 DIAGNOSIS — E039 Hypothyroidism, unspecified: Secondary | ICD-10-CM

## 2013-10-14 DIAGNOSIS — C7952 Secondary malignant neoplasm of bone marrow: Secondary | ICD-10-CM

## 2013-10-14 DIAGNOSIS — Z95828 Presence of other vascular implants and grafts: Secondary | ICD-10-CM

## 2013-10-14 LAB — CBC WITH DIFFERENTIAL/PLATELET
BASO%: 0.3 % (ref 0.0–2.0)
Basophils Absolute: 0 10*3/uL (ref 0.0–0.1)
EOS ABS: 0.1 10*3/uL (ref 0.0–0.5)
EOS%: 3.1 % (ref 0.0–7.0)
HCT: 34.4 % — ABNORMAL LOW (ref 38.4–49.9)
HGB: 11.6 g/dL — ABNORMAL LOW (ref 13.0–17.1)
LYMPH%: 22.9 % (ref 14.0–49.0)
MCH: 31.8 pg (ref 27.2–33.4)
MCHC: 33.8 g/dL (ref 32.0–36.0)
MCV: 94.1 fL (ref 79.3–98.0)
MONO#: 0.5 10*3/uL (ref 0.1–0.9)
MONO%: 10.3 % (ref 0.0–14.0)
NEUT%: 63.4 % (ref 39.0–75.0)
NEUTROS ABS: 2.8 10*3/uL (ref 1.5–6.5)
Platelets: 160 10*3/uL (ref 140–400)
RBC: 3.66 10*6/uL — AB (ref 4.20–5.82)
RDW: 15.2 % — AB (ref 11.0–14.6)
WBC: 4.4 10*3/uL (ref 4.0–10.3)
lymph#: 1 10*3/uL (ref 0.9–3.3)

## 2013-10-14 LAB — COMPREHENSIVE METABOLIC PANEL (CC13)
ALBUMIN: 3.6 g/dL (ref 3.5–5.0)
ALT: 9 U/L (ref 0–55)
AST: 20 U/L (ref 5–34)
Alkaline Phosphatase: 66 U/L (ref 40–150)
Anion Gap: 10 mEq/L (ref 3–11)
BUN: 17.5 mg/dL (ref 7.0–26.0)
CALCIUM: 9.5 mg/dL (ref 8.4–10.4)
CHLORIDE: 102 meq/L (ref 98–109)
CO2: 24 mEq/L (ref 22–29)
Creatinine: 1.2 mg/dL (ref 0.7–1.3)
GLUCOSE: 91 mg/dL (ref 70–140)
Potassium: 4 mEq/L (ref 3.5–5.1)
Sodium: 135 mEq/L — ABNORMAL LOW (ref 136–145)
Total Bilirubin: 0.52 mg/dL (ref 0.20–1.20)
Total Protein: 7.8 g/dL (ref 6.4–8.3)

## 2013-10-14 LAB — T4, FREE: Free T4: 0.92 ng/dL (ref 0.80–1.80)

## 2013-10-14 LAB — TSH CHCC: TSH: 5.827 m[IU]/L — AB (ref 0.320–4.118)

## 2013-10-14 MED ORDER — SODIUM CHLORIDE 0.9 % IJ SOLN
10.0000 mL | INTRAMUSCULAR | Status: DC | PRN
  Administered 2013-10-14: 10 mL via INTRAVENOUS
  Filled 2013-10-14: qty 10

## 2013-10-14 MED ORDER — HEPARIN SOD (PORK) LOCK FLUSH 100 UNIT/ML IV SOLN
500.0000 [IU] | Freq: Once | INTRAVENOUS | Status: AC
  Administered 2013-10-14: 500 [IU] via INTRAVENOUS
  Filled 2013-10-14: qty 5

## 2013-10-14 MED ORDER — IOHEXOL 300 MG/ML  SOLN
100.0000 mL | Freq: Once | INTRAMUSCULAR | Status: AC | PRN
  Administered 2013-10-14: 100 mL via INTRAVENOUS

## 2013-10-17 ENCOUNTER — Encounter (HOSPITAL_COMMUNITY): Payer: Self-pay | Admitting: Dentistry

## 2013-10-21 ENCOUNTER — Ambulatory Visit: Payer: Self-pay | Admitting: Hematology and Oncology

## 2013-12-16 ENCOUNTER — Ambulatory Visit: Admission: RE | Admit: 2013-12-16 | Payer: Medicare Other | Source: Ambulatory Visit | Admitting: Radiation Oncology

## 2013-12-21 ENCOUNTER — Encounter: Payer: Self-pay | Admitting: *Deleted

## 2013-12-29 ENCOUNTER — Telehealth: Payer: Self-pay | Admitting: Hematology and Oncology

## 2013-12-29 NOTE — Telephone Encounter (Signed)
returned pt call and r/s missed appts...done.Marland Kitchen..Mrs. Cody [name] aware of appts

## 2013-12-30 ENCOUNTER — Ambulatory Visit (HOSPITAL_BASED_OUTPATIENT_CLINIC_OR_DEPARTMENT_OTHER): Payer: Medicare Other

## 2013-12-30 ENCOUNTER — Other Ambulatory Visit: Payer: Self-pay | Admitting: Hematology and Oncology

## 2013-12-30 VITALS — BP 118/67 | HR 79 | Temp 97.5°F

## 2013-12-30 DIAGNOSIS — Z452 Encounter for adjustment and management of vascular access device: Secondary | ICD-10-CM

## 2013-12-30 DIAGNOSIS — C321 Malignant neoplasm of supraglottis: Secondary | ICD-10-CM

## 2013-12-30 DIAGNOSIS — C7951 Secondary malignant neoplasm of bone: Secondary | ICD-10-CM

## 2013-12-30 DIAGNOSIS — Z95828 Presence of other vascular implants and grafts: Secondary | ICD-10-CM

## 2013-12-30 DIAGNOSIS — C7952 Secondary malignant neoplasm of bone marrow: Secondary | ICD-10-CM

## 2013-12-30 MED ORDER — SODIUM CHLORIDE 0.9 % IJ SOLN
10.0000 mL | INTRAMUSCULAR | Status: DC | PRN
  Administered 2013-12-30: 10 mL via INTRAVENOUS
  Filled 2013-12-30: qty 10

## 2013-12-30 MED ORDER — HEPARIN SOD (PORK) LOCK FLUSH 100 UNIT/ML IV SOLN
500.0000 [IU] | Freq: Once | INTRAVENOUS | Status: AC
  Administered 2013-12-30: 500 [IU] via INTRAVENOUS
  Filled 2013-12-30: qty 5

## 2013-12-30 NOTE — Patient Instructions (Signed)

## 2014-01-09 ENCOUNTER — Ambulatory Visit: Payer: Self-pay | Admitting: Hematology and Oncology

## 2014-01-09 ENCOUNTER — Telehealth: Payer: Self-pay | Admitting: Hematology and Oncology

## 2014-01-09 NOTE — Telephone Encounter (Signed)
pt daughter called tor /s missed appt...done....aware of new d.t

## 2014-01-24 ENCOUNTER — Encounter: Payer: Self-pay | Admitting: Hematology and Oncology

## 2014-01-24 ENCOUNTER — Telehealth: Payer: Self-pay | Admitting: Hematology and Oncology

## 2014-01-24 ENCOUNTER — Ambulatory Visit (HOSPITAL_BASED_OUTPATIENT_CLINIC_OR_DEPARTMENT_OTHER): Payer: Medicare Other | Admitting: Hematology and Oncology

## 2014-01-24 VITALS — BP 115/62 | HR 92 | Temp 99.2°F | Resp 18 | Ht 69.0 in | Wt 128.3 lb

## 2014-01-24 DIAGNOSIS — G629 Polyneuropathy, unspecified: Secondary | ICD-10-CM

## 2014-01-24 DIAGNOSIS — E039 Hypothyroidism, unspecified: Secondary | ICD-10-CM

## 2014-01-24 DIAGNOSIS — C7952 Secondary malignant neoplasm of bone marrow: Secondary | ICD-10-CM

## 2014-01-24 DIAGNOSIS — C321 Malignant neoplasm of supraglottis: Secondary | ICD-10-CM

## 2014-01-24 DIAGNOSIS — C7951 Secondary malignant neoplasm of bone: Secondary | ICD-10-CM

## 2014-01-24 DIAGNOSIS — G622 Polyneuropathy due to other toxic agents: Secondary | ICD-10-CM

## 2014-01-24 MED ORDER — LEVOTHYROXINE SODIUM 50 MCG PO TABS: 50.0000 ug | ORAL_TABLET | Freq: Every day | ORAL | Status: DC

## 2014-01-24 NOTE — Assessment & Plan Note (Signed)
Clinically, he has no signs of disease recurrence or progression. I recommend repeat history, physical examination, blood work and imaging study in November. The patient and his daughter are educated to watch out for signs and symptoms of disease recurrence.

## 2014-01-24 NOTE — Telephone Encounter (Signed)
gv and printed appt scehd and avs for pt for Sept thru [facility]

## 2014-01-24 NOTE — Progress Notes (Signed)
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     07/09/2012 Initial Diagnosis Carcinoma of supraglottis from biopsy, HPV negative   08/13/2012 Surgery He underwent laryngectomy and bilateral LN dissection. There were bilateral LN involvement and 10/25 LN were positive with extracapsular extension staging T3N2Mx   09/20/2012 - 10/29/2012 Chemotherapy he received concurrent chemo/Rt with weekly cisplatin   02/23/2013 Imaging Staging PET/Ct scan showed focal hypermetabolic bone lesions from metastatic cancer   03/09/2013 Procedure Placement of infusaport under IR   03/15/2013 - 07/19/2013 Chemotherapy Start cycle 1 palliative chemotherapy with Weekly Carboplatin & Paclitacel with mild dosage adjustment due to elevated creatinine. Chemotherapy was subsequently discontinued as [name]   05/03/2013 Imaging Repeat PEt/CT showed no evidence of local laryngeal squamous cell carcinoma in the neck & interval resolution of metabolic activity associated skeletal metastases.    05/10/2013 Adverse Reaction Dose of Paclitxel was reduced by 20% due to worsening neuropathy   07/15/2013 Imaging Repeat PET CT scan show resolution of his disease in the bones. There is mild  inflammatory/infectious process in his lungs but the patient is not symptomatic   10/14/2013 Imaging CT scan show no evidence of disease progression.    INTERVAL HISTORY: Please see below for problem oriented charting. The patient missed several appointments recently. He feels fine. Denies new lymphadenopathy. No new swallowing difficulties. Denies recent bone pain. Denies recent infection.  REVIEW OF SYSTEMS:   Constitutional: Denies fevers, chills or abnormal weight loss Eyes: Denies blurriness of vision Ears, nose, mouth, throat, and face: Denies mucositis or sore throat Respiratory: Denies cough, dyspnea or wheezes Cardiovascular: Denies palpitation, chest discomfort or lower extremity swelling Gastrointestinal:  Denies nausea, heartburn or change in bowel habits Skin: Denies abnormal skin rashes Lymphatics: Denies new lymphadenopathy or easy bruising Neurological:Denies numbness, tingling or new weaknesses Behavioral/Psych: Mood is stable, no new changes  All other systems were reviewed with the patient and are negative.  I have reviewed the past medical history, past surgical history, social history and family history with the patient and they are unchanged from previous note.  ALLERGIES:  has No Known Allergies.  MEDICATIONS:  Current Outpatient Prescriptions  Medication Sig Dispense Refill  . acetaminophen (TYLENOL) 325 MG tablet Take 650 mg by mouth daily as needed for pain.      Marland Kitchen levothyroxine (SYNTHROID, LEVOTHROID) 50 MCG tablet Take 1 tablet (50 mcg total) by mouth daily before breakfast.  30 tablet  6  . Multiple Vitamin (MULTIVITAMIN) tablet Take 1 tablet by mouth daily.       No current facility-administered medications for this visit.    PHYSICAL EXAMINATION: ECOG PERFORMANCE STATUS: 0 - Asymptomatic  Filed Vitals:   01/24/14 1354  BP: 115/62  Pulse: 92  Temp: 99.2 F (37.3 C)  Resp: 18  Filed Weights   01/24/14 1354  Weight: 128 lb 4.8 oz (58.196 kg)     GENERAL:alert, no distress and comfortable. He looks thin but not cachectic SKIN: skin color, texture, turgor are normal, no rashes or significant lesions EYES: normal, Conjunctiva are pink and non-injected, sclera clear OROPHARYNX:no exudate, no erythema and lips, buccal mucosa, and tongue normal  NECK: Tracheostomy site looks okay without signs of infection. His neck is thick and fibrosis from prior surgery and radiation.  LYMPH:  no palpable lymphadenopathy in the cervical, axillary or inguinal LUNGS: clear to auscultation and percussion with normal breathing effort HEART: regular rate & rhythm and no murmurs and no lower extremity edema ABDOMEN:abdomen soft, non-tender and normal bowel sounds Musculoskeletal:no cyanosis of digits and no clubbing  NEURO: alert & oriented x 3, no focal motor/sensory deficits  LABORATORY DATA:  I have reviewed the data as listed    Component Value Date/Time   NA 135* 10/14/2013 0812   NA 136 08/19/2012 0625   K 4.0 10/14/2013 0812   K 4.3 08/19/2012 0625   CL 98 11/15/2012 0941   CL 100 08/19/2012 0625   CO2 24 10/14/2013 0812   CO2 28 08/19/2012 0625   GLUCOSE 91 10/14/2013 0812   GLUCOSE 90 11/15/2012 0941   GLUCOSE 109* 08/19/2012 0625   BUN 17.5 10/14/2013 0812   BUN 9 08/19/2012 0625   CREATININE 1.2 10/14/2013 0812   CREATININE 0.69 08/19/2012 0625   CALCIUM 9.5 10/14/2013 0812   CALCIUM 8.3* 08/20/2012 0542   PROT 7.8 10/14/2013 0812   PROT 6.9 08/19/2012 0625   ALBUMIN 3.6 10/14/2013 0812   ALBUMIN 2.4* 08/19/2012 0625   AST 20 10/14/2013 0812   AST 25 08/19/2012 0625   ALT 9 10/14/2013 0812   ALT 20 08/19/2012 0625   ALKPHOS 66 10/14/2013 0812   ALKPHOS 57 08/19/2012 0625   BILITOT 0.52 10/14/2013 0812   BILITOT 0.6 08/19/2012 0625   GFRNONAA >90 08/19/2012 0625   GFRAA >90 08/19/2012 0625    No results found for this basename: SPEP, UPEP,  kappa and lambda light chains    Lab Results  Component Value Date   WBC 4.4 10/14/2013   NEUTROABS 2.8 10/14/2013   HGB  11.6* 10/14/2013   HCT 34.4* 10/14/2013   MCV 94.1 10/14/2013   PLT 160 10/14/2013      Chemistry      Component Value Date/Time   NA 135* 10/14/2013 0812   NA 136 08/19/2012 0625   K 4.0 10/14/2013 0812   K 4.3 08/19/2012 0625   CL 98 11/15/2012 0941   CL 100 08/19/2012 0625   CO2 24 10/14/2013 0812   CO2 28 08/19/2012 0625   BUN 17.5 10/14/2013 0812   BUN 9 08/19/2012 0625   CREATININE 1.2 10/14/2013 0812   CREATININE 0.69 08/19/2012 0625      Component Value Date/Time   CALCIUM 9.5 10/14/2013 0812   CALCIUM 8.3* 08/20/2012 0542   ALKPHOS 66 10/14/2013 0812   ALKPHOS 57 08/19/2012 0625   AST 20 10/14/2013 0812   AST 25 08/19/2012 0625   ALT 9 10/14/2013 0812   ALT 20 08/19/2012 0625   BILITOT 0.52 10/14/2013 0812   BILITOT 0.6 08/19/2012 0625      ASSESSMENT & PLAN:  Carcinoma of supraglottis Clinically, he has no signs of disease recurrence or progression. I recommend repeat history, physical examination, blood work and imaging study in November. The patient and his daughter are educated to watch  out for signs and symptoms of disease recurrence.  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 Recommend observation only.  Hypothyroidism The patient requires permanent replacement therapy. He is compliant. I would recheck thyroid function tests with his next visit.   Orders Placed This Encounter  Procedures  . CT Chest W Contrast    Standing Status: Future     Number of Occurrences:      Standing Expiration Date: 03/26/2015    Order Specific Question:  Reason for Exam (SYMPTOM  OR DIAGNOSIS REQUIRED)    Answer:  staging supraglotic ca    Order Specific Question:  Preferred imaging location?    Answer:  [facility]  . CT Soft Tissue Neck W Contrast    Standing Status: Future     Number of Occurrences:      Standing Expiration Date: 04/26/2015    Order Specific Question:  Reason for Exam (SYMPTOM  OR DIAGNOSIS REQUIRED)    Answer:  staging supraglotic ca    Order Specific  Question:  Preferred imaging location?    Answer:  [facility]  . CBC with Differential    Standing Status: Future     Number of Occurrences:      Standing Expiration Date: 02/28/2015  . Comprehensive metabolic panel    Standing Status: Future     Number of Occurrences:      Standing Expiration Date: 02/28/2015  . TSH    Standing Status: Future     Number of Occurrences:      Standing Expiration Date: 02/28/2015  . T4, free    Standing Status: Future     Number of Occurrences:      Standing Expiration Date: 02/28/2015   All questions were answered. The patient knows to call the clinic with any problems, questions or concerns. No barriers to learning was detected. I spent 25 minutes counseling the patient face to face. The total time spent in the appointment was 30 minutes and more than 50% was on counseling and review of test results     [facility], [place], MD 01/24/2014 7:51 PM

## 2014-01-24 NOTE — Assessment & Plan Note (Signed)
This is stable. Recommend observation only. 

## 2014-01-24 NOTE — Assessment & Plan Note (Signed)
The patient requires permanent replacement therapy. He is compliant. I would recheck thyroid function tests with his next visit.

## 2014-02-10 ENCOUNTER — Ambulatory Visit (HOSPITAL_BASED_OUTPATIENT_CLINIC_OR_DEPARTMENT_OTHER): Payer: Medicare Other

## 2014-02-10 DIAGNOSIS — C321 Malignant neoplasm of supraglottis: Secondary | ICD-10-CM

## 2014-02-10 DIAGNOSIS — Z452 Encounter for adjustment and management of vascular access device: Secondary | ICD-10-CM

## 2014-02-10 DIAGNOSIS — C7952 Secondary malignant neoplasm of bone marrow: Secondary | ICD-10-CM

## 2014-02-10 DIAGNOSIS — Z95828 Presence of other vascular implants and grafts: Secondary | ICD-10-CM

## 2014-02-10 DIAGNOSIS — C7951 Secondary malignant neoplasm of bone: Secondary | ICD-10-CM

## 2014-02-10 MED ORDER — HEPARIN SOD (PORK) LOCK FLUSH 100 UNIT/ML IV SOLN
500.0000 [IU] | Freq: Once | INTRAVENOUS | Status: AC
  Administered 2014-02-10: 500 [IU] via INTRAVENOUS
  Filled 2014-02-10: qty 5

## 2014-02-10 MED ORDER — SODIUM CHLORIDE 0.9 % IJ SOLN
10.0000 mL | INTRAMUSCULAR | Status: DC | PRN
  Administered 2014-02-10: 10 mL via INTRAVENOUS
  Filled 2014-02-10: qty 10

## 2014-02-10 NOTE — Patient Instructions (Signed)

## 2014-03-24 ENCOUNTER — Ambulatory Visit (HOSPITAL_BASED_OUTPATIENT_CLINIC_OR_DEPARTMENT_OTHER): Payer: Medicare Other

## 2014-03-24 DIAGNOSIS — Z95828 Presence of other vascular implants and grafts: Secondary | ICD-10-CM

## 2014-03-24 DIAGNOSIS — C321 Malignant neoplasm of supraglottis: Secondary | ICD-10-CM

## 2014-03-24 DIAGNOSIS — Z452 Encounter for adjustment and management of vascular access device: Secondary | ICD-10-CM

## 2014-03-24 MED ORDER — HEPARIN SOD (PORK) LOCK FLUSH 100 UNIT/ML IV SOLN
500.0000 [IU] | Freq: Once | INTRAVENOUS | Status: AC
Start: 2014-03-24 — End: 2014-03-24
  Administered 2014-03-24: 500 [IU] via INTRAVENOUS
  Filled 2014-03-24: qty 5

## 2014-03-24 MED ORDER — SODIUM CHLORIDE 0.9 % IJ SOLN
10.0000 mL | INTRAMUSCULAR | Status: DC | PRN
  Administered 2014-03-24: 10 mL via INTRAVENOUS
  Filled 2014-03-24: qty 10

## 2014-03-24 NOTE — Patient Instructions (Signed)

## 2014-04-14 ENCOUNTER — Other Ambulatory Visit (HOSPITAL_BASED_OUTPATIENT_CLINIC_OR_DEPARTMENT_OTHER): Payer: Medicare Other

## 2014-04-14 ENCOUNTER — Ambulatory Visit (HOSPITAL_BASED_OUTPATIENT_CLINIC_OR_DEPARTMENT_OTHER): Payer: Medicare Other

## 2014-04-14 ENCOUNTER — Ambulatory Visit (HOSPITAL_COMMUNITY)
Admission: RE | Admit: 2014-04-14 | Discharge: 2014-04-14 | Disposition: A | Discharge status: Home / Self Care | Payer: Medicare Other | Source: Ambulatory Visit | Attending: Hematology and Oncology | Admitting: Hematology and Oncology

## 2014-04-14 ENCOUNTER — Encounter (HOSPITAL_COMMUNITY): Payer: Self-pay

## 2014-04-14 VITALS — BP 117/70 | HR 92 | Temp 98.7°F | Resp 28

## 2014-04-14 DIAGNOSIS — E039 Hypothyroidism, unspecified: Secondary | ICD-10-CM | POA: Diagnosis not present

## 2014-04-14 DIAGNOSIS — C321 Malignant neoplasm of supraglottis: Secondary | ICD-10-CM

## 2014-04-14 DIAGNOSIS — C7951 Secondary malignant neoplasm of bone: Secondary | ICD-10-CM | POA: Diagnosis not present

## 2014-04-14 DIAGNOSIS — Z923 Personal history of irradiation: Secondary | ICD-10-CM | POA: Insufficient documentation

## 2014-04-14 DIAGNOSIS — R911 Solitary pulmonary nodule: Secondary | ICD-10-CM | POA: Insufficient documentation

## 2014-04-14 DIAGNOSIS — Z9221 Personal history of antineoplastic chemotherapy: Secondary | ICD-10-CM | POA: Insufficient documentation

## 2014-04-14 DIAGNOSIS — Z9002 Acquired absence of larynx: Secondary | ICD-10-CM | POA: Insufficient documentation

## 2014-04-14 DIAGNOSIS — M47812 Spondylosis without myelopathy or radiculopathy, cervical region: Secondary | ICD-10-CM | POA: Diagnosis not present

## 2014-04-14 DIAGNOSIS — Z452 Encounter for adjustment and management of vascular access device: Secondary | ICD-10-CM

## 2014-04-14 LAB — COMPREHENSIVE METABOLIC PANEL (CC13)
ALBUMIN: 3.1 g/dL — AB (ref 3.5–5.0)
ALT: 15 U/L (ref 0–55)
ANION GAP: 8 meq/L (ref 3–11)
AST: 33 U/L (ref 5–34)
Alkaline Phosphatase: 99 U/L (ref 40–150)
BUN: 11.3 mg/dL (ref 7.0–26.0)
CALCIUM: 9.1 mg/dL (ref 8.4–10.4)
CHLORIDE: 98 meq/L (ref 98–109)
CO2: 23 mEq/L (ref 22–29)
Creatinine: 1.1 mg/dL (ref 0.7–1.3)
Glucose: 92 mg/dl (ref 70–140)
Potassium: 4.1 mEq/L (ref 3.5–5.1)
Sodium: 129 mEq/L — ABNORMAL LOW (ref 136–145)
Total Bilirubin: 0.53 mg/dL (ref 0.20–1.20)
Total Protein: 8.2 g/dL (ref 6.4–8.3)

## 2014-04-14 LAB — CBC WITH DIFFERENTIAL/PLATELET
BASO%: 0.6 % (ref 0.0–2.0)
BASOS ABS: 0 10*3/uL (ref 0.0–0.1)
EOS ABS: 0.1 10*3/uL (ref 0.0–0.5)
EOS%: 1.2 % (ref 0.0–7.0)
HEMATOCRIT: 31.7 % — AB (ref 38.4–49.9)
HEMOGLOBIN: 10.2 g/dL — AB (ref 13.0–17.1)
LYMPH#: 1 10*3/uL (ref 0.9–3.3)
LYMPH%: 21.4 % (ref 14.0–49.0)
MCH: 28.9 pg (ref 27.2–33.4)
MCHC: 32.1 g/dL (ref 32.0–36.0)
MCV: 89.8 fL (ref 79.3–98.0)
MONO#: 0.8 10*3/uL (ref 0.1–0.9)
MONO%: 16.2 % — ABNORMAL HIGH (ref 0.0–14.0)
NEUT%: 60.6 % (ref 39.0–75.0)
NEUTROS ABS: 3 10*3/uL (ref 1.5–6.5)
Platelets: 280 10*3/uL (ref 140–400)
RBC: 3.52 10*6/uL — ABNORMAL LOW (ref 4.20–5.82)
RDW: 15.2 % — AB (ref 11.0–14.6)
WBC: 4.9 10*3/uL (ref 4.0–10.3)

## 2014-04-14 LAB — T4, FREE: FREE T4: 1.11 ng/dL (ref 0.80–1.80)

## 2014-04-14 MED ORDER — IOHEXOL 300 MG/ML  SOLN
100.0000 mL | Freq: Once | INTRAMUSCULAR | Status: AC | PRN
  Administered 2014-04-14: 100 mL via INTRAVENOUS

## 2014-04-14 MED ORDER — SODIUM CHLORIDE 0.9 % IJ SOLN
10.0000 mL | INTRAMUSCULAR | Status: DC | PRN
  Administered 2014-04-14: 10 mL via INTRAVENOUS
  Filled 2014-04-14: qty 10

## 2014-04-14 MED ORDER — HEPARIN SOD (PORK) LOCK FLUSH 100 UNIT/ML IV SOLN
500.0000 [IU] | Freq: Once | INTRAVENOUS | Status: AC
  Administered 2014-04-14: 500 [IU] via INTRAVENOUS
  Filled 2014-04-14: qty 5

## 2014-04-17 ENCOUNTER — Ambulatory Visit (HOSPITAL_BASED_OUTPATIENT_CLINIC_OR_DEPARTMENT_OTHER): Payer: Medicare Other | Admitting: Hematology and Oncology

## 2014-04-17 ENCOUNTER — Telehealth: Payer: Self-pay | Admitting: Hematology and Oncology

## 2014-04-17 ENCOUNTER — Encounter: Payer: Self-pay | Admitting: Hematology and Oncology

## 2014-04-17 VITALS — BP 127/65 | HR 91 | Temp 97.8°F | Resp 18 | Ht 69.0 in | Wt 123.3 lb

## 2014-04-17 DIAGNOSIS — E871 Hypo-osmolality and hyponatremia: Secondary | ICD-10-CM

## 2014-04-17 DIAGNOSIS — D630 Anemia in neoplastic disease: Secondary | ICD-10-CM

## 2014-04-17 DIAGNOSIS — D649 Anemia, unspecified: Secondary | ICD-10-CM

## 2014-04-17 DIAGNOSIS — E039 Hypothyroidism, unspecified: Secondary | ICD-10-CM

## 2014-04-17 DIAGNOSIS — C321 Malignant neoplasm of supraglottis: Secondary | ICD-10-CM

## 2014-04-17 LAB — TSH CHCC: TSH: 5.453 m[IU]/L — AB (ref 0.320–4.118)

## 2014-04-17 MED ORDER — LEVOTHYROXINE SODIUM 75 MCG PO TABS: 75.0000 ug | ORAL_TABLET | Freq: Every day | ORAL | Status: AC

## 2014-04-17 NOTE — Assessment & Plan Note (Signed)
His TSH is elevated and the patient is fatigued. I recommend increasing his thyroid replacement therapy to 75 g.

## 2014-04-17 NOTE — Progress Notes (Signed)
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     07/09/2012 Initial Diagnosis Carcinoma of supraglottis from biopsy, HPV negative   08/13/2012 Surgery He underwent laryngectomy and bilateral LN dissection. There were bilateral LN involvement and 10/25 LN were positive with extracapsular extension staging T3N2Mx   09/20/2012 - 10/29/2012 Chemotherapy he received concurrent chemo/Rt with weekly cisplatin   02/23/2013 Imaging Staging PET/Ct scan showed focal hypermetabolic bone lesions from metastatic cancer   03/09/2013 Procedure Placement of infusaport under IR   03/15/2013 - 07/19/2013 Chemotherapy Start cycle 1 palliative chemotherapy with Weekly Carboplatin & Paclitacel with mild dosage adjustment due to elevated creatinine. Chemotherapy was subsequently discontinued as [name]   05/03/2013 Imaging Repeat PEt/CT showed no evidence of local laryngeal squamous cell carcinoma in the neck & interval resolution of metabolic activity associated skeletal metastases.    05/10/2013 Adverse Reaction Dose of Paclitxel was reduced by 20% due to worsening neuropathy   07/15/2013 Imaging Repeat PET CT scan show resolution of his disease in the bones. There is mild  inflammatory/infectious process in his lungs but the patient is not symptomatic   10/14/2013 Imaging CT scan show no evidence of disease progression.   04/14/2014 Imaging repeat CT scan show no evidence of disease progression    INTERVAL HISTORY: Please see below for problem oriented charting. He complained of chronic fatigue. He has very mild persistent residual neuropathy. He denies recent infection.  REVIEW OF SYSTEMS:   Constitutional: Denies fevers, chills or abnormal weight loss Eyes: Denies blurriness of vision Ears, nose, mouth, throat, and face: Denies mucositis or sore throat Respiratory: Denies cough, dyspnea or wheezes Cardiovascular: Denies palpitation, chest discomfort or lower extremity swelling Gastrointestinal:  Denies nausea, heartburn or change in bowel habits Skin: Denies abnormal skin rashes Lymphatics: Denies new lymphadenopathy or easy bruising Neurological:Denies numbness, tingling or new weaknesses Behavioral/Psych: Mood is stable, no new changes  All other systems were reviewed with the patient and are negative.  I have reviewed the past medical history, past surgical history, social history and family history with the patient and they are unchanged from previous note.  ALLERGIES:  has No Known Allergies.  MEDICATIONS:  Current Outpatient Prescriptions  Medication Sig Dispense Refill  . acetaminophen (TYLENOL) 325 MG tablet Take 650 mg by mouth daily as needed for pain.    Marland Kitchen levothyroxine (SYNTHROID, LEVOTHROID) 75 MCG tablet Take 1 tablet (75 mcg total) by mouth daily before breakfast. 30 tablet 6  . Multiple Vitamin (MULTIVITAMIN) tablet Take 1 tablet by mouth daily.     No current facility-administered medications for this visit.    PHYSICAL EXAMINATION: ECOG PERFORMANCE STATUS: 1 - Symptomatic but completely ambulatory  Filed Vitals:   04/17/14 0912  BP: 127/65  Pulse: 91  Temp: 97.8 F (36.6 C)  Resp: 18   Filed Weights   04/17/14 0912   Weight: 123 lb 4.8 oz (55.929 kg)    GENERAL:alert, no distress and comfortable SKIN: skin color, texture, turgor are normal, no rashes or significant lesions EYES: normal, Conjunctiva are pink and non-injected, sclera clear OROPHARYNX:no exudate, no erythema and lips, buccal mucosa, and tongue normal  NECK: tracheostomy in situ. His neck is thick. LYMPH:  no palpable lymphadenopathy in the cervical, axillary or inguinal LUNGS: clear to auscultation and percussion with normal breathing effort HEART: regular rate & rhythm and no murmurs and no lower extremity edema ABDOMEN:abdomen soft, non-tender and normal bowel sounds Musculoskeletal:no cyanosis of digits and no clubbing  NEURO: alert & oriented x 3 with fluent speech, no focal motor/sensory deficits  LABORATORY DATA:  I have reviewed the data as listed    Component Value Date/Time   NA 129* 04/14/2014 1603   NA 136 08/19/2012 0625   K 4.1 04/14/2014 1603   K 4.3 08/19/2012 0625   CL 98 11/15/2012 0941   CL 100 08/19/2012 0625   CO2 23 04/14/2014 1603   CO2 28 08/19/2012 0625   GLUCOSE 92 04/14/2014 1603   GLUCOSE 90 11/15/2012 0941   GLUCOSE 109* 08/19/2012 0625   BUN 11.3 04/14/2014 1603   BUN 9 08/19/2012 0625   CREATININE 1.1 04/14/2014 1603   CREATININE 0.69 08/19/2012 0625   CALCIUM 9.1 04/14/2014 1603   CALCIUM 8.3* 08/20/2012 0542   PROT 8.2 04/14/2014 1603   PROT 6.9 08/19/2012 0625   ALBUMIN 3.1* 04/14/2014 1603   ALBUMIN 2.4* 08/19/2012 0625   AST 33 04/14/2014 1603   AST 25 08/19/2012 0625   ALT 15 04/14/2014 1603   ALT 20 08/19/2012 0625   ALKPHOS 99 04/14/2014 1603   ALKPHOS 57 08/19/2012 0625   BILITOT 0.53 04/14/2014 1603   BILITOT 0.6 08/19/2012 0625   GFRNONAA >90 08/19/2012 0625   GFRAA >90 08/19/2012 0625    No results found for: SPEP, UPEP  Lab Results  Component Value Date   WBC 4.9 04/14/2014   NEUTROABS 3.0 04/14/2014   HGB 10.2* 04/14/2014   HCT 31.7* 04/14/2014   MCV 89.8  04/14/2014   PLT 280 04/14/2014      Chemistry      Component Value Date/Time   NA 129* 04/14/2014 1603   NA 136 08/19/2012 0625   K 4.1 04/14/2014 1603   K 4.3 08/19/2012 0625   CL 98 11/15/2012 0941   CL 100 08/19/2012 0625   CO2 23 04/14/2014 1603   CO2 28 08/19/2012 0625   BUN 11.3 04/14/2014 1603   BUN 9 08/19/2012 0625   CREATININE 1.1 04/14/2014 1603   CREATININE 0.69 08/19/2012 0625      Component Value Date/Time   CALCIUM 9.1 04/14/2014 1603   CALCIUM 8.3* 08/20/2012 0542   ALKPHOS 99 04/14/2014 1603   ALKPHOS 57 08/19/2012 0625   AST 33 04/14/2014 1603   AST 25 08/19/2012 0625   ALT 15 04/14/2014 1603   ALT 20 08/19/2012 0625   BILITOT 0.53 04/14/2014 1603   BILITOT 0.6 08/19/2012 0625       RADIOGRAPHIC STUDIES:I reviewed the imaging study with him and family. I have personally reviewed the radiological images as listed and agreed with the findings in the report.  ASSESSMENT & PLAN:  Carcinoma of supraglottis Clinically, he has no signs of disease progression. I will see him back in 6 months with history and physical examination along with blood work.  I will image him once a year from now on.  Hypothyroidism His TSH is elevated and the patient is fatigued. I recommend increasing his thyroid replacement therapy to 75 g.  Hyponatremia This is chronic in nature. He is not symptomatic. I will observe only.  Anemia in neoplastic disease This is likely anemia of chronic disease. The patient denies recent history of bleeding such as epistaxis, hematuria or hematochezia. He is asymptomatic from the anemia. We will observe for now.  He does not require transfusion now.       Orders Placed This Encounter  Procedures  . T4, free    Standing Status: Future     Number of Occurrences:      Standing Expiration Date: 05/22/2015  . TSH    Standing Status: Future     Number of Occurrences:      Standing Expiration Date: 05/22/2015   All questions were  answered. The patient knows to call the clinic with any problems, questions or concerns. No barriers to learning was detected. I spent 25 minutes counseling the patient face to face. The total time spent in the appointment was 30 minutes and more than 50% was on counseling and review of test results     [facility], [place], MD 04/17/2014 1:54 PM

## 2014-04-17 NOTE — Assessment & Plan Note (Signed)
This is likely anemia of chronic disease. The patient denies recent history of bleeding such as epistaxis, hematuria or hematochezia. He is asymptomatic from the anemia. We will observe for now.  He does not require transfusion now.   

## 2014-04-17 NOTE — Assessment & Plan Note (Signed)
Clinically, he has no signs of disease progression. I will see him back in 6 months with history and physical examination along with blood work. I will image him once a year from now on.

## 2014-04-17 NOTE — Assessment & Plan Note (Signed)
This is chronic in nature. He is not symptomatic. I will observe only.

## 2014-04-17 NOTE — Telephone Encounter (Signed)
Gave avs & cal for May 2016. °

## 2014-06-28 ENCOUNTER — Telehealth: Payer: Self-pay | Admitting: Hematology and Oncology

## 2014-06-28 NOTE — Telephone Encounter (Signed)
s.w. pt dtr and sched flush....she is ok and aware of d.t

## 2014-06-29 ENCOUNTER — Ambulatory Visit (HOSPITAL_BASED_OUTPATIENT_CLINIC_OR_DEPARTMENT_OTHER): Payer: Medicare Other

## 2014-06-29 DIAGNOSIS — Z95828 Presence of other vascular implants and grafts: Secondary | ICD-10-CM

## 2014-06-29 DIAGNOSIS — C321 Malignant neoplasm of supraglottis: Secondary | ICD-10-CM

## 2014-06-29 DIAGNOSIS — Z452 Encounter for adjustment and management of vascular access device: Secondary | ICD-10-CM

## 2014-06-29 MED ORDER — HEPARIN SOD (PORK) LOCK FLUSH 100 UNIT/ML IV SOLN
500.0000 [IU] | Freq: Once | INTRAVENOUS | Status: AC
  Administered 2014-06-29: 500 [IU] via INTRAVENOUS
  Filled 2014-06-29: qty 5

## 2014-06-29 MED ORDER — SODIUM CHLORIDE 0.9 % IJ SOLN
10.0000 mL | INTRAMUSCULAR | Status: DC | PRN
  Administered 2014-06-29: 10 mL via INTRAVENOUS
  Filled 2014-06-29: qty 10

## 2014-06-29 NOTE — Patient Instructions (Signed)

## 2014-07-21 ENCOUNTER — Telehealth: Payer: Self-pay | Admitting: Hematology and Oncology

## 2014-07-21 ENCOUNTER — Telehealth: Payer: Self-pay | Admitting: *Deleted

## 2014-07-21 NOTE — Telephone Encounter (Signed)
pt daughter called to get an appt pt not eating....transferred to triage

## 2014-07-21 NOTE — Telephone Encounter (Signed)
Daughter reports pt having some difficulty swallowing, decreased appetite, nausea and increase in mucous in his throat.  She is concerned he is not getting enough nutrition,  Eating only breakfast, then not really hungry for the rest of the day.  She says he will drink some Ensure and have a few small snacks but c/o nausea most days.   He also says sometimes food feels like it gets stuck when he swallows.

## 2014-07-24 ENCOUNTER — Other Ambulatory Visit: Payer: Self-pay | Admitting: Hematology and Oncology

## 2014-07-24 ENCOUNTER — Telehealth: Payer: Self-pay | Admitting: *Deleted

## 2014-07-24 ENCOUNTER — Other Ambulatory Visit: Payer: Self-pay | Admitting: *Deleted

## 2014-07-24 DIAGNOSIS — C321 Malignant neoplasm of supraglottis: Secondary | ICD-10-CM

## 2014-07-24 MED ORDER — PROCHLORPERAZINE MALEATE 10 MG PO TABS: 10.0000 mg | ORAL_TABLET | Freq: Four times a day (QID) | ORAL | Status: DC | PRN

## 2014-07-24 NOTE — Telephone Encounter (Signed)
Left Vm for pt's daughter informing her Dr. Alvy Bimler has ordered CT scan and office visit for next week.  She should be getting a call from [place].  Instructed to arrange appt for pt to see his ENT as well, per Dr. Alvy Bimler.  Asked her to call back if any questions.

## 2014-07-24 NOTE — Telephone Encounter (Signed)
I will place orders for labs and repeat CT scan for further eval and see him next week. In the mean time, i recommend he also call his ENT for further assessment and push oral fluids. Does he has antiemetics to take?

## 2014-07-25 ENCOUNTER — Telehealth: Payer: Self-pay | Admitting: Hematology and Oncology

## 2014-07-25 ENCOUNTER — Other Ambulatory Visit: Payer: Self-pay | Admitting: *Deleted

## 2014-07-25 NOTE — Telephone Encounter (Signed)
s.w. pt wife and advised on added lab b4 ct....ok and aware

## 2014-07-25 NOTE — Telephone Encounter (Signed)
s.w. pt wife and advised on all appts...ok and aware °

## 2014-07-27 ENCOUNTER — Ambulatory Visit: Payer: Medicare Other

## 2014-07-27 ENCOUNTER — Other Ambulatory Visit (HOSPITAL_BASED_OUTPATIENT_CLINIC_OR_DEPARTMENT_OTHER): Payer: Medicare Other

## 2014-07-27 ENCOUNTER — Telehealth: Payer: Self-pay | Admitting: *Deleted

## 2014-07-27 ENCOUNTER — Encounter (HOSPITAL_COMMUNITY): Payer: Self-pay

## 2014-07-27 ENCOUNTER — Ambulatory Visit (HOSPITAL_COMMUNITY)
Admission: RE | Admit: 2014-07-27 | Discharge: 2014-07-27 | Disposition: A | Discharge status: Home / Self Care | Payer: Medicare Other | Source: Ambulatory Visit | Attending: Hematology and Oncology | Admitting: Hematology and Oncology

## 2014-07-27 ENCOUNTER — Telehealth: Payer: Self-pay | Admitting: Hematology and Oncology

## 2014-07-27 ENCOUNTER — Other Ambulatory Visit: Payer: Self-pay | Admitting: Hematology and Oncology

## 2014-07-27 ENCOUNTER — Ambulatory Visit (HOSPITAL_BASED_OUTPATIENT_CLINIC_OR_DEPARTMENT_OTHER): Payer: Medicare Other

## 2014-07-27 VITALS — BP 113/63 | HR 74 | Temp 97.6°F

## 2014-07-27 DIAGNOSIS — Z923 Personal history of irradiation: Secondary | ICD-10-CM | POA: Insufficient documentation

## 2014-07-27 DIAGNOSIS — E039 Hypothyroidism, unspecified: Secondary | ICD-10-CM

## 2014-07-27 DIAGNOSIS — C321 Malignant neoplasm of supraglottis: Secondary | ICD-10-CM | POA: Diagnosis present

## 2014-07-27 DIAGNOSIS — Z95828 Presence of other vascular implants and grafts: Secondary | ICD-10-CM

## 2014-07-27 DIAGNOSIS — D630 Anemia in neoplastic disease: Secondary | ICD-10-CM

## 2014-07-27 DIAGNOSIS — Z9221 Personal history of antineoplastic chemotherapy: Secondary | ICD-10-CM | POA: Diagnosis not present

## 2014-07-27 DIAGNOSIS — D649 Anemia, unspecified: Secondary | ICD-10-CM

## 2014-07-27 DIAGNOSIS — Z452 Encounter for adjustment and management of vascular access device: Secondary | ICD-10-CM

## 2014-07-27 LAB — CBC WITH DIFFERENTIAL/PLATELET
BASO%: 1 % (ref 0.0–2.0)
BASOS ABS: 0.1 10*3/uL (ref 0.0–0.1)
EOS%: 1.2 % (ref 0.0–7.0)
Eosinophils Absolute: 0.1 10*3/uL (ref 0.0–0.5)
HCT: 23 % — ABNORMAL LOW (ref 38.4–49.9)
HGB: 7.4 g/dL — ABNORMAL LOW (ref 13.0–17.1)
LYMPH%: 13.4 % — ABNORMAL LOW (ref 14.0–49.0)
MCH: 26 pg — AB (ref 27.2–33.4)
MCHC: 32.3 g/dL (ref 32.0–36.0)
MCV: 80.5 fL (ref 79.3–98.0)
MONO#: 1 10*3/uL — ABNORMAL HIGH (ref 0.1–0.9)
MONO%: 14.4 % — ABNORMAL HIGH (ref 0.0–14.0)
NEUT#: 5.1 10*3/uL (ref 1.5–6.5)
NEUT%: 70 % (ref 39.0–75.0)
Platelets: 439 10*3/uL — ABNORMAL HIGH (ref 140–400)
RBC: 2.86 10*6/uL — ABNORMAL LOW (ref 4.20–5.82)
RDW: 18.8 % — ABNORMAL HIGH (ref 11.0–14.6)
WBC: 7.3 10*3/uL (ref 4.0–10.3)
lymph#: 1 10*3/uL (ref 0.9–3.3)

## 2014-07-27 LAB — COMPREHENSIVE METABOLIC PANEL (CC13)
ALT: 22 U/L (ref 0–55)
ANION GAP: 8 meq/L (ref 3–11)
AST: 101 U/L — ABNORMAL HIGH (ref 5–34)
Albumin: 2.3 g/dL — ABNORMAL LOW (ref 3.5–5.0)
Alkaline Phosphatase: 101 U/L (ref 40–150)
BILIRUBIN TOTAL: 0.4 mg/dL (ref 0.20–1.20)
BUN: 14.1 mg/dL (ref 7.0–26.0)
CO2: 24 meq/L (ref 22–29)
CREATININE: 1.1 mg/dL (ref 0.7–1.3)
Calcium: 8.6 mg/dL (ref 8.4–10.4)
Chloride: 100 mEq/L (ref 98–109)
EGFR: 80 mL/min/{1.73_m2} — AB (ref >90)
GLUCOSE: 97 mg/dL (ref 70–140)
Potassium: 3.9 mEq/L (ref 3.5–5.1)
Sodium: 132 mEq/L — ABNORMAL LOW (ref 136–145)
Total Protein: 8.3 g/dL (ref 6.4–8.3)

## 2014-07-27 LAB — TSH CHCC: TSH: 5.134 m(IU)/L — ABNORMAL HIGH (ref 0.320–4.118)

## 2014-07-27 LAB — ABO/RH: ABO/RH(D): B NEG

## 2014-07-27 LAB — T4, FREE: Free T4: 0.98 ng/dL (ref 0.80–1.80)

## 2014-07-27 MED ORDER — SODIUM CHLORIDE 0.9 % IJ SOLN
10.0000 mL | INTRAMUSCULAR | Status: DC | PRN
  Administered 2014-07-27: 10 mL via INTRAVENOUS
  Filled 2014-07-27: qty 10

## 2014-07-27 MED ORDER — IOHEXOL 300 MG/ML  SOLN
100.0000 mL | Freq: Once | INTRAMUSCULAR | Status: AC | PRN
  Administered 2014-07-27: 100 mL via INTRAVENOUS

## 2014-07-27 MED ORDER — HEPARIN SOD (PORK) LOCK FLUSH 100 UNIT/ML IV SOLN
500.0000 [IU] | Freq: Once | INTRAVENOUS | Status: AC
  Administered 2014-07-27: 500 [IU] via INTRAVENOUS
  Filled 2014-07-27: qty 5

## 2014-07-27 NOTE — Telephone Encounter (Signed)
added appts per pof.Marland KitchenMarland KitchenMarland KitchenMarland Kitchenper pof Cameo will advised pt on 2.19 appt

## 2014-07-27 NOTE — Telephone Encounter (Signed)
added appt per pof....per pof pt awareof 2.24 appts

## 2014-07-27 NOTE — Telephone Encounter (Signed)
Transfusion appt changed to 0830. Pt's daughter Solmon Ice made aware and voiced understanding.

## 2014-07-27 NOTE — Telephone Encounter (Signed)
Instructed pt's daughter of anemia and need for transfusion tomorrow per Dr. Alvy Bimler.  Pt currently in Radiology waiting for CT scan.   Instructed daughter to bring pt back to [facility] for lab for type and cross today after his CT scan and plan for transfusion appt tomorrow.  She verbalized understanding.

## 2014-07-27 NOTE — Telephone Encounter (Signed)
Pt returned to clinic for type and cross.  Instructed pt and daughter to keep blue blood band on for transfusion tomorrow.  Return for transfusion tomorrow at noon.  Also informed a lab will be added on MD appt next week so they need to arrive at least 30 min earlier for lab.  Daughter verbalized understanding.

## 2014-07-27 NOTE — Patient Instructions (Signed)

## 2014-07-28 ENCOUNTER — Ambulatory Visit (HOSPITAL_BASED_OUTPATIENT_CLINIC_OR_DEPARTMENT_OTHER): Payer: Medicare Other

## 2014-07-28 ENCOUNTER — Encounter: Payer: Self-pay | Admitting: *Deleted

## 2014-07-28 VITALS — BP 111/57 | HR 88 | Temp 98.7°F | Resp 24

## 2014-07-28 DIAGNOSIS — C321 Malignant neoplasm of supraglottis: Secondary | ICD-10-CM | POA: Diagnosis not present

## 2014-07-28 DIAGNOSIS — D630 Anemia in neoplastic disease: Secondary | ICD-10-CM

## 2014-07-28 DIAGNOSIS — D649 Anemia, unspecified: Secondary | ICD-10-CM

## 2014-07-28 LAB — PREPARE RBC (CROSSMATCH)

## 2014-07-28 MED ORDER — HEPARIN SOD (PORK) LOCK FLUSH 100 UNIT/ML IV SOLN
500.0000 [IU] | Freq: Every day | INTRAVENOUS | Status: AC | PRN
  Administered 2014-07-28: 500 [IU]
  Filled 2014-07-28: qty 5

## 2014-07-28 MED ORDER — SODIUM CHLORIDE 0.9 % IV SOLN
250.0000 mL | Freq: Once | INTRAVENOUS | Status: AC
  Administered 2014-07-28: 250 mL via INTRAVENOUS

## 2014-07-28 MED ORDER — SODIUM CHLORIDE 0.9 % IJ SOLN
10.0000 mL | INTRAMUSCULAR | Status: AC | PRN
  Administered 2014-07-28: 10 mL
  Filled 2014-07-28: qty 10

## 2014-07-28 NOTE — Patient Instructions (Signed)

## 2014-07-30 LAB — TYPE AND SCREEN
ABO/RH(D): B NEG
Antibody Screen: NEGATIVE
UNIT DIVISION: 0
Unit division: 0

## 2014-08-01 ENCOUNTER — Other Ambulatory Visit: Payer: Self-pay

## 2014-08-01 ENCOUNTER — Other Ambulatory Visit: Payer: Self-pay | Admitting: Hematology and Oncology

## 2014-08-01 DIAGNOSIS — D630 Anemia in neoplastic disease: Secondary | ICD-10-CM

## 2014-08-02 ENCOUNTER — Telehealth: Payer: Self-pay | Admitting: Hematology and Oncology

## 2014-08-02 ENCOUNTER — Encounter: Payer: Self-pay | Admitting: Hematology and Oncology

## 2014-08-02 ENCOUNTER — Telehealth: Payer: Self-pay | Admitting: *Deleted

## 2014-08-02 ENCOUNTER — Ambulatory Visit (HOSPITAL_BASED_OUTPATIENT_CLINIC_OR_DEPARTMENT_OTHER): Payer: Medicare Other | Admitting: Hematology and Oncology

## 2014-08-02 ENCOUNTER — Ambulatory Visit (HOSPITAL_BASED_OUTPATIENT_CLINIC_OR_DEPARTMENT_OTHER): Payer: Medicare Other

## 2014-08-02 ENCOUNTER — Other Ambulatory Visit (HOSPITAL_BASED_OUTPATIENT_CLINIC_OR_DEPARTMENT_OTHER): Payer: Medicare Other

## 2014-08-02 VITALS — BP 109/63 | HR 90 | Temp 96.8°F | Resp 16 | Ht 69.0 in | Wt 117.4 lb

## 2014-08-02 VITALS — BP 106/59 | HR 85 | Temp 97.5°F

## 2014-08-02 DIAGNOSIS — E039 Hypothyroidism, unspecified: Secondary | ICD-10-CM

## 2014-08-02 DIAGNOSIS — Z452 Encounter for adjustment and management of vascular access device: Secondary | ICD-10-CM

## 2014-08-02 DIAGNOSIS — C321 Malignant neoplasm of supraglottis: Secondary | ICD-10-CM

## 2014-08-02 DIAGNOSIS — K5909 Other constipation: Secondary | ICD-10-CM

## 2014-08-02 DIAGNOSIS — G620 Drug-induced polyneuropathy: Secondary | ICD-10-CM

## 2014-08-02 DIAGNOSIS — D630 Anemia in neoplastic disease: Secondary | ICD-10-CM

## 2014-08-02 DIAGNOSIS — T451X5A Adverse effect of antineoplastic and immunosuppressive drugs, initial encounter: Secondary | ICD-10-CM

## 2014-08-02 DIAGNOSIS — Z95828 Presence of other vascular implants and grafts: Secondary | ICD-10-CM

## 2014-08-02 DIAGNOSIS — C799 Secondary malignant neoplasm of unspecified site: Secondary | ICD-10-CM

## 2014-08-02 DIAGNOSIS — R634 Abnormal weight loss: Secondary | ICD-10-CM

## 2014-08-02 DIAGNOSIS — C7951 Secondary malignant neoplasm of bone: Secondary | ICD-10-CM

## 2014-08-02 DIAGNOSIS — D649 Anemia, unspecified: Secondary | ICD-10-CM

## 2014-08-02 HISTORY — DX: Secondary malignant neoplasm of unspecified site: C79.9

## 2014-08-02 HISTORY — DX: Abnormal weight loss: R63.4

## 2014-08-02 LAB — CBC WITH DIFFERENTIAL/PLATELET
BASO%: 0.3 % (ref 0.0–2.0)
Basophils Absolute: 0 10*3/uL (ref 0.0–0.1)
EOS ABS: 0.1 10*3/uL (ref 0.0–0.5)
EOS%: 0.6 % (ref 0.0–7.0)
HEMATOCRIT: 29.3 % — AB (ref 38.4–49.9)
HEMOGLOBIN: 9.6 g/dL — AB (ref 13.0–17.1)
LYMPH%: 12.4 % — ABNORMAL LOW (ref 14.0–49.0)
MCH: 26.4 pg — ABNORMAL LOW (ref 27.2–33.4)
MCHC: 32.8 g/dL (ref 32.0–36.0)
MCV: 80.7 fL (ref 79.3–98.0)
MONO#: 1.4 10*3/uL — AB (ref 0.1–0.9)
MONO%: 15.6 % — AB (ref 0.0–14.0)
NEUT#: 6.3 10*3/uL (ref 1.5–6.5)
NEUT%: 71.1 % (ref 39.0–75.0)
Platelets: 320 10*3/uL (ref 140–400)
RBC: 3.63 10*6/uL — AB (ref 4.20–5.82)
RDW: 17.9 % — ABNORMAL HIGH (ref 11.0–14.6)
WBC: 8.8 10*3/uL (ref 4.0–10.3)
lymph#: 1.1 10*3/uL (ref 0.9–3.3)

## 2014-08-02 LAB — HOLD TUBE, BLOOD BANK

## 2014-08-02 MED ORDER — HEPARIN SOD (PORK) LOCK FLUSH 100 UNIT/ML IV SOLN
500.0000 [IU] | Freq: Once | INTRAVENOUS | Status: AC
  Administered 2014-08-02: 500 [IU] via INTRAVENOUS
  Filled 2014-08-02: qty 5

## 2014-08-02 MED ORDER — OXYCODONE HCL 5 MG PO TABS: 5.0000 mg | ORAL_TABLET | ORAL | Status: AC | PRN

## 2014-08-02 MED ORDER — POLYETHYLENE GLYCOL 3350 17 G PO PACK: 17.0000 g | PACK | Freq: Every day | ORAL | Status: AC

## 2014-08-02 MED ORDER — SODIUM CHLORIDE 0.9 % IJ SOLN
10.0000 mL | INTRAMUSCULAR | Status: DC | PRN
  Administered 2014-08-02: 10 mL via INTRAVENOUS
  Filled 2014-08-02: qty 10

## 2014-08-02 NOTE — Telephone Encounter (Signed)
Per staff message and POF I have scheduled appts. Advised scheduler of appts. JMW  

## 2014-08-02 NOTE — Patient Instructions (Signed)

## 2014-08-02 NOTE — Telephone Encounter (Signed)
Pt confirmed MD visit per 02/24 POF, gave pt AVS.... KJ, sent msg to add chemo and gave pt barium

## 2014-08-02 NOTE — Telephone Encounter (Signed)
S/w pt's daughter Solmon Ice confirming MD visit per 02/24 POF, also advised [facility] will call to set up CT and whole body scan..... KJ

## 2014-08-03 DIAGNOSIS — K5909 Other constipation: Secondary | ICD-10-CM | POA: Insufficient documentation

## 2014-08-03 NOTE — Assessment & Plan Note (Signed)
I'm concerned about potential nervous involvement. I will order CT scan of the abdomen and pelvis especially if the patient had pain in that region. In the meantime, he will continue laxative therapy.

## 2014-08-03 NOTE — Assessment & Plan Note (Signed)
This is likely anemia of chronic disease and from bone mets. The patient denies recent history of bleeding such as epistaxis, hematuria or hematochezia. He is asymptomatic from the anemia. We will observe for now.  He does not require transfusion now.

## 2014-08-03 NOTE — Assessment & Plan Note (Signed)
Unfortunately, he had new signs of disease recurrence. The present time, he is at stage IV metastatic cancer. The role of treatment would be strictly palliative in nature. Previously, we have good success of controlling his disease with combination therapy with carboplatin and Taxol. However, I am concerned about his poor performance status and persistent peripheral neuropathy. I recommend we do single agent paclitaxel only. Estimated response rate would be in the region of 20%. I would like to complete his staging with CT scan of the abdomen and pelvis and bone scan. I will see him back next week to review test results and to start him on treatment. We discussed the role of chemotherapy. The intent is for palliative.  We discussed some of the risks, benefits, side-effects of Paclitaxel.   Some of the short term side-effects included, though not limited to, risk of severe allergic reaction, fatigue, weight loss, pancytopenia, life-threatening infections, need for transfusions of blood products, nausea, vomiting, change in bowel habits, loss of hair, admission to hospital for various reasons, and risks of death.   Long term side-effects are also discussed including risks of infertility, permanent damage to nerve function, chronic fatigue, and rare secondary malignancy including bone marrow disorders.   The patient is aware that the response rates discussed earlier is not guaranteed.    After a long discussion, patient made an informed decision to proceed with the prescribed plan of care and went ahead to sign the consent form today.   Patient education material was dispensed

## 2014-08-03 NOTE — Progress Notes (Signed)
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     07/09/2012 Initial Diagnosis Carcinoma of supraglottis from biopsy, HPV negative   08/13/2012 Surgery He underwent laryngectomy and bilateral LN dissection. There were bilateral LN involvement and 10/25 LN were positive with extracapsular extension staging T3N2Mx   09/20/2012 - 10/29/2012 Chemotherapy he received concurrent chemo/Rt with weekly cisplatin   02/23/2013 Imaging Staging PET/Ct scan showed focal hypermetabolic bone lesions from metastatic cancer   03/09/2013 Procedure Placement of infusaport under IR   03/15/2013 - 07/19/2013 Chemotherapy Start cycle 1 palliative chemotherapy with Weekly Carboplatin & Paclitaxel with mild dosage adjustment due to elevated creatinine. Chemotherapy was subsequently discontinued as [name]   05/03/2013 Imaging Repeat PEt/CT showed no evidence of local laryngeal squamous cell carcinoma in the neck & interval resolution of metabolic activity associated skeletal metastases.    05/10/2013 Adverse Reaction Dose of Paclitxel was reduced by 20% due to worsening neuropathy   07/15/2013 Imaging Repeat PET CT scan show resolution of his disease in the bones. There is mild  inflammatory/infectious process in his lungs but the patient is not symptomatic   10/14/2013 Imaging CT scan show no evidence of disease progression.   04/14/2014 Imaging repeat CT scan show no evidence of disease progression   07/27/2014 Imaging CT scan of the neck and the chest showed no lymphadenopathy, lung nodule and bone metastasis    INTERVAL HISTORY: Please see below for problem oriented charting. He is seen to review test results. He has lost a lot of weight. He has reduced appetite. He has some pain and discomfort in the prostate region and have productive cough with thick mucus. He has shortness of breath. He felt a bit better after recent transfusion. His daughter noticed that the patient have decline in energy and performance status over the past few weeks. He has new onset constipation  REVIEW OF SYSTEMS:   Constitutional: Denies fevers, chills or abnormal weight loss Eyes: Denies blurriness of vision Ears, nose, mouth, throat, and face: Denies mucositis or sore throat Cardiovascular: Denies palpitation, chest discomfort or lower extremity swelling Skin: Denies abnormal skin rashes Lymphatics: Denies new lymphadenopathy or easy bruising Neurological:Denies numbness, tingling or new weaknesses Behavioral/Psych: Mood is stable, no new changes  All other systems were reviewed with the patient and are negative.  I have reviewed the past medical history, past surgical history, social history and family history with the patient and they are unchanged from previous note.  ALLERGIES:  has No Known Allergies.  MEDICATIONS:  Current Outpatient Prescriptions  Medication Sig Dispense Refill  . acetaminophen (TYLENOL) 325 MG tablet Take 650 mg by mouth daily as needed for pain.    Marland Kitchen levothyroxine (SYNTHROID, LEVOTHROID) 75 MCG tablet Take 1 tablet (75 mcg total) by mouth daily before breakfast. 30  tablet 6  . Multiple Vitamin (MULTIVITAMIN) tablet Take 1 tablet by mouth daily.    Marland Kitchen  oxyCODONE (OXY IR/ROXICODONE) 5 MG immediate release tablet Take 1 tablet (5 mg total) by mouth every 4 (four) hours as needed for severe pain. 30 tablet 0  . polyethylene glycol (MIRALAX) packet Take 17 g by mouth daily. 14 each 0  . prochlorperazine (COMPAZINE) 10 MG tablet Take 1 tablet (10 mg total) by mouth every 6 (six) hours as needed for nausea or vomiting. 30 tablet 1   No current facility-administered medications for this visit.    PHYSICAL EXAMINATION: ECOG PERFORMANCE STATUS: 2 - Symptomatic, <50% confined to bed  Filed Vitals:   08/02/14 1135  BP: 109/63  Pulse: 90  Temp: 96.8 F (36 C)  Resp: 16   Filed Weights   08/02/14 1135  Weight: 117 lb 6.4 oz (53.252 kg)    GENERAL:alert, no distress and comfortable. He appears thin and cachectic SKIN: skin color, texture, turgor are normal, no rashes or significant lesions EYES: normal, Conjunctiva are pale and non-injected, sclera clear OROPHARYNX:no exudate, no erythema and lips, buccal mucosa, and tongue normal  NECK: Neck is thick from prior surgery and radiation.  Musculoskeletal:no cyanosis of digits and no clubbing  NEURO: alert & oriented x 3 with fluent speech, no focal motor/sensory deficits  LABORATORY DATA:  I have reviewed the data as listed    Component Value Date/Time   NA 132* 07/27/2014 0952   NA 136 08/19/2012 0625   K 3.9 07/27/2014 0952   K 4.3 08/19/2012 0625   CL 98 11/15/2012 0941   CL 100 08/19/2012 0625   CO2 24 07/27/2014 0952   CO2 28 08/19/2012 0625   GLUCOSE 97 07/27/2014 0952   GLUCOSE 90 11/15/2012 0941   GLUCOSE 109* 08/19/2012 0625   BUN 14.1 07/27/2014 0952   BUN 9 08/19/2012 0625   CREATININE 1.1 07/27/2014 0952   CREATININE 0.69 08/19/2012 0625   CALCIUM 8.6 07/27/2014 0952   CALCIUM 8.3* 08/20/2012 0542   PROT 8.3 07/27/2014 0952   PROT 6.9 08/19/2012 0625   ALBUMIN 2.3* 07/27/2014 0952   ALBUMIN 2.4* 08/19/2012 0625   AST 101* 07/27/2014 0952   AST 25 08/19/2012 0625    ALT 22 07/27/2014 0952   ALT 20 08/19/2012 0625   ALKPHOS 101 07/27/2014 0952   ALKPHOS 57 08/19/2012 0625   BILITOT 0.40 07/27/2014 0952   BILITOT 0.6 08/19/2012 0625   GFRNONAA >90 08/19/2012 0625   GFRAA >90 08/19/2012 0625    No results found for: SPEP, UPEP  Lab Results  Component Value Date   WBC 8.8 08/02/2014   NEUTROABS 6.3 08/02/2014   HGB 9.6* 08/02/2014   HCT 29.3* 08/02/2014   MCV 80.7 08/02/2014   PLT 320 08/02/2014      Chemistry      Component Value Date/Time   NA 132* 07/27/2014 0952   NA 136 08/19/2012 0625   K 3.9 07/27/2014 0952   K 4.3 08/19/2012 0625   CL 98 11/15/2012 0941   CL 100 08/19/2012 0625   CO2 24 07/27/2014 0952   CO2 28 08/19/2012 0625   BUN 14.1 07/27/2014 0952   BUN 9 08/19/2012 0625   CREATININE 1.1 07/27/2014 0952   CREATININE 0.69 08/19/2012 0625      Component Value Date/Time   CALCIUM 8.6 07/27/2014 0952   CALCIUM 8.3* 08/20/2012 0542   ALKPHOS 101 07/27/2014 0952   ALKPHOS 57 08/19/2012 0625   AST  101* 07/27/2014 0952   AST 25 08/19/2012 0625   ALT 22 07/27/2014 0952   ALT 20 08/19/2012 0625   BILITOT 0.40 07/27/2014 0952   BILITOT 0.6 08/19/2012 0625       RADIOGRAPHIC STUDIES: I reviewed his recent imaging study with the patient and family I have personally reviewed the radiological images as listed and agreed with the findings in the report.  ASSESSMENT & PLAN:  Carcinoma of supraglottis Unfortunately, he had new signs of disease recurrence. The present time, he is at stage IV metastatic cancer. The role of treatment would be strictly palliative in nature. Previously, we have good success of controlling his disease with combination therapy with carboplatin and Taxol. However, I am concerned about his poor performance status and persistent peripheral neuropathy. I recommend we do single agent paclitaxel only. Estimated response rate would be in the region of 20%. I would like to complete his staging with  CT scan of the abdomen and pelvis and bone scan. I will see him back next week to review test results and to start him on treatment. We discussed the role of chemotherapy. The intent is for palliative.  We discussed some of the risks, benefits, side-effects of Paclitaxel.   Some of the short term side-effects included, though not limited to, risk of severe allergic reaction, fatigue, weight loss, pancytopenia, life-threatening infections, need for transfusions of blood products, nausea, vomiting, change in bowel habits, loss of hair, admission to hospital for various reasons, and risks of death.   Long term side-effects are also discussed including risks of infertility, permanent damage to nerve function, chronic fatigue, and rare secondary malignancy including bone marrow disorders.   The patient is aware that the response rates discussed earlier is not guaranteed.    After a long discussion, patient made an informed decision to proceed with the prescribed plan of care and went ahead to sign the consent form today.   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 The patient denies recent history of bleeding such as epistaxis, hematuria or hematochezia. He is asymptomatic from the anemia. We will observe for now.  He does not require transfusion now.    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 We will continue only single agent paclitaxel in the future.   Weight loss He has significant weight loss and protein calorie malnutrition from disease recurrence. I recommend him to increase oral intake as tolerated. Next week, of the staging scan, I will prescribe additional appetite stimulant. Recent thyroid function tests are within normal limits.   Other constipation I'm concerned about potential nervous involvement. I will order CT scan of the abdomen and pelvis especially  if the patient had pain in that region. In the meantime, he will continue laxative therapy.    Orders Placed This Encounter  Procedures  . CT Abdomen Pelvis W Contrast    Standing Status: Future     Number of Occurrences:      Standing Expiration Date: 11/02/2015    Order Specific Question:  Reason for Exam (SYMPTOM  OR DIAGNOSIS REQUIRED)    Answer:  staging metastatic laryngeal ca, bone mets    Order Specific Question:  Preferred imaging location?    Answer:  [facility]    Standing Status: Future     Number of Occurrences:      Standing  Expiration Date: 09/06/2015    Order Specific Question:  Reason for exam:    Answer:  staging metastatic laryngeal ca, bone mets    Order Specific Question:  Preferred imaging location?    Answer:  [facility]   All questions were answered. The patient knows to call the clinic with any problems, questions or concerns. No barriers to learning was detected. I spent 40 minutes counseling the patient face to face. The total time spent in the appointment was 60 minutes and more than 50% was on counseling and review of test results     [facility], [place], MD 08/03/2014 1:48 PM

## 2014-08-03 NOTE — Assessment & Plan Note (Signed)
He had grade 2 neuropathy. We will continue only single agent paclitaxel in the future.

## 2014-08-03 NOTE — Assessment & Plan Note (Signed)
He has significant weight loss and protein calorie malnutrition from disease recurrence. I recommend him to increase oral intake as tolerated. Next week, of the staging scan, I will prescribe additional appetite stimulant. Recent thyroid function tests are within normal limits.

## 2014-08-09 ENCOUNTER — Ambulatory Visit (HOSPITAL_BASED_OUTPATIENT_CLINIC_OR_DEPARTMENT_OTHER): Payer: Medicare Other | Admitting: Hematology and Oncology

## 2014-08-09 ENCOUNTER — Ambulatory Visit (HOSPITAL_BASED_OUTPATIENT_CLINIC_OR_DEPARTMENT_OTHER): Payer: Medicare Other

## 2014-08-09 ENCOUNTER — Other Ambulatory Visit: Payer: Self-pay | Admitting: Hematology and Oncology

## 2014-08-09 ENCOUNTER — Encounter: Payer: Self-pay | Admitting: Hematology and Oncology

## 2014-08-09 ENCOUNTER — Telehealth: Payer: Self-pay | Admitting: Hematology and Oncology

## 2014-08-09 ENCOUNTER — Ambulatory Visit: Payer: Medicare Other | Admitting: Nutrition

## 2014-08-09 VITALS — BP 102/65 | HR 100 | Temp 98.1°F | Resp 18 | Ht 69.0 in | Wt 115.9 lb

## 2014-08-09 DIAGNOSIS — R634 Abnormal weight loss: Secondary | ICD-10-CM

## 2014-08-09 DIAGNOSIS — C7951 Secondary malignant neoplasm of bone: Secondary | ICD-10-CM

## 2014-08-09 DIAGNOSIS — C321 Malignant neoplasm of supraglottis: Secondary | ICD-10-CM

## 2014-08-09 DIAGNOSIS — D630 Anemia in neoplastic disease: Secondary | ICD-10-CM

## 2014-08-09 DIAGNOSIS — Z5111 Encounter for antineoplastic chemotherapy: Secondary | ICD-10-CM

## 2014-08-09 MED ORDER — SODIUM CHLORIDE 0.9 % IV SOLN
Freq: Once | INTRAVENOUS | Status: AC
  Administered 2014-08-09: 10:00:00 via INTRAVENOUS

## 2014-08-09 MED ORDER — FAMOTIDINE IN NACL 20-0.9 MG/50ML-% IV SOLN
INTRAVENOUS | Status: AC
  Filled 2014-08-09: qty 50

## 2014-08-09 MED ORDER — PACLITAXEL CHEMO INJECTION 300 MG/50ML
60.0000 mg/m2 | Freq: Once | INTRAVENOUS | Status: AC
  Administered 2014-08-09: 96 mg via INTRAVENOUS
  Filled 2014-08-09: qty 16

## 2014-08-09 MED ORDER — DIPHENHYDRAMINE HCL 50 MG/ML IJ SOLN
50.0000 mg | Freq: Once | INTRAMUSCULAR | Status: AC
  Administered 2014-08-09: 50 mg via INTRAVENOUS

## 2014-08-09 MED ORDER — FAMOTIDINE IN NACL 20-0.9 MG/50ML-% IV SOLN
20.0000 mg | Freq: Once | INTRAVENOUS | Status: AC
  Administered 2014-08-09: 20 mg via INTRAVENOUS

## 2014-08-09 MED ORDER — PACLITAXEL CHEMO INJECTION 300 MG/50ML: 60.0000 mg/m2 | Freq: Once | INTRAVENOUS | Status: DC

## 2014-08-09 MED ORDER — SODIUM CHLORIDE 0.9 % IJ SOLN
10.0000 mL | INTRAMUSCULAR | Status: DC | PRN
  Administered 2014-08-09: 10 mL
  Filled 2014-08-09: qty 10

## 2014-08-09 MED ORDER — HEPARIN SOD (PORK) LOCK FLUSH 100 UNIT/ML IV SOLN
500.0000 [IU] | Freq: Once | INTRAVENOUS | Status: AC | PRN
  Administered 2014-08-09: 500 [IU]
  Filled 2014-08-09: qty 5

## 2014-08-09 MED ORDER — DEXAMETHASONE SODIUM PHOSPHATE 20 MG/5ML IJ SOLN
INTRAMUSCULAR | Status: AC
  Filled 2014-08-09: qty 5

## 2014-08-09 MED ORDER — DEXAMETHASONE SODIUM PHOSPHATE 20 MG/5ML IJ SOLN
20.0000 mg | Freq: Once | INTRAMUSCULAR | Status: AC
  Administered 2014-08-09: 20 mg via INTRAVENOUS

## 2014-08-09 MED ORDER — DIPHENHYDRAMINE HCL 50 MG/ML IJ SOLN
INTRAMUSCULAR | Status: AC
  Filled 2014-08-09: qty 1

## 2014-08-09 NOTE — Progress Notes (Signed)
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     07/09/2012 Initial Diagnosis Carcinoma of supraglottis from biopsy, HPV negative   08/13/2012 Surgery He underwent laryngectomy and bilateral LN dissection. There were bilateral LN involvement and 10/25 LN were positive with extracapsular extension staging T3N2Mx   09/20/2012 - 10/29/2012 Chemotherapy he received concurrent chemo/Rt with weekly cisplatin   02/23/2013 Imaging Staging PET/Ct scan showed focal hypermetabolic bone lesions from metastatic cancer   03/09/2013 Procedure Placement of infusaport under IR   03/15/2013 - 07/19/2013 Chemotherapy Start cycle 1 palliative chemotherapy with Weekly Carboplatin & Paclitaxel with mild dosage adjustment due to elevated creatinine. Chemotherapy was subsequently discontinued as [name]   05/03/2013 Imaging Repeat PEt/CT showed no evidence of local laryngeal squamous cell carcinoma in the neck & interval resolution of metabolic activity associated skeletal metastases.    05/10/2013 Adverse Reaction Dose of Paclitxel was reduced by 20% due to worsening neuropathy   07/15/2013 Imaging Repeat PET CT scan show resolution of his disease in the bones. There is mild  inflammatory/infectious process in his lungs but the patient is not symptomatic   10/14/2013 Imaging CT scan show no evidence of disease progression.   04/14/2014 Imaging repeat CT scan show no evidence of disease progression   07/27/2014 Imaging CT scan of the neck and the chest showed no lymphadenopathy, lung nodule and bone metastasis   08/09/2014 -  Chemotherapy He received weekly Paclitaxel    INTERVAL HISTORY: Please see below for problem oriented charting. He returns today prior to the start of chemotherapy. Unfortunately, CT scan of the abdomen and pelvis along with bone scan was not scheduled. He denies dysphagia. He has pressure sensation around his lower abdomen region but is not causing pain. His daughter requires assistance with his nutritional intake. He did not lose any more weight since I saw him  REVIEW OF SYSTEMS:   Constitutional: Denies fevers, chills or abnormal weight loss Eyes: Denies blurriness of vision Ears, nose, mouth, throat, and face: Denies mucositis or sore throat Respiratory: Denies cough, dyspnea or wheezes Cardiovascular: Denies palpitation, chest discomfort or lower extremity swelling Gastrointestinal:  Denies nausea, heartburn or change in bowel habits Skin: Denies abnormal skin rashes Lymphatics: Denies new lymphadenopathy or easy bruising Neurological:Denies numbness, tingling or new weaknesses Behavioral/Psych: Mood is stable, no new changes  All other systems were reviewed with the patient and are negative.  I have reviewed the past medical history, past surgical history, social history and family history with the patient and they are unchanged from previous note.  ALLERGIES:  has No Known Allergies.  MEDICATIONS:  Current Outpatient Prescriptions  Medication Sig Dispense Refill  . acetaminophen (TYLENOL) 325 MG tablet Take 650 mg by mouth daily as needed for pain.    Marland Kitchen levothyroxine (SYNTHROID, LEVOTHROID)  75 MCG tablet Take 1 tablet (75 mcg  total) by mouth daily before breakfast. 30 tablet 6  . Multiple Vitamin (MULTIVITAMIN) tablet Take 1 tablet by mouth daily.    Marland Kitchen oxyCODONE (OXY IR/ROXICODONE) 5 MG immediate release tablet Take 1 tablet (5 mg total) by mouth every 4 (four) hours as needed for severe pain. 30 tablet 0  . polyethylene glycol (MIRALAX) packet Take 17 g by mouth daily. 14 each 0  . prochlorperazine (COMPAZINE) 10 MG tablet Take 1 tablet (10 mg total) by mouth every 6 (six) hours as needed for nausea or vomiting. 30 tablet 1   No current facility-administered medications for this visit.    PHYSICAL EXAMINATION: ECOG PERFORMANCE STATUS: 1 - Symptomatic but completely ambulatory  Filed Vitals:   08/09/14 0911  BP: 102/65  Pulse: 100  Temp: 98.1 F (36.7 C)  Resp: 18   Filed Weights   08/09/14 0911  Weight: 115 lb 14.4 oz (52.572 kg)    GENERAL:alert, no distress and comfortable. He appears thin and cachectic SKIN: skin color, texture, turgor are normal, no rashes or significant lesions EYES: normal, Conjunctiva are pink and non-injected, sclera clear OROPHARYNX:no exudate, no erythema and lips, buccal mucosa, and tongue normal  NECK: Extensive surgical scar. Tracheostomy site looks clean without signs of infection.  LYMPH:  no palpable lymphadenopathy in the cervical, axillary or inguinal LUNGS: clear to auscultation and percussion with normal breathing effort HEART: regular rate & rhythm and no murmurs and no lower extremity edema ABDOMEN:abdomen soft, non-tender and normal bowel sounds Musculoskeletal:no cyanosis of digits and no clubbing  NEURO: alert & oriented x 3 with fluent speech, no focal motor/sensory deficits  LABORATORY DATA:  I have reviewed the data as listed    Component Value Date/Time   NA 132* 07/27/2014 0952   NA 136 08/19/2012 0625   K 3.9 07/27/2014 0952   K 4.3 08/19/2012 0625   CL 98 11/15/2012 0941   CL 100 08/19/2012 0625   CO2 24 07/27/2014 0952   CO2 28 08/19/2012  0625   GLUCOSE 97 07/27/2014 0952   GLUCOSE 90 11/15/2012 0941   GLUCOSE 109* 08/19/2012 0625   BUN 14.1 07/27/2014 0952   BUN 9 08/19/2012 0625   CREATININE 1.1 07/27/2014 0952   CREATININE 0.69 08/19/2012 0625   CALCIUM 8.6 07/27/2014 0952   CALCIUM 8.3* 08/20/2012 0542   PROT 8.3 07/27/2014 0952   PROT 6.9 08/19/2012 0625   ALBUMIN 2.3* 07/27/2014 0952   ALBUMIN 2.4* 08/19/2012 0625   AST 101* 07/27/2014 0952   AST 25 08/19/2012 0625   ALT 22 07/27/2014 0952   ALT 20 08/19/2012 0625   ALKPHOS 101 07/27/2014 0952   ALKPHOS 57 08/19/2012 0625   BILITOT 0.40 07/27/2014 0952   BILITOT 0.6 08/19/2012 0625   GFRNONAA >90 08/19/2012 0625   GFRAA >90 08/19/2012 0625    No results found for: SPEP, UPEP  Lab Results  Component Value Date   WBC 8.8 08/02/2014   NEUTROABS 6.3 08/02/2014   HGB 9.6* 08/02/2014   HCT 29.3* 08/02/2014   MCV 80.7 08/02/2014   PLT 320 08/02/2014      Chemistry      Component Value Date/Time   NA 132* 07/27/2014 0952   NA 136 08/19/2012 0625   K 3.9 07/27/2014 0952   K 4.3 08/19/2012 0625   CL 98 11/15/2012 0941   CL 100 08/19/2012 0625   CO2 24 07/27/2014 0952   CO2 28 08/19/2012 5631  BUN 14.1 07/27/2014 0952   BUN 9 08/19/2012 0625   CREATININE 1.1 07/27/2014 0952   CREATININE 0.69 08/19/2012 0625      Component Value Date/Time   CALCIUM 8.6 07/27/2014 0952   CALCIUM 8.3* 08/20/2012 0542   ALKPHOS 101 07/27/2014 0952   ALKPHOS 57 08/19/2012 0625   AST 101* 07/27/2014 0952   AST 25 08/19/2012 0625   ALT 22 07/27/2014 0952   ALT 20 08/19/2012 0625   BILITOT 0.40 07/27/2014 0952   BILITOT 0.6 08/19/2012 0625       ASSESSMENT & PLAN:  Carcinoma of supraglottis From our prior visit, I recommended further staging with bone scan and CT scan of the abdomen and pelvis. Unfortunately, that was not done. We will attempt to reschedule. We will proceed with palliative chemotherapy with single agent Taxol. We discussed the role of  chemotherapy. The intent is for palliative.  We discussed some of the risks, benefits, side-effects of Taxol.   Some of the short term side-effects included, though not limited to, risk of severe allergic reaction, fatigue, weight loss, pancytopenia, life-threatening infections, need for transfusions of blood products, nausea, vomiting, change in bowel habits, loss of hair, admission to hospital for various reasons, and risks of death.   Long term side-effects are also discussed including risks of infertility, permanent damage to nerve function, chronic fatigue, and rare secondary malignancy including bone marrow disorders.   The patient is aware that the response rates discussed earlier is not guaranteed.    After a long discussion, patient made an informed decision to proceed with the prescribed plan of care and went ahead to sign the consent form today.   Patient education material was dispensed I will see him back next week to review test results.   Weight loss He has significant weight loss and protein calorie malnutrition from disease recurrence. I recommend him to increase oral intake as tolerated.  I am waiting for result of CT scan of the abdomen and pelvis for further review. I have requested dietitian to see the patient at infusion today. I will consider appetite stimulant next week when I see him back.    Orders Placed This Encounter  Procedures  . Hold Tube, Blood Bank    Standing Status: Standing     Number of Occurrences: 22     Standing Expiration Date: 08/09/2015   All questions were answered. The patient knows to call the clinic with any problems, questions or concerns. No barriers to learning was detected. I spent 25 minutes counseling the patient face to face. The total time spent in the appointment was 30 minutes and more than 50% was on counseling and review of test results     [facility], [place], MD 08/09/2014 5:34 PM

## 2014-08-09 NOTE — Progress Notes (Signed)
Ok to treat with recent labs per MD Alvy Bimler note in chemotherapy orders.

## 2014-08-09 NOTE — Telephone Encounter (Signed)
lvm for pt regarding to March appt d.t

## 2014-08-09 NOTE — Progress Notes (Signed)
69 year old male diagnosed with recurrence of laryngeal cancer.  Patient is receiving palliative chemotherapy.   Spoke with patient and daughter in chemotherapy room.  Patient has a very good appetite at breakfast and often requests a variety of different foods.  Patient continues to graze throughout the day.  He is drinking Ensure Plus or boost plus twice a day.   Weight documented as 115 pounds down about 5 pounds from 119.7 pounds 2 years ago.  Patient remains underweight with BMI of 17.11.  Patient meets criteria for severe malnutrition in the context of chronic illness secondary to severe depletion of body fat and muscle mass on physical exam and BMI of 17.11.  Nutrition diagnosis: Malnutrition related to laryngeal cancer and increased nutrient needs as evidenced by BMI of 17 and depletion of muscle and body fat.  Intervention:  Patient educated to increase Ensure Plus 4 times a day between meals.  Provided one complementary case for patient along with samples of other nutritional supplements and coupons.  Educated daughter on strategies for increasing calories and protein  Provided recipes fact sheets. Questions were answered.  Teach back method used. Contact information was given.   Monitoring, evaluation, goals: patient will increase oral intake to minimize further weight loss for improved quality-of-life.  Next visit: I will continue to assist patient as needed.  Patient's daughter has my contact information for questions or further needs.  **Disclaimer: This note was dictated with voice recognition software. Similar sounding words can inadvertently be transcribed and this note may contain transcription errors which may not have been corrected upon publication of note.**

## 2014-08-09 NOTE — Assessment & Plan Note (Signed)
He has significant weight loss and protein calorie malnutrition from disease recurrence. I recommend him to increase oral intake as tolerated.  I am waiting for result of CT scan of the abdomen and pelvis for further review. I have requested dietitian to see the patient at infusion today. I will consider appetite stimulant next week when I see him back.

## 2014-08-09 NOTE — Assessment & Plan Note (Addendum)
From our prior visit, I recommended further staging with bone scan and CT scan of the abdomen and pelvis. Unfortunately, that was not done. We will attempt to reschedule. We will proceed with palliative chemotherapy with single agent Taxol. We discussed the role of chemotherapy. The intent is for palliative.  We discussed some of the risks, benefits, side-effects of Taxol.   Some of the short term side-effects included, though not limited to, risk of severe allergic reaction, fatigue, weight loss, pancytopenia, life-threatening infections, need for transfusions of blood products, nausea, vomiting, change in bowel habits, loss of hair, admission to hospital for various reasons, and risks of death.   Long term side-effects are also discussed including risks of infertility, permanent damage to nerve function, chronic fatigue, and rare secondary malignancy including bone marrow disorders.   The patient is aware that the response rates discussed earlier is not guaranteed.    After a long discussion, patient made an informed decision to proceed with the prescribed plan of care and went ahead to sign the consent form today.   Patient education material was dispensed I will see him back next week to review test results.

## 2014-08-09 NOTE — Patient Instructions (Signed)
[facility] Discharge Instructions for Patients Receiving Chemotherapy  Today you received the following chemotherapy agents Paclitaxel.  To help prevent nausea and vomiting after your treatment, we encourage you to take your nausea medication as directed.    If you develop nausea and vomiting that is not controlled by your nausea medication, call the clinic.   BELOW ARE SYMPTOMS THAT SHOULD BE REPORTED IMMEDIATELY:  *FEVER GREATER THAN 100.5 F  *CHILLS WITH OR WITHOUT FEVER  NAUSEA AND VOMITING THAT IS NOT CONTROLLED WITH YOUR NAUSEA MEDICATION  *UNUSUAL SHORTNESS OF BREATH  *UNUSUAL BRUISING OR BLEEDING  TENDERNESS IN MOUTH AND THROAT WITH OR WITHOUT PRESENCE OF ULCERS  *URINARY PROBLEMS  *BOWEL PROBLEMS  UNUSUAL RASH Items with * indicate a potential emergency and should be followed up as soon as possible.  Feel free to call the clinic you have any questions or concerns. The clinic phone number is [phone].    

## 2014-08-14 ENCOUNTER — Encounter (HOSPITAL_COMMUNITY): Payer: Self-pay

## 2014-08-14 ENCOUNTER — Ambulatory Visit (HOSPITAL_COMMUNITY)
Admission: RE | Admit: 2014-08-14 | Discharge: 2014-08-14 | Disposition: A | Discharge status: Home / Self Care | Payer: Medicare Other | Source: Ambulatory Visit | Attending: Hematology and Oncology | Admitting: Hematology and Oncology

## 2014-08-14 DIAGNOSIS — R634 Abnormal weight loss: Secondary | ICD-10-CM | POA: Diagnosis not present

## 2014-08-14 DIAGNOSIS — C321 Malignant neoplasm of supraglottis: Secondary | ICD-10-CM | POA: Diagnosis not present

## 2014-08-14 DIAGNOSIS — C799 Secondary malignant neoplasm of unspecified site: Secondary | ICD-10-CM

## 2014-08-14 DIAGNOSIS — C801 Malignant (primary) neoplasm, unspecified: Secondary | ICD-10-CM | POA: Diagnosis not present

## 2014-08-14 MED ORDER — IOHEXOL 300 MG/ML  SOLN
100.0000 mL | Freq: Once | INTRAMUSCULAR | Status: AC | PRN
  Administered 2014-08-14: 100 mL via INTRAVENOUS

## 2014-08-16 ENCOUNTER — Other Ambulatory Visit: Payer: Self-pay | Admitting: Hematology and Oncology

## 2014-08-16 ENCOUNTER — Ambulatory Visit (HOSPITAL_BASED_OUTPATIENT_CLINIC_OR_DEPARTMENT_OTHER): Payer: Medicare Other

## 2014-08-16 ENCOUNTER — Other Ambulatory Visit (HOSPITAL_BASED_OUTPATIENT_CLINIC_OR_DEPARTMENT_OTHER): Payer: Medicare Other

## 2014-08-16 ENCOUNTER — Ambulatory Visit: Payer: Self-pay | Admitting: Hematology and Oncology

## 2014-08-16 ENCOUNTER — Ambulatory Visit: Payer: Medicare Other

## 2014-08-16 DIAGNOSIS — Z95828 Presence of other vascular implants and grafts: Secondary | ICD-10-CM

## 2014-08-16 DIAGNOSIS — C7951 Secondary malignant neoplasm of bone: Secondary | ICD-10-CM

## 2014-08-16 DIAGNOSIS — C321 Malignant neoplasm of supraglottis: Secondary | ICD-10-CM

## 2014-08-16 DIAGNOSIS — D630 Anemia in neoplastic disease: Secondary | ICD-10-CM

## 2014-08-16 DIAGNOSIS — Z5111 Encounter for antineoplastic chemotherapy: Secondary | ICD-10-CM

## 2014-08-16 LAB — COMPREHENSIVE METABOLIC PANEL (CC13)
ALT: 55 U/L (ref 0–55)
ANION GAP: 9 meq/L (ref 3–11)
AST: 99 U/L — ABNORMAL HIGH (ref 5–34)
Albumin: 2.2 g/dL — ABNORMAL LOW (ref 3.5–5.0)
Alkaline Phosphatase: 102 U/L (ref 40–150)
BUN: 16.8 mg/dL (ref 7.0–26.0)
CO2: 24 mEq/L (ref 22–29)
CREATININE: 1.1 mg/dL (ref 0.7–1.3)
Calcium: 8.4 mg/dL (ref 8.4–10.4)
Chloride: 97 mEq/L — ABNORMAL LOW (ref 98–109)
EGFR: 82 mL/min/{1.73_m2} — ABNORMAL LOW (ref >90)
Glucose: 95 mg/dl (ref 70–140)
Potassium: 4.5 mEq/L (ref 3.5–5.1)
Sodium: 129 mEq/L — ABNORMAL LOW (ref 136–145)
TOTAL PROTEIN: 8 g/dL (ref 6.4–8.3)
Total Bilirubin: 0.41 mg/dL (ref 0.20–1.20)

## 2014-08-16 LAB — CBC WITH DIFFERENTIAL/PLATELET
BASO%: 0.4 % (ref 0.0–2.0)
BASOS ABS: 0 10*3/uL (ref 0.0–0.1)
EOS%: 1.1 % (ref 0.0–7.0)
Eosinophils Absolute: 0.1 10*3/uL (ref 0.0–0.5)
HCT: 25.3 % — ABNORMAL LOW (ref 38.4–49.9)
HGB: 8.3 g/dL — ABNORMAL LOW (ref 13.0–17.1)
LYMPH%: 12.4 % — AB (ref 14.0–49.0)
MCH: 26.2 pg — ABNORMAL LOW (ref 27.2–33.4)
MCHC: 32.8 g/dL (ref 32.0–36.0)
MCV: 79.8 fL (ref 79.3–98.0)
MONO#: 0.7 10*3/uL (ref 0.1–0.9)
MONO%: 13.5 % (ref 0.0–14.0)
NEUT#: 3.9 10*3/uL (ref 1.5–6.5)
NEUT%: 72.6 % (ref 39.0–75.0)
Platelets: 311 10*3/uL (ref 140–400)
RBC: 3.17 10*6/uL — AB (ref 4.20–5.82)
RDW: 18.1 % — ABNORMAL HIGH (ref 11.0–14.6)
WBC: 5.4 10*3/uL (ref 4.0–10.3)
lymph#: 0.7 10*3/uL — ABNORMAL LOW (ref 0.9–3.3)

## 2014-08-16 LAB — HOLD TUBE, BLOOD BANK

## 2014-08-16 MED ORDER — FAMOTIDINE IN NACL 20-0.9 MG/50ML-% IV SOLN
INTRAVENOUS | Status: AC
  Filled 2014-08-16: qty 50

## 2014-08-16 MED ORDER — HEPARIN SOD (PORK) LOCK FLUSH 100 UNIT/ML IV SOLN
500.0000 [IU] | Freq: Once | INTRAVENOUS | Status: AC | PRN
  Administered 2014-08-16: 500 [IU]
  Filled 2014-08-16: qty 5

## 2014-08-16 MED ORDER — FAMOTIDINE IN NACL 20-0.9 MG/50ML-% IV SOLN
20.0000 mg | Freq: Once | INTRAVENOUS | Status: AC
  Administered 2014-08-16: 20 mg via INTRAVENOUS

## 2014-08-16 MED ORDER — SODIUM CHLORIDE 0.9 % IV SOLN
Freq: Once | INTRAVENOUS | Status: AC
  Administered 2014-08-16: 14:00:00 via INTRAVENOUS
  Filled 2014-08-16: qty 50

## 2014-08-16 MED ORDER — SODIUM CHLORIDE 0.9 % IJ SOLN
10.0000 mL | INTRAMUSCULAR | Status: DC | PRN
  Administered 2014-08-16: 10 mL
  Filled 2014-08-16: qty 10

## 2014-08-16 MED ORDER — DEXTROSE 5 % IV SOLN
60.0000 mg/m2 | Freq: Once | INTRAVENOUS | Status: AC
  Administered 2014-08-16: 96 mg via INTRAVENOUS
  Filled 2014-08-16: qty 16

## 2014-08-16 MED ORDER — SODIUM CHLORIDE 0.9 % IV SOLN
Freq: Once | INTRAVENOUS | Status: AC
  Administered 2014-08-16: 14:00:00 via INTRAVENOUS

## 2014-08-16 MED ORDER — HEPARIN SOD (PORK) LOCK FLUSH 100 UNIT/ML IV SOLN
500.0000 [IU] | Freq: Once | INTRAVENOUS | Status: AC
  Administered 2014-08-16: 500 [IU] via INTRAVENOUS
  Filled 2014-08-16: qty 5

## 2014-08-16 MED ORDER — DIPHENHYDRAMINE HCL 50 MG/ML IJ SOLN: 50.0000 mg | Freq: Once | INTRAMUSCULAR | Status: DC

## 2014-08-16 MED ORDER — SODIUM CHLORIDE 0.9 % IV SOLN: 20.0000 mg | Freq: Once | INTRAVENOUS | Status: DC

## 2014-08-16 MED ORDER — SODIUM CHLORIDE 0.9 % IJ SOLN
10.0000 mL | INTRAMUSCULAR | Status: DC | PRN
  Administered 2014-08-16: 10 mL via INTRAVENOUS
  Filled 2014-08-16: qty 10

## 2014-08-16 NOTE — Patient Instructions (Signed)

## 2014-08-16 NOTE — Patient Instructions (Signed)
[facility] Discharge Instructions for Patients Receiving Chemotherapy  Today you received the following chemotherapy agents: Taxol  To help prevent nausea and vomiting after your treatment, we encourage you to take your nausea medication as prescribed by your physician.  If you develop nausea and vomiting that is not controlled by your nausea medication, call the clinic.   BELOW ARE SYMPTOMS THAT SHOULD BE REPORTED IMMEDIATELY:  *FEVER GREATER THAN 100.5 F  *CHILLS WITH OR WITHOUT FEVER  NAUSEA AND VOMITING THAT IS NOT CONTROLLED WITH YOUR NAUSEA MEDICATION  *UNUSUAL SHORTNESS OF BREATH  *UNUSUAL BRUISING OR BLEEDING  TENDERNESS IN MOUTH AND THROAT WITH OR WITHOUT PRESENCE OF ULCERS  *URINARY PROBLEMS  *BOWEL PROBLEMS  UNUSUAL RASH Items with * indicate a potential emergency and should be followed up as soon as possible.  Feel free to call the clinic you have any questions or concerns. The clinic phone number is [phone].    

## 2014-08-16 NOTE — Progress Notes (Signed)
Daughter states patient has blood clots coming from his stoma site on his throat; stated clots appear more at night. Dr. Alvy Bimler made aware of this; no additional orders received at this time; we will continue to monitor labs. Platelets are WNL. Hgb 8.3. Per Dr. Alvy Bimler, okay to tx with hgb 8.3, no transfusion needed today; will recheck next week.

## 2014-08-17 ENCOUNTER — Encounter (HOSPITAL_COMMUNITY)
Admission: RE | Admit: 2014-08-17 | Discharge: 2014-08-17 | Disposition: A | Discharge status: Home / Self Care | Payer: Medicare Other | Source: Ambulatory Visit | Attending: Hematology and Oncology | Admitting: Hematology and Oncology

## 2014-08-17 ENCOUNTER — Telehealth: Payer: Self-pay | Admitting: *Deleted

## 2014-08-17 DIAGNOSIS — C329 Malignant neoplasm of larynx, unspecified: Secondary | ICD-10-CM | POA: Insufficient documentation

## 2014-08-17 DIAGNOSIS — C799 Secondary malignant neoplasm of unspecified site: Secondary | ICD-10-CM

## 2014-08-17 DIAGNOSIS — C321 Malignant neoplasm of supraglottis: Secondary | ICD-10-CM

## 2014-08-17 DIAGNOSIS — R634 Abnormal weight loss: Secondary | ICD-10-CM

## 2014-08-17 MED ORDER — TECHNETIUM TC 99M MEDRONATE IV KIT
25.7000 | PACK | Freq: Once | INTRAVENOUS | Status: AC | PRN
  Administered 2014-08-17: 25.7 via INTRAVENOUS

## 2014-08-17 NOTE — Telephone Encounter (Signed)
PT.'S DAUGHTER WOULD LIKE TO PICK UP A PRESCRIPTION FOR A LIGHT WEIGHT WHEEL CHAIR TODAY. PT. IS FOR A BONE SCAN 12NOON AT [facility]. THIS NOTE WAS SENT TO [facility].

## 2014-08-17 NOTE — Telephone Encounter (Signed)
done

## 2014-08-17 NOTE — Telephone Encounter (Signed)
Informed daughter of order for lightweight w/c sent to [facility] and to expect a call from them. She verbalized understanding.  Notified Stephanie at [facility] of new order.

## 2014-08-21 ENCOUNTER — Encounter (HOSPITAL_COMMUNITY): Payer: Self-pay | Admitting: Dentistry

## 2014-08-21 ENCOUNTER — Ambulatory Visit (HOSPITAL_COMMUNITY): Payer: Self-pay | Admitting: Dentistry

## 2014-08-21 ENCOUNTER — Telehealth: Payer: Self-pay | Admitting: *Deleted

## 2014-08-21 VITALS — BP 91/55 | HR 71 | Temp 98.6°F

## 2014-08-21 DIAGNOSIS — K08109 Complete loss of teeth, unspecified cause, unspecified class: Secondary | ICD-10-CM

## 2014-08-21 DIAGNOSIS — R682 Dry mouth, unspecified: Secondary | ICD-10-CM

## 2014-08-21 DIAGNOSIS — K117 Disturbances of salivary secretion: Secondary | ICD-10-CM

## 2014-08-21 DIAGNOSIS — Z972 Presence of dental prosthetic device (complete) (partial): Secondary | ICD-10-CM

## 2014-08-21 DIAGNOSIS — C321 Malignant neoplasm of supraglottis: Secondary | ICD-10-CM

## 2014-08-21 DIAGNOSIS — Z463 Encounter for fitting and adjustment of dental prosthetic device: Secondary | ICD-10-CM

## 2014-08-21 NOTE — Patient Instructions (Signed)
Instructions for Denture Use and Care  Congratulations, you are on the way to oral rehabilitation!  You have just received a new set of complete or partial dentures.  These prostheses will help to improve both your appearance and chewing ability.  These instructions will help you get adjusted to your dentures as well as care for them properly.  Please read these instructions carefully and completely as soon as you get home.  If you or your caregiver have any questions please notify the [facility] at [phone].  HOW YOUR DENTURES LOOK AND FEEL Soon after you begin wearing your dentures, you may feel that your dentures are too large or even loose.  As our mouth and facial muscles become accustomed to the dentures, these feelings will go away.  You also may feel that you are salivating more than you normally do.  This feeling should go away as you get used to having the dentures in your mouth.  You may bite your cheek or your tongue; this will eventually resolve itself as you wear your dentures.  Some soreness is to be expected, but you should not hurt.  If your mouth hurts, call your dentist.  A denture adhesive may occasionally be necessary to hold your dentures in place more securely.  The dentist will let you know when one is recommended for you.  SPEAKING Wearing dentures will change the sound of your voice initially.  This will be noticed by you more than anyone else.  Bite and swallow before you speak, in order to place your dentures in position so that you may speak more clearly.  Practice speaking by reading aloud or counting from 1 to 100 very slowly and distinctly.  After some practice your mouth will become accustomed to your dentures and you will speak more clearly.  EATING Chewing will definitely be different after you receive your dentures.  With a little practice and patience you should be able to eat just about any kind of food.  Begin by eating small quantities of food  that are cut into small pieces.  Star with soft foods such as eggs, cooked vegetables, or puddings.  As you gain confidence advance  Your diet to whatever texture foods you can tolerate.  DENTURE CARE Dentures can collect plaque and calculus much the same as natural teeth can.  If not removed on a regular basis, your dentures will not look or feel clean, and you will experience denture odor.  It is very important that you remove your dentures at bedtime and clean them thoroughly.  You should: 1. Clean your dentures over a sink full of water so if dropped, breakage will be prevented. 2. Rinse your dentures with cool water to remove any large food particles. 3. Use soap and water or a denture cleanser or paste to clean the dentures.  Do not use regular toothpaste as it may abrade the denture base or teeth. 4. Use a moistened denture brush to clean all surfaces (inside and outside). 5. Rinse thoroughly to remove any remaining soap or denture cleanser. 6. Use a soft bristle toothbrush to gently brush any natural teeth, gums, tongue, and palate at bedtime and before reinserting your dentures. 7. Do not sleep with your dentures in your mouth at night.  Remove your dentures and soak them overnight in a denture cup filled with water or denture solution as recommended by your dentist.  This routine will become second nature and will increase the life and comfort   of your dentures.  Please do not try to adjust these dentures yourself; you could damage them.  FOLLOW-UP You should call or make an appointment with your dentist.  Your dentist would like to see you at least once a year for a check-up and examination. 

## 2014-08-21 NOTE — Telephone Encounter (Signed)
NOTIFIED TAMMI HOLLAND,RN. SHE WILL REFAX THE PRESCRIPTION FOR A WHEEL CHAIR. NOTIFIED PT.'S FAMILY.

## 2014-08-21 NOTE — Telephone Encounter (Signed)
Re-faxed signed order for lightweight wheelchair along with most recent office note.

## 2014-08-21 NOTE — Progress Notes (Signed)
08/21/2014  Patient:            Cody [name] Date of Birth:  1945-07-29 MRN:                [id number]  BP 91/55 mmHg  Pulse 71  Temp(Src) 98.6 F (37 C) (Oral)   [name] presents for evaluation of upper and lower complete dentures.  Patient has had multiple previous broken appointments prior to this denture adjustment.  SUBJECTIVE: Patient is complaining of slight denture irritation to the upper and lower anterior areas.  OBJECTIVE: There are 1 x 1 mm areas of exposed bony spicules in the area of #18 on the lingual and #25 on the alveolar ridge. There is no evidence of denture irritation or erythema. Xerostomia is noted.   Procedure: Topical anesthetic applied to the lower left lingual and mandibular anterior areas. 1 x 1 mm bony spicules were removed with a rongeur and curette with no complications. Pressure indicating paste applied to dentures. Adjustments made as needed. [place] Occlusion evaluated and no adjustments were needed for Centric Relation and protrusive strokes. Patient still with tendency to protrude to end to end position and left lateral protrusion. Patient was instructed again on finding maximum intercuspation position. Patient accepts results.  Patient to use salt water rinses as needed to aid healing. Keep dentures out if needed. Return to clinic as scheduled for denture adjustment.   Call if problems arise before then. Patient dismissed in stable condition.  Lenn Cal, DDS

## 2014-08-22 ENCOUNTER — Other Ambulatory Visit: Payer: Self-pay | Admitting: Hematology and Oncology

## 2014-08-23 ENCOUNTER — Ambulatory Visit (HOSPITAL_BASED_OUTPATIENT_CLINIC_OR_DEPARTMENT_OTHER): Payer: Medicare Other

## 2014-08-23 ENCOUNTER — Encounter: Payer: Self-pay | Admitting: Hematology and Oncology

## 2014-08-23 ENCOUNTER — Ambulatory Visit (HOSPITAL_COMMUNITY)
Admission: RE | Admit: 2014-08-23 | Discharge: 2014-08-23 | Disposition: A | Discharge status: Home / Self Care | Payer: Medicare Other | Source: Ambulatory Visit | Attending: Hematology and Oncology | Admitting: Hematology and Oncology

## 2014-08-23 ENCOUNTER — Ambulatory Visit: Payer: Medicare Other

## 2014-08-23 ENCOUNTER — Ambulatory Visit (HOSPITAL_BASED_OUTPATIENT_CLINIC_OR_DEPARTMENT_OTHER): Payer: Medicare Other | Admitting: Hematology and Oncology

## 2014-08-23 ENCOUNTER — Other Ambulatory Visit (HOSPITAL_BASED_OUTPATIENT_CLINIC_OR_DEPARTMENT_OTHER): Payer: Medicare Other

## 2014-08-23 VITALS — BP 100/56 | HR 86 | Temp 98.3°F | Resp 18 | Ht 69.0 in | Wt 115.0 lb

## 2014-08-23 DIAGNOSIS — C321 Malignant neoplasm of supraglottis: Secondary | ICD-10-CM | POA: Diagnosis present

## 2014-08-23 DIAGNOSIS — Z95828 Presence of other vascular implants and grafts: Secondary | ICD-10-CM

## 2014-08-23 DIAGNOSIS — D630 Anemia in neoplastic disease: Secondary | ICD-10-CM

## 2014-08-23 DIAGNOSIS — K5909 Other constipation: Secondary | ICD-10-CM

## 2014-08-23 DIAGNOSIS — Z5111 Encounter for antineoplastic chemotherapy: Secondary | ICD-10-CM

## 2014-08-23 DIAGNOSIS — G620 Drug-induced polyneuropathy: Secondary | ICD-10-CM

## 2014-08-23 LAB — COMPREHENSIVE METABOLIC PANEL (CC13)
ALT: 56 U/L — AB (ref 0–55)
AST: 78 U/L — AB (ref 5–34)
Albumin: 2.1 g/dL — ABNORMAL LOW (ref 3.5–5.0)
Alkaline Phosphatase: 103 U/L (ref 40–150)
Anion Gap: 6 mEq/L (ref 3–11)
BUN: 13.6 mg/dL (ref 7.0–26.0)
CO2: 23 mEq/L (ref 22–29)
CREATININE: 0.9 mg/dL (ref 0.7–1.3)
Calcium: 8.2 mg/dL — ABNORMAL LOW (ref 8.4–10.4)
Chloride: 97 mEq/L — ABNORMAL LOW (ref 98–109)
EGFR: >90 mL/min/{1.73_m2} (ref >90)
Glucose: 90 mg/dl (ref 70–140)
POTASSIUM: 4.4 meq/L (ref 3.5–5.1)
Sodium: 127 mEq/L — ABNORMAL LOW (ref 136–145)
Total Bilirubin: 0.48 mg/dL (ref 0.20–1.20)
Total Protein: 7.6 g/dL (ref 6.4–8.3)

## 2014-08-23 LAB — CBC WITH DIFFERENTIAL/PLATELET
BASO%: 0.2 % (ref 0.0–2.0)
BASOS ABS: 0 10*3/uL (ref 0.0–0.1)
EOS%: 1.3 % (ref 0.0–7.0)
Eosinophils Absolute: 0.1 10*3/uL (ref 0.0–0.5)
HEMATOCRIT: 23.4 % — AB (ref 38.4–49.9)
HEMOGLOBIN: 7.6 g/dL — AB (ref 13.0–17.1)
LYMPH#: 0.7 10*3/uL — AB (ref 0.9–3.3)
LYMPH%: 14.5 % (ref 14.0–49.0)
MCH: 26 pg — AB (ref 27.2–33.4)
MCHC: 32.5 g/dL (ref 32.0–36.0)
MCV: 80.1 fL (ref 79.3–98.0)
MONO#: 0.8 10*3/uL (ref 0.1–0.9)
MONO%: 17.4 % — ABNORMAL HIGH (ref 0.0–14.0)
NEUT#: 3 10*3/uL (ref 1.5–6.5)
NEUT%: 66.6 % (ref 39.0–75.0)
PLATELETS: 305 10*3/uL (ref 140–400)
RBC: 2.92 10*6/uL — ABNORMAL LOW (ref 4.20–5.82)
RDW: 18.7 % — ABNORMAL HIGH (ref 11.0–14.6)
WBC: 4.5 10*3/uL (ref 4.0–10.3)

## 2014-08-23 LAB — HOLD TUBE, BLOOD BANK

## 2014-08-23 LAB — PREPARE RBC (CROSSMATCH)

## 2014-08-23 MED ORDER — DIPHENHYDRAMINE HCL 50 MG/ML IJ SOLN
50.0000 mg | Freq: Once | INTRAMUSCULAR | Status: AC
  Administered 2014-08-23: 50 mg via INTRAVENOUS

## 2014-08-23 MED ORDER — SODIUM CHLORIDE 0.9 % IJ SOLN
10.0000 mL | INTRAMUSCULAR | Status: DC | PRN
  Administered 2014-08-23: 10 mL via INTRAVENOUS
  Filled 2014-08-23: qty 10

## 2014-08-23 MED ORDER — SODIUM CHLORIDE 0.9 % IV SOLN
Freq: Once | INTRAVENOUS | Status: AC
  Administered 2014-08-23: 14:00:00 via INTRAVENOUS

## 2014-08-23 MED ORDER — PACLITAXEL CHEMO INJECTION 300 MG/50ML
60.0000 mg/m2 | Freq: Once | INTRAVENOUS | Status: AC
  Administered 2014-08-23: 96 mg via INTRAVENOUS
  Filled 2014-08-23: qty 16

## 2014-08-23 MED ORDER — SODIUM CHLORIDE 0.9 % IV SOLN
20.0000 mg | Freq: Once | INTRAVENOUS | Status: AC
  Administered 2014-08-23: 20 mg via INTRAVENOUS
  Filled 2014-08-23: qty 2

## 2014-08-23 MED ORDER — DIPHENHYDRAMINE HCL 50 MG/ML IJ SOLN
INTRAMUSCULAR | Status: AC
  Filled 2014-08-23: qty 1

## 2014-08-23 MED ORDER — FAMOTIDINE IN NACL 20-0.9 MG/50ML-% IV SOLN
20.0000 mg | Freq: Once | INTRAVENOUS | Status: AC
  Administered 2014-08-23: 20 mg via INTRAVENOUS

## 2014-08-23 MED ORDER — SODIUM CHLORIDE 0.9 % IV SOLN: Freq: Once | INTRAVENOUS | Status: DC

## 2014-08-23 MED ORDER — FAMOTIDINE IN NACL 20-0.9 MG/50ML-% IV SOLN
INTRAVENOUS | Status: AC
  Filled 2014-08-23: qty 50

## 2014-08-23 MED ORDER — HEPARIN SOD (PORK) LOCK FLUSH 100 UNIT/ML IV SOLN
500.0000 [IU] | Freq: Once | INTRAVENOUS | Status: AC | PRN
  Administered 2014-08-23: 500 [IU]
  Filled 2014-08-23: qty 5

## 2014-08-23 MED ORDER — SODIUM CHLORIDE 0.9 % IJ SOLN
10.0000 mL | INTRAMUSCULAR | Status: DC | PRN
  Administered 2014-08-23: 10 mL
  Filled 2014-08-23: qty 10

## 2014-08-23 NOTE — Progress Notes (Signed)
Per Dr.Gorsuch/Tammy RN proceed with treatment. MD aware of abnormal labs.

## 2014-08-23 NOTE — Patient Instructions (Signed)

## 2014-08-23 NOTE — Patient Instructions (Signed)
[facility] Discharge Instructions for Patients Receiving Chemotherapy  Today you received the following chemotherapy agents: Taxol.   To help prevent nausea and vomiting after your treatment, we encourage you to take your nausea medication: Compazine. Take one every six hours as needed.   If you develop nausea and vomiting that is not controlled by your nausea medication, call the clinic.   BELOW ARE SYMPTOMS THAT SHOULD BE REPORTED IMMEDIATELY:  *FEVER GREATER THAN 100.5 F  *CHILLS WITH OR WITHOUT FEVER  NAUSEA AND VOMITING THAT IS NOT CONTROLLED WITH YOUR NAUSEA MEDICATION  *UNUSUAL SHORTNESS OF BREATH  *UNUSUAL BRUISING OR BLEEDING  TENDERNESS IN MOUTH AND THROAT WITH OR WITHOUT PRESENCE OF ULCERS  *URINARY PROBLEMS  *BOWEL PROBLEMS  UNUSUAL RASH Items with * indicate a potential emergency and should be followed up as soon as possible.  Feel free to call the clinic should you have any questions or concerns. The clinic phone number is [phone].  Please show the [facility] at check in to the Emergency Department and triage nurse.

## 2014-08-24 ENCOUNTER — Ambulatory Visit (HOSPITAL_BASED_OUTPATIENT_CLINIC_OR_DEPARTMENT_OTHER): Payer: Medicare Other

## 2014-08-24 ENCOUNTER — Telehealth: Payer: Self-pay | Admitting: Hematology and Oncology

## 2014-08-24 ENCOUNTER — Telehealth: Payer: Self-pay | Admitting: *Deleted

## 2014-08-24 VITALS — BP 123/62 | HR 78 | Temp 98.7°F | Resp 18

## 2014-08-24 DIAGNOSIS — C321 Malignant neoplasm of supraglottis: Secondary | ICD-10-CM | POA: Diagnosis not present

## 2014-08-24 DIAGNOSIS — D630 Anemia in neoplastic disease: Secondary | ICD-10-CM

## 2014-08-24 MED ORDER — SODIUM CHLORIDE 0.9 % IV SOLN
250.0000 mL | Freq: Once | INTRAVENOUS | Status: AC
  Administered 2014-08-24: 250 mL via INTRAVENOUS

## 2014-08-24 MED ORDER — HEPARIN SOD (PORK) LOCK FLUSH 100 UNIT/ML IV SOLN
500.0000 [IU] | Freq: Every day | INTRAVENOUS | Status: AC | PRN
  Administered 2014-08-24: 500 [IU]
  Filled 2014-08-24: qty 5

## 2014-08-24 MED ORDER — SODIUM CHLORIDE 0.9 % IJ SOLN
10.0000 mL | INTRAMUSCULAR | Status: AC | PRN
  Administered 2014-08-24: 10 mL
  Filled 2014-08-24: qty 10

## 2014-08-24 NOTE — Telephone Encounter (Signed)
Per staff message and POF I have scheduled appts. Advised scheduler of appts and to move lab/flush JMW  

## 2014-08-24 NOTE — Patient Instructions (Signed)

## 2014-08-24 NOTE — Assessment & Plan Note (Signed)
We discussed some of the risks, benefits, and alternatives of blood transfusions. The patient is symptomatic from anemia and the hemoglobin level is critically low.  Some of the side-effects to be expected including risks of transfusion reactions, chills, infection, syndrome of volume overload and risk of hospitalization from various reasons and the patient is willing to proceed and went ahead to sign consent today.  

## 2014-08-24 NOTE — Assessment & Plan Note (Signed)
Imaging study did not suggest cord compression or compromise. CT scan showed diffuse lymphadenopathy. We will continue chemotherapy. In the meantime, he will continue laxative therapy.

## 2014-08-24 NOTE — Telephone Encounter (Signed)
per pof ot sch pt appt-cld * left message & d=gave pt time 7 dte of appt

## 2014-08-24 NOTE — Assessment & Plan Note (Signed)
He tolerated treatment well up off from severe anemia, which is not unexpected given significant bone marrow disease.. I recommend modified dose at 3 weeks on 1 week off. We will proceed with blood transfusion as well. He will get a week off after this week cycle. I recommend minimum 6 doses of treatment before restaging scans.

## 2014-08-24 NOTE — Telephone Encounter (Signed)
per pof tos ch pt appt-sent MW email to sch trmt-will call pt after reply °

## 2014-08-24 NOTE — Assessment & Plan Note (Signed)
He had grade 2 neuropathy. We will continue only single agent paclitaxel in the future.

## 2014-08-24 NOTE — Progress Notes (Signed)
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     07/09/2012 Initial Diagnosis Carcinoma of supraglottis from biopsy, HPV negative   08/13/2012 Surgery He underwent laryngectomy and bilateral LN dissection. There were bilateral LN involvement and 10/25 LN were positive with extracapsular extension staging T3N2Mx   09/20/2012 - 10/29/2012 Chemotherapy he received concurrent chemo/Rt with weekly cisplatin   02/23/2013 Imaging Staging PET/Ct scan showed focal hypermetabolic bone lesions from metastatic cancer   03/09/2013 Procedure Placement of infusaport under IR   03/15/2013 - 07/19/2013 Chemotherapy Start cycle 1 palliative chemotherapy with Weekly Carboplatin & Paclitaxel with mild dosage adjustment due to elevated creatinine. Chemotherapy was subsequently discontinued as [name]   05/03/2013 Imaging Repeat PEt/CT showed no evidence of local laryngeal squamous cell carcinoma in the neck & interval resolution of metabolic activity associated skeletal metastases.    05/10/2013 Adverse Reaction Dose of Paclitxel was reduced by 20% due to worsening neuropathy   07/15/2013 Imaging Repeat PET CT scan show resolution of his disease in the bones. There is mild  inflammatory/infectious process in his lungs but the patient is not symptomatic   10/14/2013 Imaging CT scan show no evidence of disease progression.   04/14/2014 Imaging repeat CT scan show no evidence of disease progression   07/27/2014 Imaging CT scan of the neck and the chest showed no lymphadenopathy, lung nodule and bone metastasis   08/09/2014 -  Chemotherapy He received weekly Paclitaxel    INTERVAL HISTORY: Please see below for problem oriented charting. He is seen prior to chemotherapy today and to review scans. His symptoms are about the same. He continues to have persistent peripheral neuropathy but not worse. His appetite is stable. He has mild pain but declined narcotic pain medication. He is able to have regular bowel movement with laxatives.  REVIEW OF SYSTEMS:   Constitutional: Denies fevers, chills or abnormal weight loss Eyes: Denies blurriness of vision Ears, nose, mouth, throat, and face: Denies mucositis or sore throat Respiratory: Denies cough, dyspnea or wheezes Cardiovascular: Denies palpitation, chest discomfort or lower extremity swelling Skin: Denies abnormal skin rashes Lymphatics: Denies new lymphadenopathy or easy bruising Neurological:Denies numbness, tingling or new weaknesses Behavioral/Psych: Mood is stable, no new changes  All other systems were reviewed with the patient and are negative.  I have reviewed the past medical history, past surgical history, social history and family history with the patient and they are unchanged from previous note.  ALLERGIES:  has No Known Allergies.  MEDICATIONS:  Current Outpatient Prescriptions  Medication Sig Dispense Refill  . acetaminophen (TYLENOL) 325 MG tablet Take 650 mg by mouth daily as needed for pain.    Marland Kitchen levothyroxine (SYNTHROID, LEVOTHROID) 75 MCG tablet Take 1 tablet (75 mcg total) by mouth daily before breakfast. 30 tablet 6  . Multiple  Vitamin (MULTIVITAMIN) tablet Take 1 tablet by mouth daily.     Marland Kitchen oxyCODONE (OXY IR/ROXICODONE) 5 MG immediate release tablet Take 1 tablet (5 mg total) by mouth every 4 (four) hours as needed for severe pain. 30 tablet 0  . polyethylene glycol (MIRALAX) packet Take 17 g by mouth daily. 14 each 0  . prochlorperazine (COMPAZINE) 10 MG tablet Take 1 tablet (10 mg total) by mouth every 6 (six) hours as needed for nausea or vomiting. 30 tablet 1   No current facility-administered medications for this visit.    PHYSICAL EXAMINATION: ECOG PERFORMANCE STATUS: 1 - Symptomatic but completely ambulatory  Filed Vitals:   08/23/14 1310  BP: 100/56  Pulse: 86  Temp: 98.3 F (36.8 C)  Resp: 18   Filed Weights   08/23/14 1310  Weight: 115 lb (52.164 kg)    GENERAL:alert, no distress and comfortable SKIN: skin color, texture, turgor are normal, no rashes or significant lesions EYES: normal, Conjunctiva are pale and non-injected, sclera clear OROPHARYNX:no exudate, no erythema and lips, buccal mucosa, and tongue normal  NECK: tracheostomy site looks okay.  LYMPH:  no palpable lymphadenopathy in the cervical, axillary or inguinal LUNGS: clear to auscultation and percussion with normal breathing effort HEART: regular rate & rhythm and no murmurs and no lower extremity edema ABDOMEN:abdomen soft, non-tender and normal bowel sounds Musculoskeletal:no cyanosis of digits and no clubbing  NEURO: alert & oriented x 3 with fluent speech, no focal motor/sensory deficits  LABORATORY DATA:  I have reviewed the data as listed    Component Value Date/Time   NA 127* 08/23/2014 1257   NA 136 08/19/2012 0625   K 4.4 08/23/2014 1257   K 4.3 08/19/2012 0625   CL 98 11/15/2012 0941   CL 100 08/19/2012 0625   CO2 23 08/23/2014 1257   CO2 28 08/19/2012 0625   GLUCOSE 90 08/23/2014 1257   GLUCOSE 90 11/15/2012 0941   GLUCOSE 109* 08/19/2012 0625   BUN 13.6 08/23/2014 1257   BUN 9 08/19/2012 0625   CREATININE 0.9 08/23/2014 1257   CREATININE 0.69 08/19/2012 0625    CALCIUM 8.2* 08/23/2014 1257   CALCIUM 8.3* 08/20/2012 0542   PROT 7.6 08/23/2014 1257   PROT 6.9 08/19/2012 0625   ALBUMIN 2.1* 08/23/2014 1257   ALBUMIN 2.4* 08/19/2012 0625   AST 78* 08/23/2014 1257   AST 25 08/19/2012 0625   ALT 56* 08/23/2014 1257   ALT 20 08/19/2012 0625   ALKPHOS 103 08/23/2014 1257   ALKPHOS 57 08/19/2012 0625   BILITOT 0.48 08/23/2014 1257   BILITOT 0.6 08/19/2012 0625   GFRNONAA >90 08/19/2012 0625   GFRAA >90 08/19/2012 0625    No results found for: SPEP, UPEP  Lab Results  Component Value Date   WBC 4.5 08/23/2014   NEUTROABS 3.0 08/23/2014   HGB 7.6* 08/23/2014   HCT 23.4* 08/23/2014   MCV 80.1 08/23/2014   PLT 305 08/23/2014      Chemistry      Component Value Date/Time   NA 127* 08/23/2014 1257   NA 136 08/19/2012 0625   K 4.4 08/23/2014 1257   K 4.3 08/19/2012 0625   CL 98 11/15/2012 0941   CL 100 08/19/2012 0625   CO2 23 08/23/2014 1257   CO2 28 08/19/2012 0625   BUN 13.6 08/23/2014 1257   BUN 9 08/19/2012 0625   CREATININE 0.9 08/23/2014 1257   CREATININE 0.69 08/19/2012 0625      Component Value Date/Time   CALCIUM  8.2* 08/23/2014 1257   CALCIUM 8.3* 08/20/2012 0542   ALKPHOS 103 08/23/2014 1257   ALKPHOS 57 08/19/2012 0625   AST 78* 08/23/2014 1257   AST 25 08/19/2012 0625   ALT 56* 08/23/2014 1257   ALT 20 08/19/2012 0625   BILITOT 0.48 08/23/2014 1257   BILITOT 0.6 08/19/2012 0625       RADIOGRAPHIC STUDIES: I reviewed the imaging study with him and his daughter I have personally reviewed the radiological images as listed and agreed with the findings in the report.   ASSESSMENT & PLAN:  Carcinoma of supraglottis He tolerated treatment well up off from severe anemia, which is not unexpected given significant bone marrow disease.. I recommend modified dose at 3 weeks on 1 week off. We will proceed with blood transfusion as well. He will get a week off after this week cycle. I recommend minimum 6 doses of  treatment before restaging scans.   Anemia in neoplastic disease We discussed some of the risks, benefits, and alternatives of blood transfusions. The patient is symptomatic from anemia and the hemoglobin level is critically low.  Some of the side-effects to be expected including risks of transfusion reactions, chills, infection, syndrome of volume overload and risk of hospitalization from various reasons and the patient is willing to proceed and went ahead to sign consent today.    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 We will continue only single agent paclitaxel in the future.   Other constipation Imaging study did not suggest cord compression or compromise. CT scan showed diffuse lymphadenopathy. We will continue chemotherapy. In the meantime, he will continue laxative therapy.      No orders of the defined types were placed in this encounter.   All questions were answered. The patient knows to call the clinic with any problems, questions or concerns. No barriers to learning was detected. I spent 30 minutes counseling the patient face to face. The total time spent in the appointment was 40 minutes and more than 50% was on counseling and review of test results     [facility], [place], MD 08/24/2014 12:39 PM

## 2014-08-25 LAB — TYPE AND SCREEN
ABO/RH(D): B NEG
Antibody Screen: NEGATIVE
Unit division: 0

## 2014-08-29 ENCOUNTER — Encounter (HOSPITAL_COMMUNITY): Payer: Self-pay | Admitting: Dentistry

## 2014-09-05 ENCOUNTER — Ambulatory Visit (HOSPITAL_COMMUNITY): Payer: Self-pay | Admitting: Dentistry

## 2014-09-05 ENCOUNTER — Encounter (HOSPITAL_COMMUNITY): Payer: Self-pay | Admitting: Dentistry

## 2014-09-05 VITALS — BP 112/54 | HR 71 | Temp 97.5°F

## 2014-09-05 DIAGNOSIS — C321 Malignant neoplasm of supraglottis: Secondary | ICD-10-CM

## 2014-09-05 DIAGNOSIS — K08109 Complete loss of teeth, unspecified cause, unspecified class: Secondary | ICD-10-CM

## 2014-09-05 DIAGNOSIS — Z972 Presence of dental prosthetic device (complete) (partial): Secondary | ICD-10-CM

## 2014-09-05 DIAGNOSIS — R682 Dry mouth, unspecified: Secondary | ICD-10-CM

## 2014-09-05 DIAGNOSIS — K117 Disturbances of salivary secretion: Secondary | ICD-10-CM

## 2014-09-05 DIAGNOSIS — Z463 Encounter for fitting and adjustment of dental prosthetic device: Secondary | ICD-10-CM

## 2014-09-05 NOTE — Progress Notes (Signed)
09/05/2014  Patient:            Cody [name] Date of Birth:  1945/08/27 MRN:                [id number]  BP 112/54 mmHg  Pulse 71  Temp(Src) 97.5 F (36.4 C) (Oral)  HOYT LEANOS presents for evaluation of upper and lower complete dentures.   SUBJECTIVE: Patient is not complaining of any denture irritation. Previous areas that were bothering him are no longer a problem. OBJECTIVE: There is no evidence of exposed bone involving lower left lingual and mandibular anterior alveolar ridge areas. There is no evidence of denture irritation or erythema.   Procedure: Pressure indicating paste applied to dentures. Minimal adjustments made as needed. [place] Occlusion evaluated and no adjustments were needed for Centric Relation and protrusive strokes. Patient still with tendency to protrude to end to end position and left lateral protrusion. Patient was instructed again on finding maximum intercuspation position. Patient accepts results.  Patient to use salt water rinses as needed to aid healing. Keep dentures out if needed. Return to clinic as scheduled for denture adjustment.   Call if problems arise before then. Patient dismissed in stable condition.  Lenn Cal, DDS

## 2014-09-05 NOTE — Patient Instructions (Signed)
Return to clinic as scheduled for evaluation of upper lower complete dentures. Call if problems arise before then. Dr. Enrique Sack

## 2014-09-06 ENCOUNTER — Encounter: Payer: Self-pay | Admitting: Hematology and Oncology

## 2014-09-06 ENCOUNTER — Telehealth: Payer: Self-pay | Admitting: Hematology and Oncology

## 2014-09-06 ENCOUNTER — Other Ambulatory Visit: Payer: Self-pay

## 2014-09-06 ENCOUNTER — Ambulatory Visit: Payer: Medicare Other

## 2014-09-06 ENCOUNTER — Other Ambulatory Visit (HOSPITAL_BASED_OUTPATIENT_CLINIC_OR_DEPARTMENT_OTHER): Payer: Medicare Other

## 2014-09-06 ENCOUNTER — Ambulatory Visit (HOSPITAL_BASED_OUTPATIENT_CLINIC_OR_DEPARTMENT_OTHER): Payer: Medicare Other | Admitting: Hematology and Oncology

## 2014-09-06 ENCOUNTER — Ambulatory Visit (HOSPITAL_BASED_OUTPATIENT_CLINIC_OR_DEPARTMENT_OTHER): Payer: Medicare Other

## 2014-09-06 VITALS — BP 90/59 | HR 85 | Temp 98.0°F | Resp 18 | Ht 69.0 in | Wt 114.1 lb

## 2014-09-06 DIAGNOSIS — D630 Anemia in neoplastic disease: Secondary | ICD-10-CM

## 2014-09-06 DIAGNOSIS — C7951 Secondary malignant neoplasm of bone: Secondary | ICD-10-CM

## 2014-09-06 DIAGNOSIS — Z95828 Presence of other vascular implants and grafts: Secondary | ICD-10-CM

## 2014-09-06 DIAGNOSIS — C321 Malignant neoplasm of supraglottis: Secondary | ICD-10-CM

## 2014-09-06 DIAGNOSIS — Z5111 Encounter for antineoplastic chemotherapy: Secondary | ICD-10-CM

## 2014-09-06 DIAGNOSIS — I9589 Other hypotension: Secondary | ICD-10-CM

## 2014-09-06 DIAGNOSIS — E871 Hypo-osmolality and hyponatremia: Secondary | ICD-10-CM

## 2014-09-06 DIAGNOSIS — T451X5A Adverse effect of antineoplastic and immunosuppressive drugs, initial encounter: Secondary | ICD-10-CM

## 2014-09-06 DIAGNOSIS — K5909 Other constipation: Secondary | ICD-10-CM

## 2014-09-06 DIAGNOSIS — G620 Drug-induced polyneuropathy: Secondary | ICD-10-CM | POA: Diagnosis not present

## 2014-09-06 DIAGNOSIS — E46 Unspecified protein-calorie malnutrition: Secondary | ICD-10-CM | POA: Insufficient documentation

## 2014-09-06 DIAGNOSIS — R112 Nausea with vomiting, unspecified: Secondary | ICD-10-CM | POA: Insufficient documentation

## 2014-09-06 HISTORY — DX: Nausea with vomiting, unspecified: R11.2

## 2014-09-06 LAB — CBC WITH DIFFERENTIAL/PLATELET
BASO%: 1 % (ref 0.0–2.0)
BASOS ABS: 0.1 10*3/uL (ref 0.0–0.1)
EOS%: 2.5 % (ref 0.0–7.0)
Eosinophils Absolute: 0.2 10*3/uL (ref 0.0–0.5)
HEMATOCRIT: 27.4 % — AB (ref 38.4–49.9)
HGB: 8.9 g/dL — ABNORMAL LOW (ref 13.0–17.1)
LYMPH%: 10.9 % — ABNORMAL LOW (ref 14.0–49.0)
MCH: 26.6 pg — ABNORMAL LOW (ref 27.2–33.4)
MCHC: 32.5 g/dL (ref 32.0–36.0)
MCV: 81.8 fL (ref 79.3–98.0)
MONO#: 1.1 10*3/uL — ABNORMAL HIGH (ref 0.1–0.9)
MONO%: 16.3 % — AB (ref 0.0–14.0)
NEUT#: 4.7 10*3/uL (ref 1.5–6.5)
NEUT%: 69.3 % (ref 39.0–75.0)
Platelets: 349 10*3/uL (ref 140–400)
RBC: 3.35 10*6/uL — AB (ref 4.20–5.82)
RDW: 18.5 % — ABNORMAL HIGH (ref 11.0–14.6)
WBC: 6.8 10*3/uL (ref 4.0–10.3)
lymph#: 0.7 10*3/uL — ABNORMAL LOW (ref 0.9–3.3)

## 2014-09-06 LAB — COMPREHENSIVE METABOLIC PANEL (CC13)
ALBUMIN: 2.3 g/dL — AB (ref 3.5–5.0)
ALK PHOS: 108 U/L (ref 40–150)
ALT: 30 U/L (ref 0–55)
AST: 54 U/L — ABNORMAL HIGH (ref 5–34)
Anion Gap: 12 mEq/L — ABNORMAL HIGH (ref 3–11)
BILIRUBIN TOTAL: 0.56 mg/dL (ref 0.20–1.20)
BUN: 10.4 mg/dL (ref 7.0–26.0)
CO2: 23 meq/L (ref 22–29)
Calcium: 8.4 mg/dL (ref 8.4–10.4)
Chloride: 98 mEq/L (ref 98–109)
Creatinine: 0.9 mg/dL (ref 0.7–1.3)
GLUCOSE: 96 mg/dL (ref 70–140)
Potassium: 4.2 mEq/L (ref 3.5–5.1)
Sodium: 132 mEq/L — ABNORMAL LOW (ref 136–145)
Total Protein: 7.7 g/dL (ref 6.4–8.3)

## 2014-09-06 LAB — HOLD TUBE, BLOOD BANK

## 2014-09-06 MED ORDER — HEPARIN SOD (PORK) LOCK FLUSH 100 UNIT/ML IV SOLN
500.0000 [IU] | Freq: Once | INTRAVENOUS | Status: AC | PRN
  Administered 2014-09-06: 500 [IU]
  Filled 2014-09-06: qty 5

## 2014-09-06 MED ORDER — DIPHENHYDRAMINE HCL 50 MG/ML IJ SOLN
50.0000 mg | Freq: Once | INTRAMUSCULAR | Status: AC
  Administered 2014-09-06: 50 mg via INTRAVENOUS

## 2014-09-06 MED ORDER — FAMOTIDINE IN NACL 20-0.9 MG/50ML-% IV SOLN
20.0000 mg | Freq: Once | INTRAVENOUS | Status: AC
  Administered 2014-09-06: 20 mg via INTRAVENOUS

## 2014-09-06 MED ORDER — ONDANSETRON HCL 8 MG PO TABS: 8.0000 mg | ORAL_TABLET | Freq: Three times a day (TID) | ORAL | Status: AC | PRN

## 2014-09-06 MED ORDER — SODIUM CHLORIDE 0.9 % IJ SOLN
10.0000 mL | INTRAMUSCULAR | Status: DC | PRN
  Administered 2014-09-06: 10 mL
  Filled 2014-09-06: qty 10

## 2014-09-06 MED ORDER — PACLITAXEL CHEMO INJECTION 300 MG/50ML
60.0000 mg/m2 | Freq: Once | INTRAVENOUS | Status: AC
  Administered 2014-09-06: 96 mg via INTRAVENOUS
  Filled 2014-09-06: qty 16

## 2014-09-06 MED ORDER — FAMOTIDINE IN NACL 20-0.9 MG/50ML-% IV SOLN
INTRAVENOUS | Status: AC
  Filled 2014-09-06: qty 50

## 2014-09-06 MED ORDER — SODIUM CHLORIDE 0.9 % IV SOLN
20.0000 mg | Freq: Once | INTRAVENOUS | Status: AC
  Administered 2014-09-06: 20 mg via INTRAVENOUS
  Filled 2014-09-06: qty 2

## 2014-09-06 MED ORDER — PROCHLORPERAZINE MALEATE 10 MG PO TABS: 10.0000 mg | ORAL_TABLET | Freq: Four times a day (QID) | ORAL | Status: AC | PRN

## 2014-09-06 MED ORDER — SODIUM CHLORIDE 0.9 % IV SOLN
Freq: Once | INTRAVENOUS | Status: AC
  Administered 2014-09-06: 13:00:00 via INTRAVENOUS

## 2014-09-06 MED ORDER — SODIUM CHLORIDE 0.9 % IJ SOLN
10.0000 mL | INTRAMUSCULAR | Status: DC | PRN
  Administered 2014-09-06: 10 mL via INTRAVENOUS
  Filled 2014-09-06: qty 10

## 2014-09-06 MED ORDER — DIPHENHYDRAMINE HCL 50 MG/ML IJ SOLN
INTRAMUSCULAR | Status: AC
  Filled 2014-09-06: qty 1

## 2014-09-06 NOTE — Patient Instructions (Signed)
[facility] Discharge Instructions for Patients Receiving Chemotherapy  Today you received the following chemotherapy agents Paclitaxel.   To help prevent nausea and vomiting after your treatment, we encourage you to take your nausea medication as directed.    If you develop nausea and vomiting that is not controlled by your nausea medication, call the clinic.   BELOW ARE SYMPTOMS THAT SHOULD BE REPORTED IMMEDIATELY:  *FEVER GREATER THAN 100.5 F  *CHILLS WITH OR WITHOUT FEVER  NAUSEA AND VOMITING THAT IS NOT CONTROLLED WITH YOUR NAUSEA MEDICATION  *UNUSUAL SHORTNESS OF BREATH  *UNUSUAL BRUISING OR BLEEDING  TENDERNESS IN MOUTH AND THROAT WITH OR WITHOUT PRESENCE OF ULCERS  *URINARY PROBLEMS  *BOWEL PROBLEMS  UNUSUAL RASH Items with * indicate a potential emergency and should be followed up as soon as possible.  Feel free to call the clinic you have any questions or concerns. The clinic phone number is [phone].  Please show the CHEMO ALERT CARD at check-in to the Emergency Department and triage nurse.   

## 2014-09-06 NOTE — Assessment & Plan Note (Signed)
This is likely anemia of chronic disease and from bone mets. The patient denies recent history of bleeding such as epistaxis, hematuria or hematochezia. He is asymptomatic from the anemia. We will observe for now.  He does not require transfusion now.  He will get 1 unit of blood whenever hemoglobin dropped to less than 8 g

## 2014-09-06 NOTE — Assessment & Plan Note (Signed)
He had grade 2 neuropathy. We will continue only single agent paclitaxel in the future.

## 2014-09-06 NOTE — Assessment & Plan Note (Signed)
Imaging study did not suggest cord compression or compromise. CT scan showed diffuse lymphadenopathy. We will continue chemotherapy. In the meantime, he will continue laxative therapy.

## 2014-09-06 NOTE — Patient Instructions (Signed)

## 2014-09-06 NOTE — Assessment & Plan Note (Signed)
I recommend dietitian to follow closely. The patient had problem with chewing. His daughter is currently blending food for him.

## 2014-09-06 NOTE — Assessment & Plan Note (Signed)
He tolerated treatment well up off from severe anemia, which is not unexpected given significant bone marrow disease.. I recommend modified dose at 3 weeks on 1 week off. I recommend minimum 3 cycles of treatment before restaging scans. So far, he tolerated treatment well without major side effects.

## 2014-09-06 NOTE — Assessment & Plan Note (Signed)
The cause is unknown, could be due to poor nutrition versus SIADH from cancer. He is not symptomatic. Continue close observation.

## 2014-09-06 NOTE — Progress Notes (Signed)
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     07/09/2012 Initial Diagnosis Carcinoma of supraglottis from biopsy, HPV negative   08/13/2012 Surgery He underwent laryngectomy and bilateral LN dissection. There were bilateral LN involvement and 10/25 LN were positive with extracapsular extension staging T3N2Mx   09/20/2012 - 10/29/2012 Chemotherapy he received concurrent chemo/Rt with weekly cisplatin   02/23/2013 Imaging Staging PET/Ct scan showed focal hypermetabolic bone lesions from metastatic cancer   03/09/2013 Procedure Placement of infusaport under IR   03/15/2013 - 07/19/2013 Chemotherapy Start cycle 1 palliative chemotherapy with Weekly Carboplatin & Paclitaxel with mild dosage adjustment due to elevated creatinine. Chemotherapy was subsequently discontinued as [name]   05/03/2013 Imaging Repeat PEt/CT showed no evidence of local laryngeal squamous cell carcinoma in the neck & interval resolution of metabolic activity associated skeletal metastases.    05/10/2013 Adverse Reaction Dose of Paclitxel was reduced by 20% due to worsening neuropathy   07/15/2013 Imaging Repeat PET CT scan show resolution of his disease in the bones. There is mild  inflammatory/infectious process in his lungs but the patient is not symptomatic   10/14/2013 Imaging CT scan show no evidence of disease progression.   04/14/2014 Imaging repeat CT scan show no evidence of disease progression   07/27/2014 Imaging CT scan of the neck and the chest showed no lymphadenopathy, lung nodule and bone metastasis   08/09/2014 -  Chemotherapy He received weekly Paclitaxel    INTERVAL HISTORY: Please see below for problem oriented charting. He is seen prior to cycle 2 of chemotherapy. His symptoms are about the same. He has lost 1 pound weight. He had difficulty chewing food and saw a dentist recently. He continues to have mild persistent constipation, relieved with laxative. He had some nausea but no vomiting. Denies mucositis. Denies worsening neuropathy.  REVIEW OF SYSTEMS:   Constitutional: Denies fevers, chills or abnormal weight loss Eyes: Denies blurriness of vision Ears, nose, mouth, throat, and face: Denies mucositis or sore throat Respiratory: Denies cough, dyspnea or wheezes Cardiovascular: Denies palpitation, chest discomfort or lower extremity swelling Skin: Denies abnormal skin rashes Lymphatics: Denies new lymphadenopathy or easy bruising Neurological:Denies numbness, tingling or new weaknesses Behavioral/Psych: Mood is stable, no new changes  All other systems were reviewed with the patient and are negative.  I have reviewed the past medical history, past surgical history, social history and family history with the patient and they are unchanged from previous note.  ALLERGIES:  has No Known Allergies.  MEDICATIONS:  Current Outpatient Prescriptions  Medication Sig Dispense Refill  . acetaminophen (TYLENOL) 325 MG tablet Take 650 mg by mouth daily as needed for pain.    Marland Kitchen levothyroxine (SYNTHROID, LEVOTHROID) 75 MCG tablet Take 1 tablet (75 mcg total) by mouth daily before breakfast. 30 tablet 6  .  Multiple Vitamin (MULTIVITAMIN) tablet Take 1 tablet  by mouth daily.    . ondansetron (ZOFRAN) 8 MG tablet Take 1 tablet (8 mg total) by mouth every 8 (eight) hours as needed for nausea. 60 tablet 3  . oxyCODONE (OXY IR/ROXICODONE) 5 MG immediate release tablet Take 1 tablet (5 mg total) by mouth every 4 (four) hours as needed for severe pain. 30 tablet 0  . polyethylene glycol (MIRALAX) packet Take 17 g by mouth daily. 14 each 0  . prochlorperazine (COMPAZINE) 10 MG tablet Take 1 tablet (10 mg total) by mouth every 6 (six) hours as needed for nausea or vomiting. 60 tablet 6   No current facility-administered medications for this visit.    PHYSICAL EXAMINATION: ECOG PERFORMANCE STATUS: 2 - Symptomatic, <50% confined to bed  Filed Vitals:   09/06/14 1139  BP: 90/59  Pulse: 85  Temp: 98 F (36.7 C)  Resp: 18   Filed Weights   09/06/14 1139  Weight: 114 lb 1.6 oz (51.755 kg)    GENERAL:alert, no distress and comfortable. He looks  thin SKIN: skin color, texture, turgor are normal, no rashes or significant lesions EYES: normal, Conjunctiva are pink and non-injected, sclera clear OROPHARYNX:no exudate, no erythema and lips, buccal mucosa, and tongue normal  NECK: Noted tracheostomy scar. No signs of infection.  LYMPH:  no palpable lymphadenopathy in the cervical, axillary or inguinal LUNGS: clear to auscultation and percussion with normal breathing effort HEART: regular rate & rhythm and no murmurs and no lower extremity edema ABDOMEN:abdomen soft, non-tender and normal bowel sounds Musculoskeletal:no cyanosis of digits and no clubbing  NEURO: alert & oriented x 3 with fluent speech, no focal motor/sensory deficits  LABORATORY DATA:  I have reviewed the data as listed    Component Value Date/Time   NA 132* 09/06/2014 1034   NA 136 08/19/2012 0625   K 4.2 09/06/2014 1034   K 4.3 08/19/2012 0625   CL 98 11/15/2012 0941   CL 100 08/19/2012 0625   CO2 23 09/06/2014 1034   CO2 28 08/19/2012 0625   GLUCOSE 96 09/06/2014 1034    GLUCOSE 90 11/15/2012 0941   GLUCOSE 109* 08/19/2012 0625   BUN 10.4 09/06/2014 1034   BUN 9 08/19/2012 0625   CREATININE 0.9 09/06/2014 1034   CREATININE 0.69 08/19/2012 0625   CALCIUM 8.4 09/06/2014 1034   CALCIUM 8.3* 08/20/2012 0542   PROT 7.7 09/06/2014 1034   PROT 6.9 08/19/2012 0625   ALBUMIN 2.3* 09/06/2014 1034   ALBUMIN 2.4* 08/19/2012 0625   AST 54* 09/06/2014 1034   AST 25 08/19/2012 0625   ALT 30 09/06/2014 1034   ALT 20 08/19/2012 0625   ALKPHOS 108 09/06/2014 1034   ALKPHOS 57 08/19/2012 0625   BILITOT 0.56 09/06/2014 1034   BILITOT 0.6 08/19/2012 0625   GFRNONAA >90 08/19/2012 0625   GFRAA >90 08/19/2012 0625    No results found for: SPEP, UPEP  Lab Results  Component Value Date   WBC 6.8 09/06/2014   NEUTROABS 4.7 09/06/2014   HGB 8.9* 09/06/2014   HCT 27.4* 09/06/2014   MCV 81.8 09/06/2014   PLT 349 09/06/2014      Chemistry      Component Value Date/Time   NA 132* 09/06/2014 1034   NA 136 08/19/2012 0625   K 4.2 09/06/2014 1034   K 4.3 08/19/2012 0625   CL 98 11/15/2012 0941   CL 100 08/19/2012 0625   CO2 23 09/06/2014 1034   CO2 28  08/19/2012 0625   BUN 10.4 09/06/2014 1034   BUN 9 08/19/2012 0625   CREATININE 0.9 09/06/2014 1034   CREATININE 0.69 08/19/2012 0625      Component Value Date/Time   CALCIUM 8.4 09/06/2014 1034   CALCIUM 8.3* 08/20/2012 0542   ALKPHOS 108 09/06/2014 1034   ALKPHOS 57 08/19/2012 0625   AST 54* 09/06/2014 1034   AST 25 08/19/2012 0625   ALT 30 09/06/2014 1034   ALT 20 08/19/2012 0625   BILITOT 0.56 09/06/2014 1034   BILITOT 0.6 08/19/2012 0625      ASSESSMENT & PLAN:  Carcinoma of supraglottis He tolerated treatment well up off from severe anemia, which is not unexpected given significant bone marrow disease.. I recommend modified dose at 3 weeks on 1 week off. I recommend minimum 3 cycles of treatment before restaging scans. So far, he tolerated treatment well without major side  effects.     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 The patient denies recent history of bleeding such as epistaxis, hematuria or hematochezia. He is asymptomatic from the anemia. We will observe for now.  He does not require transfusion now.  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 We will continue only single agent paclitaxel in the future.     Protein calorie malnutrition I recommend dietitian to follow closely. The patient had problem with chewing. His daughter is currently blending food for him.   Hyponatremia The cause is unknown, could be due to poor nutrition versus SIADH from cancer. He is not symptomatic. Continue close observation.   Hypotension, chronic He has chronic hypotension due to poor oral intake. He is not symptomatic. Continue close observation   Nausea and vomiting This could be related to his disease, chemotherapy or constipation. I recommend he continue anti-emetics as needed.   Other constipation Imaging study did not suggest cord compression or compromise. CT scan showed diffuse lymphadenopathy. We will continue chemotherapy. In the meantime, he will continue laxative therapy.      No orders of the defined types were placed in this encounter.   All questions were answered. The patient knows to call the clinic with any problems, questions or concerns. No barriers to learning was detected. I spent 30 minutes counseling the patient face to face. The total time spent in the appointment was 40 minutes and more than 50% was on counseling and review of test results     [facility], [place], MD 09/06/2014 12:26 PM

## 2014-09-06 NOTE — Assessment & Plan Note (Signed)
This could be related to his disease, chemotherapy or constipation. I recommend he continue anti-emetics as needed.

## 2014-09-06 NOTE — Assessment & Plan Note (Signed)
He has chronic hypotension due to poor oral intake. He is not symptomatic. Continue close observation

## 2014-09-06 NOTE — Telephone Encounter (Signed)
added appt pt will get sched from tx

## 2014-09-13 ENCOUNTER — Other Ambulatory Visit (HOSPITAL_BASED_OUTPATIENT_CLINIC_OR_DEPARTMENT_OTHER): Payer: Medicare Other

## 2014-09-13 ENCOUNTER — Other Ambulatory Visit: Payer: Self-pay | Admitting: Hematology and Oncology

## 2014-09-13 ENCOUNTER — Other Ambulatory Visit: Payer: Self-pay

## 2014-09-13 ENCOUNTER — Ambulatory Visit: Payer: Medicare Other

## 2014-09-13 ENCOUNTER — Ambulatory Visit (HOSPITAL_BASED_OUTPATIENT_CLINIC_OR_DEPARTMENT_OTHER): Payer: Medicare Other

## 2014-09-13 DIAGNOSIS — Z5111 Encounter for antineoplastic chemotherapy: Secondary | ICD-10-CM | POA: Diagnosis present

## 2014-09-13 DIAGNOSIS — C321 Malignant neoplasm of supraglottis: Secondary | ICD-10-CM

## 2014-09-13 DIAGNOSIS — D630 Anemia in neoplastic disease: Secondary | ICD-10-CM | POA: Diagnosis not present

## 2014-09-13 DIAGNOSIS — Z95828 Presence of other vascular implants and grafts: Secondary | ICD-10-CM

## 2014-09-13 LAB — COMPREHENSIVE METABOLIC PANEL (CC13)
ALBUMIN: 2.5 g/dL — AB (ref 3.5–5.0)
ALT: 37 U/L (ref 0–55)
ANION GAP: 12 meq/L — AB (ref 3–11)
AST: 68 U/L — ABNORMAL HIGH (ref 5–34)
Alkaline Phosphatase: 110 U/L (ref 40–150)
BILIRUBIN TOTAL: 0.61 mg/dL (ref 0.20–1.20)
BUN: 13.5 mg/dL (ref 7.0–26.0)
CO2: 22 meq/L (ref 22–29)
Calcium: 8.8 mg/dL (ref 8.4–10.4)
Chloride: 97 mEq/L — ABNORMAL LOW (ref 98–109)
Creatinine: 1.1 mg/dL (ref 0.7–1.3)
EGFR: 78 mL/min/{1.73_m2} — AB (ref >90)
Glucose: 95 mg/dl (ref 70–140)
POTASSIUM: 4.6 meq/L (ref 3.5–5.1)
SODIUM: 131 meq/L — AB (ref 136–145)
Total Protein: 8.1 g/dL (ref 6.4–8.3)

## 2014-09-13 LAB — CBC WITH DIFFERENTIAL/PLATELET
BASO%: 0.4 % (ref 0.0–2.0)
Basophils Absolute: 0 10*3/uL (ref 0.0–0.1)
EOS%: 3.8 % (ref 0.0–7.0)
Eosinophils Absolute: 0.2 10*3/uL (ref 0.0–0.5)
HCT: 30.9 % — ABNORMAL LOW (ref 38.4–49.9)
HGB: 10.2 g/dL — ABNORMAL LOW (ref 13.0–17.1)
LYMPH#: 0.5 10*3/uL — AB (ref 0.9–3.3)
LYMPH%: 10 % — AB (ref 14.0–49.0)
MCH: 26.8 pg — AB (ref 27.2–33.4)
MCHC: 33 g/dL (ref 32.0–36.0)
MCV: 81.1 fL (ref 79.3–98.0)
MONO#: 0.6 10*3/uL (ref 0.1–0.9)
MONO%: 12.2 % (ref 0.0–14.0)
NEUT#: 3.7 10*3/uL (ref 1.5–6.5)
NEUT%: 73.6 % (ref 39.0–75.0)
PLATELETS: 343 10*3/uL (ref 140–400)
RBC: 3.81 10*6/uL — ABNORMAL LOW (ref 4.20–5.82)
RDW: 19 % — ABNORMAL HIGH (ref 11.0–14.6)
WBC: 5 10*3/uL (ref 4.0–10.3)
nRBC: 0 % (ref 0–0)

## 2014-09-13 LAB — HOLD TUBE, BLOOD BANK

## 2014-09-13 MED ORDER — SODIUM CHLORIDE 0.9 % IV SOLN: Freq: Once | INTRAVENOUS | Status: DC

## 2014-09-13 MED ORDER — FAMOTIDINE IN NACL 20-0.9 MG/50ML-% IV SOLN
INTRAVENOUS | Status: AC
  Filled 2014-09-13: qty 50

## 2014-09-13 MED ORDER — HEPARIN SOD (PORK) LOCK FLUSH 100 UNIT/ML IV SOLN
500.0000 [IU] | Freq: Once | INTRAVENOUS | Status: AC | PRN
  Administered 2014-09-13: 500 [IU]
  Filled 2014-09-13: qty 5

## 2014-09-13 MED ORDER — DIPHENHYDRAMINE HCL 50 MG/ML IJ SOLN
INTRAMUSCULAR | Status: AC
  Filled 2014-09-13: qty 1

## 2014-09-13 MED ORDER — SODIUM CHLORIDE 0.9 % IJ SOLN
10.0000 mL | INTRAMUSCULAR | Status: DC | PRN
  Administered 2014-09-13: 10 mL
  Filled 2014-09-13: qty 10

## 2014-09-13 MED ORDER — SODIUM CHLORIDE 0.9 % IV SOLN
Freq: Once | INTRAVENOUS | Status: AC
  Administered 2014-09-13: 14:00:00 via INTRAVENOUS
  Filled 2014-09-13: qty 4

## 2014-09-13 MED ORDER — FAMOTIDINE IN NACL 20-0.9 MG/50ML-% IV SOLN
20.0000 mg | Freq: Once | INTRAVENOUS | Status: AC
  Administered 2014-09-13: 20 mg via INTRAVENOUS

## 2014-09-13 MED ORDER — PACLITAXEL CHEMO INJECTION 300 MG/50ML
60.0000 mg/m2 | Freq: Once | INTRAVENOUS | Status: AC
  Administered 2014-09-13: 96 mg via INTRAVENOUS
  Filled 2014-09-13: qty 16

## 2014-09-13 MED ORDER — DIPHENHYDRAMINE HCL 50 MG/ML IJ SOLN
50.0000 mg | Freq: Once | INTRAMUSCULAR | Status: AC
  Administered 2014-09-13: 50 mg via INTRAVENOUS

## 2014-09-13 MED ORDER — SODIUM CHLORIDE 0.9 % IJ SOLN
10.0000 mL | INTRAMUSCULAR | Status: DC | PRN
  Administered 2014-09-13: 10 mL via INTRAVENOUS
  Filled 2014-09-13: qty 10

## 2014-09-13 NOTE — Patient Instructions (Signed)

## 2014-09-13 NOTE — Patient Instructions (Signed)
[facility] Discharge Instructions for Patients Receiving Chemotherapy  Today you received the following chemotherapy agents Paclitaxel.   To help prevent nausea and vomiting after your treatment, we encourage you to take your nausea medication as directed.    If you develop nausea and vomiting that is not controlled by your nausea medication, call the clinic.   BELOW ARE SYMPTOMS THAT SHOULD BE REPORTED IMMEDIATELY:  *FEVER GREATER THAN 100.5 F  *CHILLS WITH OR WITHOUT FEVER  NAUSEA AND VOMITING THAT IS NOT CONTROLLED WITH YOUR NAUSEA MEDICATION  *UNUSUAL SHORTNESS OF BREATH  *UNUSUAL BRUISING OR BLEEDING  TENDERNESS IN MOUTH AND THROAT WITH OR WITHOUT PRESENCE OF ULCERS  *URINARY PROBLEMS  *BOWEL PROBLEMS  UNUSUAL RASH Items with * indicate a potential emergency and should be followed up as soon as possible.  Feel free to call the clinic you have any questions or concerns. The clinic phone number is [phone].  Please show the CHEMO ALERT CARD at check-in to the Emergency Department and triage nurse.   

## 2014-09-20 ENCOUNTER — Other Ambulatory Visit (HOSPITAL_BASED_OUTPATIENT_CLINIC_OR_DEPARTMENT_OTHER): Payer: Medicare Other

## 2014-09-20 ENCOUNTER — Ambulatory Visit: Payer: Medicare Other | Admitting: Nutrition

## 2014-09-20 ENCOUNTER — Ambulatory Visit (HOSPITAL_COMMUNITY)
Admission: RE | Admit: 2014-09-20 | Discharge: 2014-09-20 | Disposition: A | Discharge status: Home / Self Care | Payer: Medicare Other | Source: Ambulatory Visit | Attending: Hematology and Oncology | Admitting: Hematology and Oncology

## 2014-09-20 ENCOUNTER — Ambulatory Visit: Payer: Medicare Other

## 2014-09-20 ENCOUNTER — Ambulatory Visit (HOSPITAL_BASED_OUTPATIENT_CLINIC_OR_DEPARTMENT_OTHER): Payer: Medicare Other

## 2014-09-20 DIAGNOSIS — C7951 Secondary malignant neoplasm of bone: Secondary | ICD-10-CM

## 2014-09-20 DIAGNOSIS — C321 Malignant neoplasm of supraglottis: Secondary | ICD-10-CM

## 2014-09-20 DIAGNOSIS — Z5111 Encounter for antineoplastic chemotherapy: Secondary | ICD-10-CM

## 2014-09-20 DIAGNOSIS — Z95828 Presence of other vascular implants and grafts: Secondary | ICD-10-CM

## 2014-09-20 DIAGNOSIS — D630 Anemia in neoplastic disease: Secondary | ICD-10-CM

## 2014-09-20 LAB — COMPREHENSIVE METABOLIC PANEL (CC13)
ALBUMIN: 2.5 g/dL — AB (ref 3.5–5.0)
ALK PHOS: 99 U/L (ref 40–150)
ALT: 27 U/L (ref 0–55)
AST: 66 U/L — ABNORMAL HIGH (ref 5–34)
Anion Gap: 11 mEq/L (ref 3–11)
BILIRUBIN TOTAL: 0.66 mg/dL (ref 0.20–1.20)
BUN: 14.4 mg/dL (ref 7.0–26.0)
CO2: 23 mEq/L (ref 22–29)
Calcium: 8.5 mg/dL (ref 8.4–10.4)
Chloride: 97 mEq/L — ABNORMAL LOW (ref 98–109)
Creatinine: 1.1 mg/dL (ref 0.7–1.3)
EGFR: 84 mL/min/{1.73_m2} — ABNORMAL LOW (ref >90)
Glucose: 103 mg/dl (ref 70–140)
Potassium: 3.7 mEq/L (ref 3.5–5.1)
Sodium: 131 mEq/L — ABNORMAL LOW (ref 136–145)
Total Protein: 7.5 g/dL (ref 6.4–8.3)

## 2014-09-20 LAB — CBC WITH DIFFERENTIAL/PLATELET
BASO%: 1.5 % (ref 0.0–2.0)
Basophils Absolute: 0.1 10*3/uL (ref 0.0–0.1)
EOS%: 2.8 % (ref 0.0–7.0)
Eosinophils Absolute: 0.1 10*3/uL (ref 0.0–0.5)
HCT: 24.9 % — ABNORMAL LOW (ref 38.4–49.9)
HGB: 8.1 g/dL — ABNORMAL LOW (ref 13.0–17.1)
LYMPH%: 11.5 % — AB (ref 14.0–49.0)
MCH: 26.5 pg — ABNORMAL LOW (ref 27.2–33.4)
MCHC: 32.4 g/dL (ref 32.0–36.0)
MCV: 81.7 fL (ref 79.3–98.0)
MONO#: 0.7 10*3/uL (ref 0.1–0.9)
MONO%: 12.7 % (ref 0.0–14.0)
NEUT#: 3.7 10*3/uL (ref 1.5–6.5)
NEUT%: 71.5 % (ref 39.0–75.0)
PLATELETS: 377 10*3/uL (ref 140–400)
RBC: 3.04 10*6/uL — AB (ref 4.20–5.82)
RDW: 20.2 % — ABNORMAL HIGH (ref 11.0–14.6)
WBC: 5.2 10*3/uL (ref 4.0–10.3)
lymph#: 0.6 10*3/uL — ABNORMAL LOW (ref 0.9–3.3)

## 2014-09-20 LAB — PREPARE RBC (CROSSMATCH)

## 2014-09-20 LAB — HOLD TUBE, BLOOD BANK

## 2014-09-20 MED ORDER — HEPARIN SOD (PORK) LOCK FLUSH 100 UNIT/ML IV SOLN
500.0000 [IU] | Freq: Once | INTRAVENOUS | Status: AC | PRN
  Administered 2014-09-20: 500 [IU]
  Filled 2014-09-20: qty 5

## 2014-09-20 MED ORDER — PACLITAXEL CHEMO INJECTION 300 MG/50ML
60.0000 mg/m2 | Freq: Once | INTRAVENOUS | Status: AC
  Administered 2014-09-20: 96 mg via INTRAVENOUS
  Filled 2014-09-20: qty 16

## 2014-09-20 MED ORDER — DIPHENHYDRAMINE HCL 50 MG/ML IJ SOLN
INTRAMUSCULAR | Status: AC
  Filled 2014-09-20: qty 1

## 2014-09-20 MED ORDER — SODIUM CHLORIDE 0.9 % IJ SOLN
10.0000 mL | INTRAMUSCULAR | Status: DC | PRN
  Administered 2014-09-20: 10 mL
  Filled 2014-09-20: qty 10

## 2014-09-20 MED ORDER — SODIUM CHLORIDE 0.9 % IV SOLN
Freq: Once | INTRAVENOUS | Status: AC
  Administered 2014-09-20: 15:00:00 via INTRAVENOUS

## 2014-09-20 MED ORDER — FAMOTIDINE IN NACL 20-0.9 MG/50ML-% IV SOLN
INTRAVENOUS | Status: AC
  Filled 2014-09-20: qty 50

## 2014-09-20 MED ORDER — FAMOTIDINE IN NACL 20-0.9 MG/50ML-% IV SOLN
20.0000 mg | Freq: Once | INTRAVENOUS | Status: AC
  Administered 2014-09-20: 20 mg via INTRAVENOUS

## 2014-09-20 MED ORDER — SODIUM CHLORIDE 0.9 % IJ SOLN
10.0000 mL | INTRAMUSCULAR | Status: DC | PRN
  Administered 2014-09-20: 10 mL via INTRAVENOUS
  Filled 2014-09-20: qty 10

## 2014-09-20 MED ORDER — SODIUM CHLORIDE 0.9 % IV SOLN
Freq: Once | INTRAVENOUS | Status: AC
  Administered 2014-09-20: 15:00:00 via INTRAVENOUS
  Filled 2014-09-20: qty 4

## 2014-09-20 MED ORDER — DIPHENHYDRAMINE HCL 50 MG/ML IJ SOLN
50.0000 mg | Freq: Once | INTRAMUSCULAR | Status: AC
  Administered 2014-09-20: 50 mg via INTRAVENOUS

## 2014-09-20 NOTE — Patient Instructions (Addendum)
[facility] Discharge Instructions for Patients Receiving Chemotherapy  Today you received the following chemotherapy agents: Taxol  To help prevent nausea and vomiting after your treatment, we encourage you to take your nausea medication as prescribed by your physician.   If you develop nausea and vomiting that is not controlled by your nausea medication, call the clinic.   BELOW ARE SYMPTOMS THAT SHOULD BE REPORTED IMMEDIATELY:  *FEVER GREATER THAN 100.5 F  *CHILLS WITH OR WITHOUT FEVER  NAUSEA AND VOMITING THAT IS NOT CONTROLLED WITH YOUR NAUSEA MEDICATION  *UNUSUAL SHORTNESS OF BREATH  *UNUSUAL BRUISING OR BLEEDING  TENDERNESS IN MOUTH AND THROAT WITH OR WITHOUT PRESENCE OF ULCERS  *URINARY PROBLEMS  *BOWEL PROBLEMS  UNUSUAL RASH Items with * indicate a potential emergency and should be followed up as soon as possible.  Feel free to call the clinic should you have any questions or concerns. The clinic phone number is [phone].  Please show the [facility] at check-in to the Emergency Department and triage nurse.

## 2014-09-20 NOTE — Patient Instructions (Signed)

## 2014-09-20 NOTE — Progress Notes (Signed)
Dr. Alvy Bimler states ok to treat today with Hgb 8.1 and give one unit of blood within next 2 days.

## 2014-09-20 NOTE — Progress Notes (Signed)
Nutrition follow-up completed with patient and daughter. Patient continues to eat well per daughter.   He continues to graze throughout the day. Patient is drinking Ensure Plus 3 times a day Weight documented as 114.1 pounds, stable from 115 pounds March 2. Patient remains underweight.  Nutrition diagnosis: Malnutrition continues.  Intervention:  Patient to continue strategies for increased oral intake including Ensure Plus or boost +3 times a day to 4 times a day. Daughter will continue to fix foods per patient desire. Provided oral nutrition supplement coupons. Teach back method used.  Monitoring, evaluation, goals: Patient will work to continue increased oral intake to minimize weight loss.  Next visit: Wednesday, May 4, during infusion.  **Disclaimer: This note was dictated with voice recognition software. Similar sounding words can inadvertently be transcribed and this note may contain transcription errors which may not have been corrected upon publication of note.**

## 2014-09-22 ENCOUNTER — Ambulatory Visit (HOSPITAL_BASED_OUTPATIENT_CLINIC_OR_DEPARTMENT_OTHER): Payer: Medicare Other

## 2014-09-22 VITALS — BP 94/55 | HR 82 | Temp 98.4°F | Resp 18

## 2014-09-22 DIAGNOSIS — Z452 Encounter for adjustment and management of vascular access device: Secondary | ICD-10-CM

## 2014-09-22 DIAGNOSIS — C321 Malignant neoplasm of supraglottis: Secondary | ICD-10-CM | POA: Diagnosis not present

## 2014-09-22 MED ORDER — HEPARIN SOD (PORK) LOCK FLUSH 100 UNIT/ML IV SOLN
500.0000 [IU] | Freq: Every day | INTRAVENOUS | Status: AC | PRN
  Administered 2014-09-22: 500 [IU]
  Filled 2014-09-22: qty 5

## 2014-09-22 MED ORDER — SODIUM CHLORIDE 0.9 % IJ SOLN
10.0000 mL | INTRAMUSCULAR | Status: AC | PRN
  Administered 2014-09-22: 10 mL
  Filled 2014-09-22: qty 10

## 2014-09-22 MED ORDER — SODIUM CHLORIDE 0.9 % IV SOLN
250.0000 mL | Freq: Once | INTRAVENOUS | Status: AC
  Administered 2014-09-22: 250 mL via INTRAVENOUS

## 2014-09-22 NOTE — Patient Instructions (Signed)

## 2014-09-25 LAB — TYPE AND SCREEN
ABO/RH(D): B NEG
Antibody Screen: NEGATIVE
Unit division: 0

## 2014-10-03 ENCOUNTER — Telehealth: Payer: Self-pay | Admitting: *Deleted

## 2014-10-03 ENCOUNTER — Other Ambulatory Visit: Payer: Self-pay | Admitting: Hematology and Oncology

## 2014-10-03 ENCOUNTER — Encounter: Payer: Self-pay | Admitting: Hematology and Oncology

## 2014-10-03 DIAGNOSIS — C321 Malignant neoplasm of supraglottis: Secondary | ICD-10-CM

## 2014-10-03 DIAGNOSIS — R300 Dysuria: Secondary | ICD-10-CM

## 2014-10-03 NOTE — Telephone Encounter (Signed)
I will assess tomorrow. Will add UA to exclude UTI If he cannot proceed w treatment, he can still get IVF that may help with his symptoms of dehydration Please recommend him to keep his appt

## 2014-10-03 NOTE — Telephone Encounter (Signed)
Daughter notified of message below 

## 2014-10-03 NOTE — Telephone Encounter (Signed)
TC from pt's daughter, Solmon Ice.  She is calling because her father has not re-bounded well from his last chemotherapy treatment on 09/20/14.  She states that typically he has a couple of days of nausea and a little vomiting. However this time it has persisted for the last 2 weeks.  She is concerned that he will not be able to tolerate treatment scheduled for tomorrow. He is able to keep fluids down, she thinks he is staying fairly well hydrated. He does ok in the morning with grits for breakfast. But in the afternoon he experiences more nausea and vomiting. She thinks the compazine works better than the Zofran and she has been giving that to him ATC.  Denies fever or pain. Does complain of generalized weakness. Some trouble with constipation -last BM was 2 days ago. He is taking miralax daily at this time.  He has been trying to add Boost supplements to his diet but is not always able to keep that down. Pt. Has appt for labs, Dr. Alvy Bimler and chemo tomorrow  (10/04/14) Daughter is concerned about ongoing nausea/vomiting and treatment scheduled for tomorrow. Please advise.

## 2014-10-04 ENCOUNTER — Telehealth: Payer: Self-pay | Admitting: Hematology and Oncology

## 2014-10-04 ENCOUNTER — Encounter: Payer: Self-pay | Admitting: Hematology and Oncology

## 2014-10-04 ENCOUNTER — Other Ambulatory Visit (HOSPITAL_BASED_OUTPATIENT_CLINIC_OR_DEPARTMENT_OTHER): Payer: Medicare Other

## 2014-10-04 ENCOUNTER — Ambulatory Visit (HOSPITAL_BASED_OUTPATIENT_CLINIC_OR_DEPARTMENT_OTHER): Payer: Medicare Other

## 2014-10-04 ENCOUNTER — Ambulatory Visit: Payer: Medicare Other

## 2014-10-04 ENCOUNTER — Ambulatory Visit (HOSPITAL_BASED_OUTPATIENT_CLINIC_OR_DEPARTMENT_OTHER): Payer: Medicare Other | Admitting: Hematology and Oncology

## 2014-10-04 VITALS — BP 122/67 | HR 80 | Temp 97.4°F | Resp 18 | Ht 69.0 in | Wt 113.4 lb

## 2014-10-04 DIAGNOSIS — K5909 Other constipation: Secondary | ICD-10-CM | POA: Diagnosis not present

## 2014-10-04 DIAGNOSIS — C321 Malignant neoplasm of supraglottis: Secondary | ICD-10-CM | POA: Diagnosis present

## 2014-10-04 DIAGNOSIS — T451X5A Adverse effect of antineoplastic and immunosuppressive drugs, initial encounter: Secondary | ICD-10-CM

## 2014-10-04 DIAGNOSIS — E871 Hypo-osmolality and hyponatremia: Secondary | ICD-10-CM

## 2014-10-04 DIAGNOSIS — E46 Unspecified protein-calorie malnutrition: Secondary | ICD-10-CM | POA: Diagnosis not present

## 2014-10-04 DIAGNOSIS — D630 Anemia in neoplastic disease: Secondary | ICD-10-CM | POA: Diagnosis not present

## 2014-10-04 DIAGNOSIS — R112 Nausea with vomiting, unspecified: Secondary | ICD-10-CM

## 2014-10-04 DIAGNOSIS — Z5111 Encounter for antineoplastic chemotherapy: Secondary | ICD-10-CM

## 2014-10-04 DIAGNOSIS — R300 Dysuria: Secondary | ICD-10-CM

## 2014-10-04 DIAGNOSIS — C7931 Secondary malignant neoplasm of brain: Secondary | ICD-10-CM

## 2014-10-04 DIAGNOSIS — Z79899 Other long term (current) drug therapy: Secondary | ICD-10-CM

## 2014-10-04 DIAGNOSIS — Z95828 Presence of other vascular implants and grafts: Secondary | ICD-10-CM

## 2014-10-04 DIAGNOSIS — C7951 Secondary malignant neoplasm of bone: Secondary | ICD-10-CM

## 2014-10-04 DIAGNOSIS — C7800 Secondary malignant neoplasm of unspecified lung: Secondary | ICD-10-CM

## 2014-10-04 DIAGNOSIS — C439 Malignant melanoma of skin, unspecified: Secondary | ICD-10-CM

## 2014-10-04 LAB — URINALYSIS, MICROSCOPIC - CHCC
Bilirubin (Urine): NEGATIVE
Blood: NEGATIVE
Glucose: NEGATIVE mg/dL
Ketones: NEGATIVE mg/dL
LEUKOCYTE ESTERASE: NEGATIVE
NITRITE: NEGATIVE
PROTEIN: NEGATIVE mg/dL
Specific Gravity, Urine: 1.005 (ref 1.003–1.035)
Urobilinogen, UR: 0.2 mg/dL (ref 0.2–1)
pH: 5 (ref 4.6–8.0)

## 2014-10-04 LAB — COMPREHENSIVE METABOLIC PANEL (CC13)
ALT: 14 U/L (ref 0–55)
AST: 65 U/L — ABNORMAL HIGH (ref 5–34)
Albumin: 2.5 g/dL — ABNORMAL LOW (ref 3.5–5.0)
Alkaline Phosphatase: 86 U/L (ref 40–150)
Anion Gap: 11 mEq/L (ref 3–11)
BUN: 10.6 mg/dL (ref 7.0–26.0)
CALCIUM: 8.1 mg/dL — AB (ref 8.4–10.4)
CHLORIDE: 98 meq/L (ref 98–109)
CO2: 20 mEq/L — ABNORMAL LOW (ref 22–29)
Creatinine: 1 mg/dL (ref 0.7–1.3)
EGFR: >90 mL/min/{1.73_m2} (ref >90)
Glucose: 105 mg/dl (ref 70–140)
Potassium: 4 mEq/L (ref 3.5–5.1)
SODIUM: 129 meq/L — AB (ref 136–145)
TOTAL PROTEIN: 7 g/dL (ref 6.4–8.3)
Total Bilirubin: 0.47 mg/dL (ref 0.20–1.20)

## 2014-10-04 LAB — CBC WITH DIFFERENTIAL/PLATELET
BASO%: 0.9 % (ref 0.0–2.0)
Basophils Absolute: 0.1 10*3/uL (ref 0.0–0.1)
EOS%: 1.9 % (ref 0.0–7.0)
Eosinophils Absolute: 0.1 10*3/uL (ref 0.0–0.5)
HEMATOCRIT: 26.9 % — AB (ref 38.4–49.9)
HGB: 8.9 g/dL — ABNORMAL LOW (ref 13.0–17.1)
LYMPH#: 1 10*3/uL (ref 0.9–3.3)
LYMPH%: 13.9 % — ABNORMAL LOW (ref 14.0–49.0)
MCH: 28 pg (ref 27.2–33.4)
MCHC: 33.1 g/dL (ref 32.0–36.0)
MCV: 84.6 fL (ref 79.3–98.0)
MONO#: 1.2 10*3/uL — AB (ref 0.1–0.9)
MONO%: 17.6 % — ABNORMAL HIGH (ref 0.0–14.0)
NEUT%: 65.7 % (ref 39.0–75.0)
NEUTROS ABS: 4.6 10*3/uL (ref 1.5–6.5)
Platelets: 311 10*3/uL (ref 140–400)
RBC: 3.18 10*6/uL — AB (ref 4.20–5.82)
RDW: 19.6 % — AB (ref 11.0–14.6)
WBC: 7 10*3/uL (ref 4.0–10.3)

## 2014-10-04 LAB — HOLD TUBE, BLOOD BANK

## 2014-10-04 MED ORDER — SODIUM CHLORIDE 0.9 % IV SOLN
Freq: Once | INTRAVENOUS | Status: AC
  Administered 2014-10-04: 13:00:00 via INTRAVENOUS
  Filled 2014-10-04: qty 5

## 2014-10-04 MED ORDER — HEPARIN SOD (PORK) LOCK FLUSH 100 UNIT/ML IV SOLN
500.0000 [IU] | Freq: Once | INTRAVENOUS | Status: AC | PRN
  Administered 2014-10-04: 500 [IU]
  Filled 2014-10-04: qty 5

## 2014-10-04 MED ORDER — FAMOTIDINE IN NACL 20-0.9 MG/50ML-% IV SOLN
INTRAVENOUS | Status: AC
  Filled 2014-10-04: qty 50

## 2014-10-04 MED ORDER — PACLITAXEL CHEMO INJECTION 300 MG/50ML
60.0000 mg/m2 | Freq: Once | INTRAVENOUS | Status: AC
  Administered 2014-10-04: 96 mg via INTRAVENOUS
  Filled 2014-10-04: qty 16

## 2014-10-04 MED ORDER — METOCLOPRAMIDE HCL 10 MG PO TABS: 10.0000 mg | ORAL_TABLET | Freq: Three times a day (TID) | ORAL | Status: AC

## 2014-10-04 MED ORDER — LACTULOSE 10 GM/15ML PO SOLN: 20.0000 g | Freq: Three times a day (TID) | ORAL | Status: DC

## 2014-10-04 MED ORDER — DIPHENHYDRAMINE HCL 50 MG/ML IJ SOLN
INTRAMUSCULAR | Status: AC
  Filled 2014-10-04: qty 1

## 2014-10-04 MED ORDER — SODIUM CHLORIDE 0.9 % IJ SOLN
10.0000 mL | INTRAMUSCULAR | Status: DC | PRN
  Administered 2014-10-04: 10 mL via INTRAVENOUS
  Filled 2014-10-04: qty 10

## 2014-10-04 MED ORDER — SODIUM CHLORIDE 0.9 % IJ SOLN
10.0000 mL | INTRAMUSCULAR | Status: DC | PRN
  Administered 2014-10-04: 10 mL
  Filled 2014-10-04: qty 10

## 2014-10-04 MED ORDER — SODIUM CHLORIDE 0.9 % IV SOLN
Freq: Once | INTRAVENOUS | Status: AC
  Administered 2014-10-04: 12:00:00 via INTRAVENOUS

## 2014-10-04 MED ORDER — FAMOTIDINE IN NACL 20-0.9 MG/50ML-% IV SOLN
20.0000 mg | Freq: Once | INTRAVENOUS | Status: AC
  Administered 2014-10-04: 20 mg via INTRAVENOUS

## 2014-10-04 MED ORDER — DIPHENHYDRAMINE HCL 50 MG/ML IJ SOLN
50.0000 mg | Freq: Once | INTRAMUSCULAR | Status: AC
  Administered 2014-10-04: 50 mg via INTRAVENOUS

## 2014-10-04 NOTE — Telephone Encounter (Signed)
lvm fo rpt regarding toe 4.8 and 4.29 appts

## 2014-10-04 NOTE — Patient Instructions (Signed)

## 2014-10-04 NOTE — Patient Instructions (Signed)
[facility] Discharge Instructions for Patients Receiving Chemotherapy  Today you received the following chemotherapy agents: taxol  To help prevent nausea and vomiting after your treatment, we encourage you to take your nausea medication.  Take it as often as prescribed.     If you develop nausea and vomiting that is not controlled by your nausea medication, call the clinic. If it is after clinic hours your family physician or the after hours number for the clinic or go to the Emergency Department.   BELOW ARE SYMPTOMS THAT SHOULD BE REPORTED IMMEDIATELY:  *FEVER GREATER THAN 100.5 F  *CHILLS WITH OR WITHOUT FEVER  NAUSEA AND VOMITING THAT IS NOT CONTROLLED WITH YOUR NAUSEA MEDICATION  *UNUSUAL SHORTNESS OF BREATH  *UNUSUAL BRUISING OR BLEEDING  TENDERNESS IN MOUTH AND THROAT WITH OR WITHOUT PRESENCE OF ULCERS  *URINARY PROBLEMS  *BOWEL PROBLEMS  UNUSUAL RASH Items with * indicate a potential emergency and should be followed up as soon as possible.  Feel free to call the clinic you have any questions or concerns. The clinic phone number is [phone].   I have been informed and understand all the instructions given to me. I know to contact the clinic, my physician, or go to the Emergency Department if any problems should occur. I do not have any questions at this time, but understand that I may call the clinic during office hours   should I have any questions or need assistance in obtaining follow up care.    __________________________________________  _____________  __________ Signature of Patient or Authorized Representative            Date                   Time    __________________________________________ Nurse's Signature    

## 2014-10-05 ENCOUNTER — Ambulatory Visit (HOSPITAL_BASED_OUTPATIENT_CLINIC_OR_DEPARTMENT_OTHER): Payer: Medicare Other

## 2014-10-05 ENCOUNTER — Telehealth: Payer: Self-pay | Admitting: *Deleted

## 2014-10-05 VITALS — BP 134/67 | HR 82 | Temp 98.4°F | Resp 18

## 2014-10-05 DIAGNOSIS — Z5189 Encounter for other specified aftercare: Secondary | ICD-10-CM | POA: Diagnosis present

## 2014-10-05 DIAGNOSIS — E46 Unspecified protein-calorie malnutrition: Secondary | ICD-10-CM | POA: Diagnosis not present

## 2014-10-05 DIAGNOSIS — C321 Malignant neoplasm of supraglottis: Secondary | ICD-10-CM | POA: Diagnosis not present

## 2014-10-05 DIAGNOSIS — E871 Hypo-osmolality and hyponatremia: Secondary | ICD-10-CM

## 2014-10-05 DIAGNOSIS — R1115 Cyclical vomiting syndrome unrelated to migraine: Secondary | ICD-10-CM

## 2014-10-05 LAB — URINE CULTURE

## 2014-10-05 MED ORDER — SODIUM CHLORIDE 0.9 % IV SOLN: Freq: Once | INTRAVENOUS | Status: DC

## 2014-10-05 MED ORDER — SODIUM CHLORIDE 0.9 % IV SOLN
Freq: Once | INTRAVENOUS | Status: AC
  Administered 2014-10-05: 16:00:00 via INTRAVENOUS
  Filled 2014-10-05: qty 4

## 2014-10-05 MED ORDER — HEPARIN SOD (PORK) LOCK FLUSH 100 UNIT/ML IV SOLN
250.0000 [IU] | Freq: Once | INTRAVENOUS | Status: DC | PRN
  Filled 2014-10-05: qty 5

## 2014-10-05 MED ORDER — SODIUM CHLORIDE 0.9 % IJ SOLN
10.0000 mL | INTRAMUSCULAR | Status: DC | PRN
  Administered 2014-10-05: 10 mL
  Filled 2014-10-05: qty 10

## 2014-10-05 MED ORDER — ALTEPLASE 2 MG IJ SOLR
2.0000 mg | Freq: Once | INTRAMUSCULAR | Status: DC | PRN
  Filled 2014-10-05: qty 2

## 2014-10-05 MED ORDER — SODIUM CHLORIDE 0.9 % IV SOLN: 10.0000 mg | Freq: Once | INTRAVENOUS | Status: DC

## 2014-10-05 MED ORDER — SODIUM CHLORIDE 0.9 % IV SOLN
Freq: Once | INTRAVENOUS | Status: AC
  Administered 2014-10-05: 15:00:00 via INTRAVENOUS

## 2014-10-05 MED ORDER — HEPARIN SOD (PORK) LOCK FLUSH 100 UNIT/ML IV SOLN
500.0000 [IU] | Freq: Once | INTRAVENOUS | Status: AC | PRN
  Administered 2014-10-05: 500 [IU]
  Filled 2014-10-05: qty 5

## 2014-10-05 NOTE — Assessment & Plan Note (Signed)
He has significant decline in performance status and requested a chairlift. I am concerned that the treatment may not be working. We will proceed with treatment today as discussed below plan to reorder CT scan staging scans to be done before his treatment next week. In the meantime, we will continue on aggressive supportive care.

## 2014-10-05 NOTE — Assessment & Plan Note (Signed)
The cause is unknown, could be due to poor nutritional intake versus SIADH from cancer. Clinically, he appears grossly dehydrated. I recommend IV fluids daily for the next 3 days to support him.

## 2014-10-05 NOTE — Progress Notes (Signed)
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     07/09/2012 Initial Diagnosis Carcinoma of supraglottis from biopsy, HPV negative   08/13/2012 Surgery He underwent laryngectomy and bilateral LN dissection. There were bilateral LN involvement and 10/25 LN were positive with extracapsular extension staging T3N2Mx   09/20/2012 - 10/29/2012 Chemotherapy he received concurrent chemo/Rt with weekly cisplatin   02/23/2013 Imaging Staging PET/Ct scan showed focal hypermetabolic bone lesions from metastatic cancer   03/09/2013 Procedure Placement of infusaport under IR   03/15/2013 - 07/19/2013 Chemotherapy Start cycle 1 palliative chemotherapy with Weekly Carboplatin & Paclitaxel with mild dosage adjustment due to elevated creatinine. Chemotherapy was subsequently discontinued as [name]   05/03/2013 Imaging Repeat PEt/CT showed no evidence of local laryngeal squamous cell carcinoma in the neck & interval resolution of metabolic activity associated skeletal metastases.    05/10/2013 Adverse Reaction Dose of Paclitxel was reduced by 20% due to worsening neuropathy   07/15/2013 Imaging Repeat PET CT scan show resolution of his disease in the bones. There is mild  inflammatory/infectious process in his lungs but the patient is not symptomatic   10/14/2013 Imaging CT scan show no evidence of disease progression.   04/14/2014 Imaging repeat CT scan show no evidence of disease progression   07/27/2014 Imaging CT scan of the neck and the chest showed no lymphadenopathy, lung nodule and bone metastasis   08/09/2014 -  Chemotherapy He received weekly Paclitaxel    INTERVAL HISTORY: Please see below for problem oriented charting. He returns today prior to chemotherapy. He is not doing well with profound weakness, uncontrolled nausea, vomiting and constipation. He continues to have lower back pain radiating down to the leg but declined taking pain medicine. Tylenol was not helpful. His daughter of denies new neurological deficit.  REVIEW OF SYSTEMS:   Constitutional: Denies fevers, chills or abnormal weight loss Eyes: Denies blurriness of vision Ears, nose, mouth, throat, and face: Denies mucositis or sore throat Respiratory: Denies cough, dyspnea or wheezes Cardiovascular: Denies palpitation, chest discomfort or lower extremity swelling Skin: Denies abnormal skin rashes Lymphatics: Denies new lymphadenopathy or easy bruising Neurological:Denies numbness, tingling or new weaknesses Behavioral/Psych: Mood is stable, no new changes  All other systems were reviewed with the patient and are negative.  I have reviewed the past medical history, past surgical history, social history and family history with the patient and they are unchanged from previous note.  ALLERGIES:  has No Known Allergies.  MEDICATIONS:  Current Outpatient Prescriptions  Medication Sig Dispense Refill  . acetaminophen (TYLENOL) 325 MG tablet Take 650 mg by mouth daily as needed for pain.    Marland Kitchen levothyroxine (SYNTHROID, LEVOTHROID) 75 MCG tablet Take 1 tablet (75 mcg total) by mouth daily before breakfast. 30 tablet 6  . Multiple Vitamin (MULTIVITAMIN) tablet  Take 1 tablet by mouth daily.     . polyethylene glycol (MIRALAX) packet Take 17 g by mouth daily. 14 each 0  . prochlorperazine (COMPAZINE) 10 MG tablet Take 1 tablet (10 mg total) by mouth every 6 (six) hours as needed for nausea or vomiting. 60 tablet 6  . lactulose (CHRONULAC) 10 GM/15ML solution Take 30 mLs (20 g total) by mouth 3 (three) times daily. 240 mL 5  . metoCLOPramide (REGLAN) 10 MG tablet Take 1 tablet (10 mg total) by mouth 4 (four) times daily -  before meals and at bedtime. 90 tablet 3  . ondansetron (ZOFRAN) 8 MG tablet Take 1 tablet (8 mg total) by mouth every 8 (eight) hours as needed for nausea. (Patient not taking: Reported on 10/04/2014) 60 tablet 3  . oxyCODONE (OXY IR/ROXICODONE) 5 MG immediate release tablet Take 1 tablet (5 mg total) by mouth every 4 (four) hours as needed for severe pain. (Patient not taking: Reported on 10/04/2014) 30 tablet 0   No current facility-administered medications for this visit.    PHYSICAL EXAMINATION: ECOG PERFORMANCE STATUS: 1 - Symptomatic but completely ambulatory  Filed Vitals:   10/04/14 1123  BP: 122/67  Pulse: 80  Temp: 97.4 F (36.3 C)  Resp: 18   Filed Weights   10/04/14 1123  Weight: 113 lb 6.4 oz (51.438 kg)    GENERAL:alert, no distress and comfortable. He looks cachectic and ill-appearing SKIN: skin color, texture, turgor are normal, no rashes or significant lesions EYES: normal, Conjunctiva are pink and non-injected, sclera clear OROPHARYNX:no exudate, no erythema and lips, buccal mucosa, and tongue normal  NECK: Trachea summary site looks okay. Neck is fibrose from prior surgery and radiation. LYMPH:  no palpable lymphadenopathy in the cervical, axillary or inguinal LUNGS: clear to auscultation and percussion with normal breathing effort HEART: regular rate & rhythm and no murmurs and no lower extremity edema ABDOMEN:abdomen soft, non-tender and normal bowel sounds Musculoskeletal:no cyanosis of digits and no clubbing  NEURO: alert &  oriented x 3 with fluent speech, no focal motor/sensory deficits  LABORATORY DATA:  I have reviewed the data as listed    Component Value Date/Time   NA 129* 10/04/2014 1059   NA 136 08/19/2012 0625   K 4.0 10/04/2014 1059   K 4.3 08/19/2012 0625   CL 98 11/15/2012 0941   CL 100 08/19/2012 0625   CO2 20* 10/04/2014 1059   CO2 28 08/19/2012 0625   GLUCOSE 105 10/04/2014 1059   GLUCOSE 90 11/15/2012 0941   GLUCOSE 109* 08/19/2012 0625   BUN 10.6 10/04/2014 1059   BUN 9 08/19/2012 0625   CREATININE 1.0 10/04/2014 1059   CREATININE 0.69 08/19/2012 0625   CALCIUM 8.1* 10/04/2014 1059   CALCIUM 8.3* 08/20/2012 0542   PROT 7.0 10/04/2014 1059   PROT 6.9 08/19/2012 0625   ALBUMIN 2.5* 10/04/2014 1059   ALBUMIN 2.4* 08/19/2012 0625   AST 65* 10/04/2014 1059   AST 25 08/19/2012 0625   ALT 14 10/04/2014 1059   ALT 20 08/19/2012 0625   ALKPHOS 86 10/04/2014 1059   ALKPHOS 57 08/19/2012 0625   BILITOT 0.47 10/04/2014 1059   BILITOT 0.6 08/19/2012 0625   GFRNONAA >90 08/19/2012 0625   GFRAA >90 08/19/2012 0625    No results found for: SPEP, UPEP  Lab Results  Component Value Date   WBC 7.0 10/04/2014   NEUTROABS 4.6 10/04/2014   HGB 8.9* 10/04/2014   HCT 26.9* 10/04/2014   MCV 84.6 10/04/2014  PLT 311 10/04/2014      Chemistry      Component Value Date/Time   NA 129* 10/04/2014 1059   NA 136 08/19/2012 0625   K 4.0 10/04/2014 1059   K 4.3 08/19/2012 0625   CL 98 11/15/2012 0941   CL 100 08/19/2012 0625   CO2 20* 10/04/2014 1059   CO2 28 08/19/2012 0625   BUN 10.6 10/04/2014 1059   BUN 9 08/19/2012 0625   CREATININE 1.0 10/04/2014 1059   CREATININE 0.69 08/19/2012 0625      Component Value Date/Time   CALCIUM 8.1* 10/04/2014 1059   CALCIUM 8.3* 08/20/2012 0542   ALKPHOS 86 10/04/2014 1059   ALKPHOS 57 08/19/2012 0625   AST 65* 10/04/2014 1059   AST 25 08/19/2012 0625   ALT 14 10/04/2014 1059   ALT 20 08/19/2012 0625   BILITOT 0.47 10/04/2014 1059    BILITOT 0.6 08/19/2012 0625      ASSESSMENT & PLAN:  Carcinoma of supraglottis He has significant decline in performance status and requested a chairlift. I am concerned that the treatment may not be working. We will proceed with treatment today as discussed below plan to reorder CT scan staging scans to be done before his treatment next week. In the meantime, we will continue on aggressive supportive care.   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 The patient denies recent history of bleeding such as epistaxis, hematuria or hematochezia. He is asymptomatic from the anemia. We will observe for now.  He does not require transfusion now.  He will get 1 unit of blood whenever hemoglobin dropped to less than 8 g    Hyponatremia The cause is unknown, could be due to poor nutritional intake versus SIADH from cancer. Clinically, he appears grossly dehydrated. I recommend IV fluids daily for the next 3 days to support him.   Protein calorie malnutrition He has significant protein calorie malnutrition due to cancer and inability to eat from nausea and vomiting. He follows with dietitian closely.   Chemotherapy induced nausea and vomiting The cause of his nausea and vomiting likely related to side effects of treatment, chronic constipation and possibly anticipatory nausea. I recommend a trial of Reglan 3 times a day before each meal and recommended IV fluid support daily.   Other constipation This is multifactorial. I recommend addition of lactulose on Miralax daily.    Orders Placed This Encounter  Procedures  . CT Chest W Contrast    Standing Status: Future     Number of Occurrences:      Standing Expiration Date: 12/04/2015    Order Specific Question:  Reason for Exam (SYMPTOM  OR DIAGNOSIS REQUIRED)    Answer:  staging metastatic supraglottic ca, assess response to Rx    Order Specific Question:  Preferred imaging location?     Answer:  [facility]  . CT Abdomen Pelvis W Contrast    Standing Status: Future     Number of Occurrences:      Standing Expiration Date: 01/04/2016    Order Specific Question:  Reason for Exam (SYMPTOM  OR DIAGNOSIS REQUIRED)    Answer:  staging metastatic supraglottic ca, assess response to Rx    Order Specific Question:  Preferred imaging location?    Answer:  [facility]   All questions were answered. The patient knows to call the clinic with any problems, questions or concerns. No barriers to learning was detected. I spent  30 minutes counseling the patient face to face. The total time spent in the appointment was 40 minutes and more than 50% was on counseling and review of test results     [facility], [place], MD 10/05/2014 12:28 PM

## 2014-10-05 NOTE — Telephone Encounter (Signed)
Faxed order for lift chair and office note to [place] Daughter notified

## 2014-10-05 NOTE — Assessment & Plan Note (Signed)
This is likely anemia of chronic disease and from bone mets. The patient denies recent history of bleeding such as epistaxis, hematuria or hematochezia. He is asymptomatic from the anemia. We will observe for now.  He does not require transfusion now.  He will get 1 unit of blood whenever hemoglobin dropped to less than 8 g

## 2014-10-05 NOTE — Assessment & Plan Note (Signed)
He has significant protein calorie malnutrition due to cancer and inability to eat from nausea and vomiting. He follows with dietitian closely.

## 2014-10-05 NOTE — Assessment & Plan Note (Signed)
This is multifactorial. I recommend addition of lactulose on Miralax daily.

## 2014-10-05 NOTE — Assessment & Plan Note (Signed)
The cause of his nausea and vomiting likely related to side effects of treatment, chronic constipation and possibly anticipatory nausea. I recommend a trial of Reglan 3 times a day before each meal and recommended IV fluid support daily.

## 2014-10-05 NOTE — Patient Instructions (Signed)
Dehydration, Adult Dehydration is when you lose more fluids from the body than you take in. Vital organs like the kidneys, brain, and heart cannot function without a proper amount of fluids and salt. Any loss of fluids from the body can cause dehydration.  CAUSES   Vomiting.  Diarrhea.  Excessive sweating.  Excessive urine output.  Fever. SYMPTOMS  Mild dehydration  Thirst.  Dry lips.  Slightly dry mouth. Moderate dehydration  Very dry mouth.  Sunken eyes.  Skin does not bounce back quickly when lightly pinched and released.  Dark urine and decreased urine production.  Decreased tear production.  Headache. Severe dehydration  Very dry mouth.  Extreme thirst.  Rapid, weak pulse (more than 100 beats per minute at rest).  Cold hands and feet.  Not able to sweat in spite of heat and temperature.  Rapid breathing.  Blue lips.  Confusion and lethargy.  Difficulty being awakened.  Minimal urine production.  No tears. DIAGNOSIS  Your caregiver will diagnose dehydration based on your symptoms and your exam. Blood and urine tests will help confirm the diagnosis. The diagnostic evaluation should also identify the cause of dehydration. TREATMENT  Treatment of mild or moderate dehydration can often be done at home by increasing the amount of fluids that you drink. It is best to drink small amounts of fluid more often. Drinking too much at one time can make vomiting worse. Refer to the home care instructions below. Severe dehydration needs to be treated at the hospital where you will probably be given intravenous (IV) fluids that contain water and electrolytes. HOME CARE INSTRUCTIONS   Ask your caregiver about specific rehydration instructions.  Drink enough fluids to keep your urine clear or pale yellow.  Drink small amounts frequently if you have nausea and vomiting.  Eat as you normally do.  Avoid:  Foods or drinks high in sugar.  Carbonated  drinks.  Juice.  Extremely hot or cold fluids.  Drinks with caffeine.  Fatty, greasy foods.  Alcohol.  Tobacco.  Overeating.  Gelatin desserts.  Wash your hands well to avoid spreading bacteria and viruses.  Only take over-the-counter or prescription medicines for pain, discomfort, or fever as directed by your caregiver.  Ask your caregiver if you should continue all prescribed and over-the-counter medicines.  Keep all follow-up appointments with your caregiver. SEEK MEDICAL CARE IF:  You have abdominal pain and it increases or stays in one area (localizes).  You have a rash, stiff neck, or severe headache.  You are irritable, sleepy, or difficult to awaken.  You are weak, dizzy, or extremely thirsty. SEEK IMMEDIATE MEDICAL CARE IF:   You are unable to keep fluids down or you get worse despite treatment.  You have frequent episodes of vomiting or diarrhea.  You have blood or green matter (bile) in your vomit.  You have blood in your stool or your stool looks black and tarry.  You have not urinated in 6 to 8 hours, or you have only urinated a small amount of very dark urine.  You have a fever.  You faint. MAKE SURE YOU:   Understand these instructions.  Will watch your condition.  Will get help right away if you are not doing well or get worse. Document Released: 05/26/2005 Document Revised: 08/18/2011 Document Reviewed: 01/13/2011 Exit[employer] Patient Information 2015 [employer]. This information is not intended to replace advice given to you by your health care provider. Make sure you discuss any questions you have with your health care   provider.  

## 2014-10-06 ENCOUNTER — Ambulatory Visit (HOSPITAL_BASED_OUTPATIENT_CLINIC_OR_DEPARTMENT_OTHER): Payer: Medicare Other

## 2014-10-06 ENCOUNTER — Telehealth: Payer: Self-pay | Admitting: *Deleted

## 2014-10-06 VITALS — BP 132/71 | HR 74 | Temp 97.6°F | Resp 18

## 2014-10-06 DIAGNOSIS — E46 Unspecified protein-calorie malnutrition: Secondary | ICD-10-CM | POA: Diagnosis not present

## 2014-10-06 DIAGNOSIS — C321 Malignant neoplasm of supraglottis: Secondary | ICD-10-CM

## 2014-10-06 DIAGNOSIS — R1115 Cyclical vomiting syndrome unrelated to migraine: Secondary | ICD-10-CM

## 2014-10-06 DIAGNOSIS — Z5189 Encounter for other specified aftercare: Secondary | ICD-10-CM | POA: Diagnosis present

## 2014-10-06 DIAGNOSIS — R112 Nausea with vomiting, unspecified: Secondary | ICD-10-CM | POA: Diagnosis not present

## 2014-10-06 MED ORDER — SODIUM CHLORIDE 0.9 % IV SOLN
Freq: Once | INTRAVENOUS | Status: AC
  Administered 2014-10-06: 16:00:00 via INTRAVENOUS
  Filled 2014-10-06: qty 4

## 2014-10-06 MED ORDER — SODIUM CHLORIDE 0.9 % IV SOLN
Freq: Once | INTRAVENOUS | Status: AC
  Administered 2014-10-06: 16:00:00 via INTRAVENOUS

## 2014-10-06 MED ORDER — SODIUM CHLORIDE 0.9 % IV SOLN: 10.0000 mg | Freq: Once | INTRAVENOUS | Status: DC

## 2014-10-06 MED ORDER — SODIUM CHLORIDE 0.9 % IJ SOLN
10.0000 mL | INTRAMUSCULAR | Status: DC | PRN
  Administered 2014-10-06: 10 mL
  Filled 2014-10-06: qty 10

## 2014-10-06 MED ORDER — HEPARIN SOD (PORK) LOCK FLUSH 100 UNIT/ML IV SOLN
500.0000 [IU] | Freq: Once | INTRAVENOUS | Status: AC | PRN
  Administered 2014-10-06: 500 [IU]
  Filled 2014-10-06: qty 5

## 2014-10-06 MED ORDER — SODIUM CHLORIDE 0.9 % IV SOLN: Freq: Once | INTRAVENOUS | Status: DC

## 2014-10-06 NOTE — Telephone Encounter (Signed)
Notified daughter of results below

## 2014-10-06 NOTE — Telephone Encounter (Signed)
-----   Message from Heath Lark, MD sent at 10/06/2014  7:46 AM EDT ----- Regarding: urine is negative Pls let daughter know no evidence of UTI

## 2014-10-06 NOTE — Patient Instructions (Signed)
Dehydration, Adult Dehydration is when you lose more fluids from the body than you take in. Vital organs like the kidneys, brain, and heart cannot function without a proper amount of fluids and salt. Any loss of fluids from the body can cause dehydration.  CAUSES   Vomiting.  Diarrhea.  Excessive sweating.  Excessive urine output.  Fever. SYMPTOMS  Mild dehydration  Thirst.  Dry lips.  Slightly dry mouth. Moderate dehydration  Very dry mouth.  Sunken eyes.  Skin does not bounce back quickly when lightly pinched and released.  Dark urine and decreased urine production.  Decreased tear production.  Headache. Severe dehydration  Very dry mouth.  Extreme thirst.  Rapid, weak pulse (more than 100 beats per minute at rest).  Cold hands and feet.  Not able to sweat in spite of heat and temperature.  Rapid breathing.  Blue lips.  Confusion and lethargy.  Difficulty being awakened.  Minimal urine production.  No tears. DIAGNOSIS  Your caregiver will diagnose dehydration based on your symptoms and your exam. Blood and urine tests will help confirm the diagnosis. The diagnostic evaluation should also identify the cause of dehydration. TREATMENT  Treatment of mild or moderate dehydration can often be done at home by increasing the amount of fluids that you drink. It is best to drink small amounts of fluid more often. Drinking too much at one time can make vomiting worse. Refer to the home care instructions below. Severe dehydration needs to be treated at the hospital where you will probably be given intravenous (IV) fluids that contain water and electrolytes. HOME CARE INSTRUCTIONS   Ask your caregiver about specific rehydration instructions.  Drink enough fluids to keep your urine clear or pale yellow.  Drink small amounts frequently if you have nausea and vomiting.  Eat as you normally do.  Avoid:  Foods or drinks high in sugar.  Carbonated  drinks.  Juice.  Extremely hot or cold fluids.  Drinks with caffeine.  Fatty, greasy foods.  Alcohol.  Tobacco.  Overeating.  Gelatin desserts.  Wash your hands well to avoid spreading bacteria and viruses.  Only take over-the-counter or prescription medicines for pain, discomfort, or fever as directed by your caregiver.  Ask your caregiver if you should continue all prescribed and over-the-counter medicines.  Keep all follow-up appointments with your caregiver. SEEK MEDICAL CARE IF:  You have abdominal pain and it increases or stays in one area (localizes).  You have a rash, stiff neck, or severe headache.  You are irritable, sleepy, or difficult to awaken.  You are weak, dizzy, or extremely thirsty. SEEK IMMEDIATE MEDICAL CARE IF:   You are unable to keep fluids down or you get worse despite treatment.  You have frequent episodes of vomiting or diarrhea.  You have blood or green matter (bile) in your vomit.  You have blood in your stool or your stool looks black and tarry.  You have not urinated in 6 to 8 hours, or you have only urinated a small amount of very dark urine.  You have a fever.  You faint. MAKE SURE YOU:   Understand these instructions.  Will watch your condition.  Will get help right away if you are not doing well or get worse. Document Released: 05/26/2005 Document Revised: 08/18/2011 Document Reviewed: 01/13/2011 Exit[employer] Patient Information 2015 [employer]. This information is not intended to replace advice given to you by your health care provider. Make sure you discuss any questions you have with your health care   provider.  

## 2014-10-10 ENCOUNTER — Ambulatory Visit (HOSPITAL_BASED_OUTPATIENT_CLINIC_OR_DEPARTMENT_OTHER): Payer: Medicare Other

## 2014-10-10 ENCOUNTER — Other Ambulatory Visit (HOSPITAL_BASED_OUTPATIENT_CLINIC_OR_DEPARTMENT_OTHER): Payer: Medicare Other

## 2014-10-10 ENCOUNTER — Encounter (HOSPITAL_COMMUNITY): Payer: Self-pay

## 2014-10-10 ENCOUNTER — Other Ambulatory Visit: Payer: Self-pay

## 2014-10-10 ENCOUNTER — Ambulatory Visit (HOSPITAL_COMMUNITY)
Admission: RE | Admit: 2014-10-10 | Discharge: 2014-10-10 | Disposition: A | Discharge status: Home / Self Care | Payer: Medicare Other | Source: Ambulatory Visit | Attending: Hematology and Oncology | Admitting: Hematology and Oncology

## 2014-10-10 VITALS — BP 91/64 | HR 90 | Temp 97.8°F | Resp 18

## 2014-10-10 DIAGNOSIS — Z95828 Presence of other vascular implants and grafts: Secondary | ICD-10-CM

## 2014-10-10 DIAGNOSIS — C321 Malignant neoplasm of supraglottis: Secondary | ICD-10-CM

## 2014-10-10 DIAGNOSIS — Z452 Encounter for adjustment and management of vascular access device: Secondary | ICD-10-CM | POA: Diagnosis not present

## 2014-10-10 DIAGNOSIS — C7951 Secondary malignant neoplasm of bone: Secondary | ICD-10-CM | POA: Insufficient documentation

## 2014-10-10 DIAGNOSIS — K5909 Other constipation: Secondary | ICD-10-CM

## 2014-10-10 DIAGNOSIS — D630 Anemia in neoplastic disease: Secondary | ICD-10-CM

## 2014-10-10 DIAGNOSIS — Z923 Personal history of irradiation: Secondary | ICD-10-CM | POA: Diagnosis not present

## 2014-10-10 LAB — CBC WITH DIFFERENTIAL/PLATELET
BASO%: 0.2 % (ref 0.0–2.0)
Basophils Absolute: 0 10*3/uL (ref 0.0–0.1)
EOS ABS: 0.1 10*3/uL (ref 0.0–0.5)
EOS%: 1.4 % (ref 0.0–7.0)
HCT: 25.3 % — ABNORMAL LOW (ref 38.4–49.9)
HGB: 8.4 g/dL — ABNORMAL LOW (ref 13.0–17.1)
LYMPH%: 10.2 % — ABNORMAL LOW (ref 14.0–49.0)
MCH: 27.9 pg (ref 27.2–33.4)
MCHC: 33.2 g/dL (ref 32.0–36.0)
MCV: 84.1 fL (ref 79.3–98.0)
MONO#: 0.5 10*3/uL (ref 0.1–0.9)
MONO%: 8.5 % (ref 0.0–14.0)
NEUT#: 5.1 10*3/uL (ref 1.5–6.5)
NEUT%: 79.7 % — ABNORMAL HIGH (ref 39.0–75.0)
Platelets: 313 10*3/uL (ref 140–400)
RBC: 3.01 10*6/uL — AB (ref 4.20–5.82)
RDW: 20.1 % — AB (ref 11.0–14.6)
WBC: 6.4 10*3/uL (ref 4.0–10.3)
lymph#: 0.7 10*3/uL — ABNORMAL LOW (ref 0.9–3.3)
nRBC: 0 % (ref 0–0)

## 2014-10-10 LAB — COMPREHENSIVE METABOLIC PANEL (CC13)
ALK PHOS: 81 U/L (ref 40–150)
ALT: 25 U/L (ref 0–55)
AST: 61 U/L — ABNORMAL HIGH (ref 5–34)
Albumin: 2.4 g/dL — ABNORMAL LOW (ref 3.5–5.0)
Anion Gap: 9 mEq/L (ref 3–11)
BILIRUBIN TOTAL: 0.6 mg/dL (ref 0.20–1.20)
BUN: 11.4 mg/dL (ref 7.0–26.0)
CO2: 22 mEq/L (ref 22–29)
CREATININE: 0.7 mg/dL (ref 0.7–1.3)
Calcium: 8.2 mg/dL — ABNORMAL LOW (ref 8.4–10.4)
Chloride: 101 mEq/L (ref 98–109)
EGFR: >90 mL/min/{1.73_m2} (ref >90)
Glucose: 94 mg/dl (ref 70–140)
Potassium: 3.8 mEq/L (ref 3.5–5.1)
Sodium: 131 mEq/L — ABNORMAL LOW (ref 136–145)
TOTAL PROTEIN: 6.9 g/dL (ref 6.4–8.3)

## 2014-10-10 LAB — HOLD TUBE, BLOOD BANK

## 2014-10-10 MED ORDER — SODIUM CHLORIDE 0.9 % IJ SOLN
10.0000 mL | INTRAMUSCULAR | Status: DC | PRN
  Administered 2014-10-10: 10 mL via INTRAVENOUS
  Filled 2014-10-10: qty 10

## 2014-10-10 MED ORDER — HEPARIN SOD (PORK) LOCK FLUSH 100 UNIT/ML IV SOLN
500.0000 [IU] | Freq: Once | INTRAVENOUS | Status: AC
  Administered 2014-10-10: 500 [IU] via INTRAVENOUS
  Filled 2014-10-10: qty 5

## 2014-10-10 MED ORDER — IOHEXOL 300 MG/ML  SOLN
100.0000 mL | Freq: Once | INTRAMUSCULAR | Status: AC | PRN
  Administered 2014-10-10: 100 mL via INTRAVENOUS

## 2014-10-10 NOTE — Patient Instructions (Signed)

## 2014-10-11 ENCOUNTER — Ambulatory Visit: Payer: Medicare Other

## 2014-10-11 ENCOUNTER — Encounter: Payer: Self-pay | Admitting: Hematology and Oncology

## 2014-10-11 ENCOUNTER — Other Ambulatory Visit: Payer: Self-pay

## 2014-10-11 ENCOUNTER — Ambulatory Visit (HOSPITAL_BASED_OUTPATIENT_CLINIC_OR_DEPARTMENT_OTHER): Payer: Medicare Other | Admitting: Hematology and Oncology

## 2014-10-11 ENCOUNTER — Ambulatory Visit: Payer: Medicare Other | Admitting: Nutrition

## 2014-10-11 ENCOUNTER — Telehealth: Payer: Self-pay | Admitting: *Deleted

## 2014-10-11 VITALS — BP 114/64 | HR 95 | Temp 97.4°F | Resp 18 | Ht 69.0 in | Wt 112.3 lb

## 2014-10-11 DIAGNOSIS — K5909 Other constipation: Secondary | ICD-10-CM | POA: Diagnosis not present

## 2014-10-11 DIAGNOSIS — D630 Anemia in neoplastic disease: Secondary | ICD-10-CM | POA: Diagnosis not present

## 2014-10-11 DIAGNOSIS — C321 Malignant neoplasm of supraglottis: Secondary | ICD-10-CM

## 2014-10-11 DIAGNOSIS — E46 Unspecified protein-calorie malnutrition: Secondary | ICD-10-CM | POA: Diagnosis not present

## 2014-10-11 NOTE — Progress Notes (Signed)
Nutrition follow-up completed with patient and daughter. Patient reports appetite has improved. He appears to eat the best first thing in the morning.  He is now drinking Ensure Plus or boost plus twice a day. His daughter continues to provide food as desired. Weight documented as 112 pounds down 2 pounds from April.  Nutrition diagnosis: Malnutrition continues.  Intervention: Patient will try to increase oral nutrition supplements 3 times a day Patient encouraged to consume smaller more frequent meals throughout the day to stimulate appetite. Questions answered.  Teach back method used.  Monitoring, evaluation, goals: Patient will work to increase oral nutrition supplements 3 times a day  Next visit: To be scheduled.  **Disclaimer: This note was dictated with voice recognition software. Similar sounding words can inadvertently be transcribed and this note may contain transcription errors which may not have been corrected upon publication of note.**

## 2014-10-11 NOTE — Telephone Encounter (Signed)
Referral made to [facility] for Hospice home care.  S/w Stacey in referral center.

## 2014-10-12 NOTE — Assessment & Plan Note (Signed)
Unfortunately, he has disease progression. His performance status is very poor. At this juncture, I recommend focus on palliative care and hospice. He agreed with the plan of care. I would discontinue his chemotherapy.

## 2014-10-12 NOTE — Assessment & Plan Note (Signed)
This is related to significant disease infiltration. He does not need blood transfusion today. As above, I will enroll him to hospice care.

## 2014-10-12 NOTE — Progress Notes (Signed)
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     07/09/2012 Initial Diagnosis Carcinoma of supraglottis from biopsy, HPV negative   08/13/2012 Surgery He underwent laryngectomy and bilateral LN dissection. There were bilateral LN involvement and 10/25 LN were positive with extracapsular extension staging T3N2Mx   09/20/2012 - 10/29/2012 Chemotherapy he received concurrent chemo/Rt with weekly cisplatin   02/23/2013 Imaging Staging PET/Ct scan showed focal hypermetabolic bone lesions from metastatic cancer   03/09/2013 Procedure Placement of infusaport under IR   03/15/2013 - 07/19/2013 Chemotherapy Start cycle 1 palliative chemotherapy with Weekly Carboplatin & Paclitaxel with mild dosage adjustment due to elevated creatinine. Chemotherapy was subsequently discontinued as [name]   05/03/2013 Imaging Repeat PEt/CT showed no evidence of local laryngeal squamous cell carcinoma in the neck & interval resolution of metabolic activity associated skeletal metastases.    05/10/2013 Adverse Reaction Dose of Paclitxel was reduced by 20% due to worsening neuropathy   07/15/2013 Imaging Repeat PET CT scan show resolution of his disease in the bones. There is mild  inflammatory/infectious process in his lungs but the patient is not symptomatic   10/14/2013 Imaging CT scan show no evidence of disease progression.   04/14/2014 Imaging repeat CT scan show no evidence of disease progression   07/27/2014 Imaging CT scan of the neck and the chest showed no lymphadenopathy, lung nodule and bone metastasis   08/09/2014 - 10/04/2014 Chemotherapy He received weekly Paclitaxel   10/10/2014 Imaging CT scan showed disease progression. Chemotherapy is stopped and hospice is recommended    INTERVAL HISTORY: Please see below for problem oriented charting. He returns for further follow-up. Since he was seen last week, he is feeling a little better. Nausea was under control. He denies significant pain. Constipation resolved with laxative. He continues to feel weak.  REVIEW OF SYSTEMS:   Constitutional: Denies fevers, chills or abnormal weight loss Eyes: Denies blurriness of vision Ears, nose, mouth, throat, and face: Denies mucositis or sore throat Respiratory: Denies cough, dyspnea or wheezes Cardiovascular: Denies palpitation, chest discomfort or lower extremity swelling Skin: Denies abnormal skin rashes Lymphatics: Denies new lymphadenopathy or easy bruising Neurological:Denies numbness, tingling  Behavioral/Psych: Mood is stable, no new changes  All other systems were reviewed with the patient and are negative.  I have reviewed the past medical history, past surgical history, social history and family history with the patient and they are unchanged from previous note.  ALLERGIES:  has No Known Allergies.  MEDICATIONS:  Current Outpatient Prescriptions  Medication Sig Dispense Refill  . acetaminophen (TYLENOL) 325 MG tablet Take 650 mg by mouth daily as needed for pain.    Marland Kitchen lactulose (CHRONULAC) 10 GM/15ML solution Take 30 mLs (20 g total) by mouth 3 (three) times daily. 240 mL 5  . metoCLOPramide (REGLAN) 10  MG tablet Take 1 tablet (10 mg total) by mouth 4  (four) times daily -  before meals and at bedtime. 90 tablet 3  . Multiple Vitamin (MULTIVITAMIN) tablet Take 1 tablet by mouth daily.    . ondansetron (ZOFRAN) 8 MG tablet Take 1 tablet (8 mg total) by mouth every 8 (eight) hours as needed for nausea. 60 tablet 3  . oxyCODONE (OXY IR/ROXICODONE) 5 MG immediate release tablet Take 1 tablet (5 mg total) by mouth every 4 (four) hours as needed for severe pain. 30 tablet 0  . polyethylene glycol (MIRALAX) packet Take 17 g by mouth daily. 14 each 0  . prochlorperazine (COMPAZINE) 10 MG tablet Take 1 tablet (10 mg total) by mouth every 6 (six) hours as needed for nausea or vomiting. 60 tablet 6  . levothyroxine (SYNTHROID, LEVOTHROID) 75 MCG tablet Take 1 tablet (75 mcg total) by mouth daily before breakfast. 30 tablet 6   No current facility-administered medications for this visit.    PHYSICAL EXAMINATION: ECOG PERFORMANCE STATUS: 2 - Symptomatic, <50% confined to bed  Filed Vitals:   10/11/14 1052  BP: 114/64  Pulse: 95  Temp: 97.4 F (36.3 C)  Resp: 18   Filed Weights   10/11/14 1052  Weight: 112 lb 4.8 oz (50.939 kg)    GENERAL:alert, no distress and comfortable SKIN: skin color, texture, turgor are normal, no rashes or significant lesions EYES: normal, Conjunctiva are pink and non-injected, sclera clear OROPHARYNX:no exudate, no erythema and lips, buccal mucosa, and tongue normal  NECK: Tracheostomy site looks okay.  Musculoskeletal:no cyanosis of digits and no clubbing  NEURO: alert & oriented x 3 with fluent speech, no focal motor/sensory deficits  LABORATORY DATA:  I have reviewed the data as listed    Component Value Date/Time   NA 131* 10/10/2014 1232   NA 136 08/19/2012 0625   K 3.8 10/10/2014 1232   K 4.3 08/19/2012 0625   CL 98 11/15/2012 0941   CL 100 08/19/2012 0625   CO2 22 10/10/2014 1232   CO2 28 08/19/2012 0625   GLUCOSE 94 10/10/2014 1232   GLUCOSE 90 11/15/2012 0941   GLUCOSE 109* 08/19/2012 0625    BUN 11.4 10/10/2014 1232   BUN 9 08/19/2012 0625   CREATININE 0.7 10/10/2014 1232   CREATININE 0.69 08/19/2012 0625   CALCIUM 8.2* 10/10/2014 1232   CALCIUM 8.3* 08/20/2012 0542   PROT 6.9 10/10/2014 1232   PROT 6.9 08/19/2012 0625   ALBUMIN 2.4* 10/10/2014 1232   ALBUMIN 2.4* 08/19/2012 0625   AST 61* 10/10/2014 1232   AST 25 08/19/2012 0625   ALT 25 10/10/2014 1232   ALT 20 08/19/2012 0625   ALKPHOS 81 10/10/2014 1232   ALKPHOS 57 08/19/2012 0625   BILITOT 0.60 10/10/2014 1232   BILITOT 0.6 08/19/2012 0625   GFRNONAA >90 08/19/2012 0625   GFRAA >90 08/19/2012 0625    No results found for: SPEP, UPEP  Lab Results  Component Value Date   WBC 6.4 10/10/2014   NEUTROABS 5.1 10/10/2014   HGB 8.4* 10/10/2014   HCT 25.3* 10/10/2014   MCV 84.1 10/10/2014   PLT 313 10/10/2014      Chemistry      Component Value Date/Time   NA 131* 10/10/2014 1232   NA 136 08/19/2012 0625   K 3.8 10/10/2014 1232   K 4.3 08/19/2012 0625   CL 98 11/15/2012 0941   CL 100 08/19/2012 0625   CO2 22 10/10/2014 1232   CO2 28  08/19/2012 0625   BUN 11.4 10/10/2014 1232   BUN 9 08/19/2012 0625   CREATININE 0.7 10/10/2014 1232   CREATININE 0.69 08/19/2012 0625      Component Value Date/Time   CALCIUM 8.2* 10/10/2014 1232   CALCIUM 8.3* 08/20/2012 0542   ALKPHOS 81 10/10/2014 1232   ALKPHOS 57 08/19/2012 0625   AST 61* 10/10/2014 1232   AST 25 08/19/2012 0625   ALT 25 10/10/2014 1232   ALT 20 08/19/2012 0625   BILITOT 0.60 10/10/2014 1232   BILITOT 0.6 08/19/2012 0625       RADIOGRAPHIC STUDIES: I have personally reviewed the radiological images as listed and agreed with the findings in the report. Ct Chest W Contrast  10/10/2014   CLINICAL DATA:  Supraglottic carcinoma with osseous metastatic disease. Radiation therapy completed 2 years ago. Chemotherapy ongoing. Subsequent encounter.  EXAM: CT CHEST, ABDOMEN, AND PELVIS WITH CONTRAST  TECHNIQUE: Multidetector CT imaging of the chest,  abdomen and pelvis was performed following the standard protocol during bolus administration of intravenous contrast.  CONTRAST:  133mL OMNIPAQUE IOHEXOL 300 MG/ML  SOLN  COMPARISON:  CTs 07/27/2014 and 08/14/2014. Whole body bone scan 08/17/2014. PET-CT 07/15/2013.  FINDINGS: CT CHEST FINDINGS  Mediastinum/Nodes: Postsurgical changes are again noted in the lower neck status post tracheostomy. The peripherally enhancing node laterally in the right supraclavicular fossa is unchanged, measuring 12 mm on image 9. There are no enlarged mediastinal, hilar or axillary lymph nodes. The heart size is normal. There is no pericardial effusion.There is diffuse atherosclerosis of the aorta, great vessels and coronary arteries.  Lungs/Pleura: There is no pleural effusion.Chronic radiation changes at both lung apices with subpleural scarring, air bronchograms and volume loss are similar to the prior study. There are chronic postinflammatory changes more inferiorly in the right upper lobe with associated bronchiectasis. No suspicious pulmonary nodules identified.  Musculoskeletal/Chest wall: Diffuse muscular atrophy throughout the right chest wall again noted. There is widespread osseous metastatic disease with multiple blastic lesions throughout the spine, ribs and sternum. No pathologic fracture identified. The soft tissue nodularity around the sternum noted previously has nearly completely resolved.  CT ABDOMEN AND PELVIS FINDINGS  Hepatobiliary: The liver is normal in density without focal abnormality. No evidence of gallstones, gallbladder wall thickening or biliary dilatation.  Pancreas: Unremarkable. No pancreatic ductal dilatation or surrounding inflammatory changes.  Spleen: Normal in size without focal abnormality.  Adrenals/Urinary Tract: Both adrenal glands appear normal.Multiple renal cysts are again noted bilaterally. 1.6 cm hyperdense lesion in the upper pole of the right kidney on image 69 corresponds with a  probable hemorrhagic cyst on prior PET-CT. No hydronephrosis or bladder abnormality seen.  Stomach/Bowel: No evidence of bowel wall thickening, distention or surrounding inflammatory change.No ascites or extraluminal fluid collection identified.  Vascular/Lymphatic: Again demonstrated is extensive partially necrotic retroperitoneal lymphadenopathy. Most of these are unchanged to slightly worse. Representative nodes include a left periaortic nodal mass measuring 3.6 x 2.3 cm on image 78 (previously 3.1 x 2.0 cm), and a large right pelvic side wall nodal mass measuring 5.5 x 3.3 cm on image 105 (previously 5.6 x 3.5 cm). The large necrotic mass involving the left sciatic notch measures 6.3 x 5.1 cm on image 106 (previously 5.8 x 4.4 cm). This mass invades the sacrum with associated sacral epidural tumor. Right inguinal adenopathy appears unchanged. Diffuse aortoiliac atherosclerosis again noted. No large vessel occlusion identified.  Reproductive: Unremarkable.  Other: No evidence of abdominal wall mass or hernia.  Musculoskeletal: Extensive osseous metastatic  disease again noted. There may be a small amount of epidural tumor on the left at T10-11. No pathologic fracture identified in the spine. As above, the mass within the left sciatic notch invades and partially destroys the sacrum. There is partial destruction of the right iliac bone and posterior acetabulum. The soft tissue component associated with the posterior right acetabular lesion has slightly improved.  IMPRESSION: 1. Slow progression of bulky partial necrotic retroperitoneal and pelvic lymphadenopathy consistent with worsening metastatic disease. 2. Diffuse osseous metastatic disease. 3. No evidence of solid organ parenchymal metastases. No definite pulmonary metastases. 4. Stable right lateral supraclavicular lymph node.   Electronically Signed   By: Richardean Sale M.D.   On: 10/10/2014 16:19   Ct Abdomen Pelvis W Contrast  10/10/2014   CLINICAL DATA:   Supraglottic carcinoma with osseous metastatic disease. Radiation therapy completed 2 years ago. Chemotherapy ongoing. Subsequent encounter.  EXAM: CT CHEST, ABDOMEN, AND PELVIS WITH CONTRAST  TECHNIQUE: Multidetector CT imaging of the chest, abdomen and pelvis was performed following the standard protocol during bolus administration of intravenous contrast.  CONTRAST:  187mL OMNIPAQUE IOHEXOL 300 MG/ML  SOLN  COMPARISON:  CTs 07/27/2014 and 08/14/2014. Whole body bone scan 08/17/2014. PET-CT 07/15/2013.  FINDINGS: CT CHEST FINDINGS  Mediastinum/Nodes: Postsurgical changes are again noted in the lower neck status post tracheostomy. The peripherally enhancing node laterally in the right supraclavicular fossa is unchanged, measuring 12 mm on image 9. There are no enlarged mediastinal, hilar or axillary lymph nodes. The heart size is normal. There is no pericardial effusion.There is diffuse atherosclerosis of the aorta, great vessels and coronary arteries.  Lungs/Pleura: There is no pleural effusion.Chronic radiation changes at both lung apices with subpleural scarring, air bronchograms and volume loss are similar to the prior study. There are chronic postinflammatory changes more inferiorly in the right upper lobe with associated bronchiectasis. No suspicious pulmonary nodules identified.  Musculoskeletal/Chest wall: Diffuse muscular atrophy throughout the right chest wall again noted. There is widespread osseous metastatic disease with multiple blastic lesions throughout the spine, ribs and sternum. No pathologic fracture identified. The soft tissue nodularity around the sternum noted previously has nearly completely resolved.  CT ABDOMEN AND PELVIS FINDINGS  Hepatobiliary: The liver is normal in density without focal abnormality. No evidence of gallstones, gallbladder wall thickening or biliary dilatation.  Pancreas: Unremarkable. No pancreatic ductal dilatation or surrounding inflammatory changes.  Spleen: Normal  in size without focal abnormality.  Adrenals/Urinary Tract: Both adrenal glands appear normal.Multiple renal cysts are again noted bilaterally. 1.6 cm hyperdense lesion in the upper pole of the right kidney on image 69 corresponds with a probable hemorrhagic cyst on prior PET-CT. No hydronephrosis or bladder abnormality seen.  Stomach/Bowel: No evidence of bowel wall thickening, distention or surrounding inflammatory change.No ascites or extraluminal fluid collection identified.  Vascular/Lymphatic: Again demonstrated is extensive partially necrotic retroperitoneal lymphadenopathy. Most of these are unchanged to slightly worse. Representative nodes include a left periaortic nodal mass measuring 3.6 x 2.3 cm on image 78 (previously 3.1 x 2.0 cm), and a large right pelvic side wall nodal mass measuring 5.5 x 3.3 cm on image 105 (previously 5.6 x 3.5 cm). The large necrotic mass involving the left sciatic notch measures 6.3 x 5.1 cm on image 106 (previously 5.8 x 4.4 cm). This mass invades the sacrum with associated sacral epidural tumor. Right inguinal adenopathy appears unchanged. Diffuse aortoiliac atherosclerosis again noted. No large vessel occlusion identified.  Reproductive: Unremarkable.  Other: No evidence of abdominal wall mass  or hernia.  Musculoskeletal: Extensive osseous metastatic disease again noted. There may be a small amount of epidural tumor on the left at T10-11. No pathologic fracture identified in the spine. As above, the mass within the left sciatic notch invades and partially destroys the sacrum. There is partial destruction of the right iliac bone and posterior acetabulum. The soft tissue component associated with the posterior right acetabular lesion has slightly improved.  IMPRESSION: 1. Slow progression of bulky partial necrotic retroperitoneal and pelvic lymphadenopathy consistent with worsening metastatic disease. 2. Diffuse osseous metastatic disease. 3. No evidence of solid organ  parenchymal metastases. No definite pulmonary metastases. 4. Stable right lateral supraclavicular lymph node.   Electronically Signed   By: Richardean Sale M.D.   On: 10/10/2014 16:19     ASSESSMENT & PLAN:  Carcinoma of supraglottis Unfortunately, he has disease progression. His performance status is very poor. At this juncture, I recommend focus on palliative care and hospice. He agreed with the plan of care. I would discontinue his chemotherapy.   Anemia in neoplastic disease This is related to significant disease infiltration. He does not need blood transfusion today. As above, I will enroll him to hospice care.   Protein calorie malnutrition He has lost a lot of weight recently. I recommend he continue on soft diet as tolerated. We will get palliative care and hospice involved.   Other constipation Constipation has resolved with aggressive laxative therapy. I recommend we continue the same.    No orders of the defined types were placed in this encounter.   All questions were answered. The patient knows to call the clinic with any problems, questions or concerns. No barriers to learning was detected. I spent 25 minutes counseling the patient face to face. The total time spent in the appointment was 30 minutes and more than 50% was on counseling and review of test results     [facility], [place], MD 10/12/2014 8:06 AM

## 2014-10-12 NOTE — Assessment & Plan Note (Signed)
He has lost a lot of weight recently. I recommend he continue on soft diet as tolerated. We will get palliative care and hospice involved.

## 2014-10-12 NOTE — Assessment & Plan Note (Signed)
Constipation has resolved with aggressive laxative therapy. I recommend we continue the same.

## 2014-10-13 ENCOUNTER — Other Ambulatory Visit: Payer: Self-pay

## 2014-10-13 IMAGING — PT NM PET TUM IMG INITIAL (PI) SKULL BASE T - THIGH
6 series · 25 of 25 positions shown · non-contrast
Comparison: CT neck dated 07/03/2012

CLINICAL DATA: Initial treatment strategy for laryngeal cancer.

NUCLEAR MEDICINE PET SKULL BASE TO THIGH
Fasting Blood Glucose:  93
TECHNIQUE: 18.7 mCi F-18 FDG was injected intravenously. CT data
was obtained and used for attenuation correction and anatomic
localization only.  (This was not acquired as a diagnostic CT
examination.) Additional exam technical data entered on
technologist worksheet.

[Series 1: pet ac · axial · 3.3mm · 4.69mm/px · z∈[-881,-11]mm · 5 of 267 slices shown]
[im 1/267]
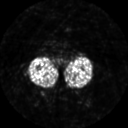
[im 67/267]
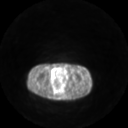
[im 134/267]
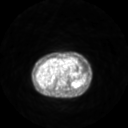
[im 200/267]
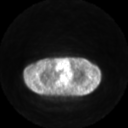
[im 267/267]
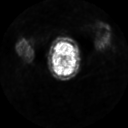

[Series 2: pet nac · axial · 3.3mm · 4.69mm/px · z∈[-881,-11]mm · 6 of 267 slices shown]
[im 1/267]
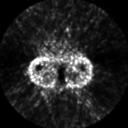
[im 54/267]
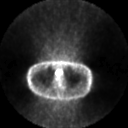
[im 107/267]
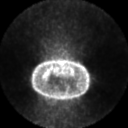
[im 160/267]
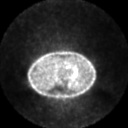
[im 213/267]
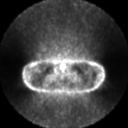
[im 267/267]
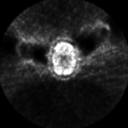

[Series 2: ct images · axial · 3.8mm · 0.98mm/px · z∈[-881,-11]mm · 6 of 267 slices shown]
[im 1/267]
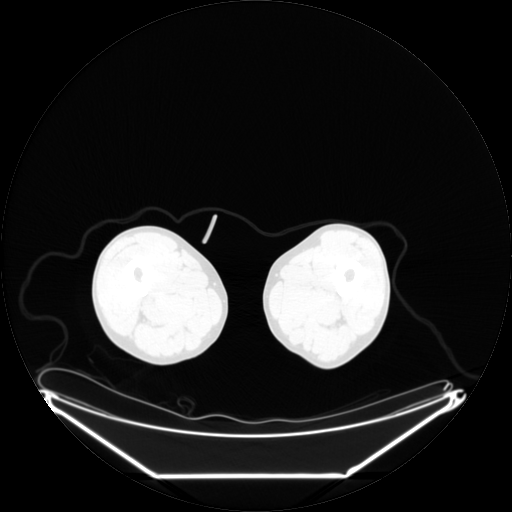
[im 54/267]
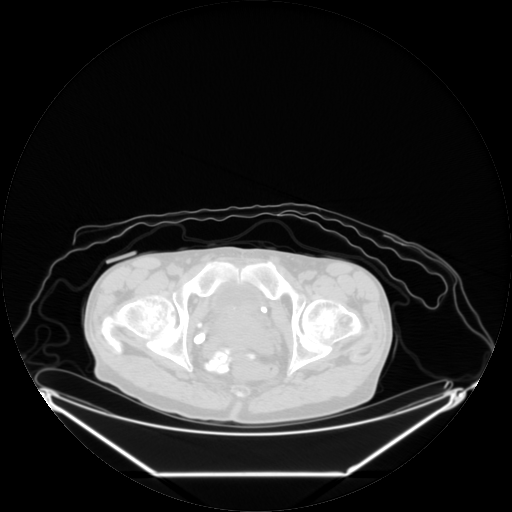
[im 107/267]
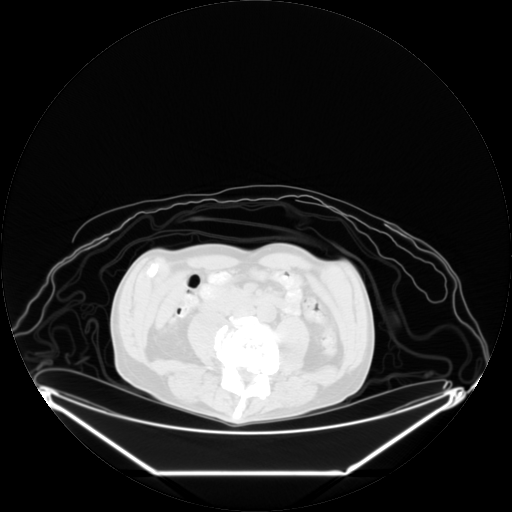
[im 160/267]
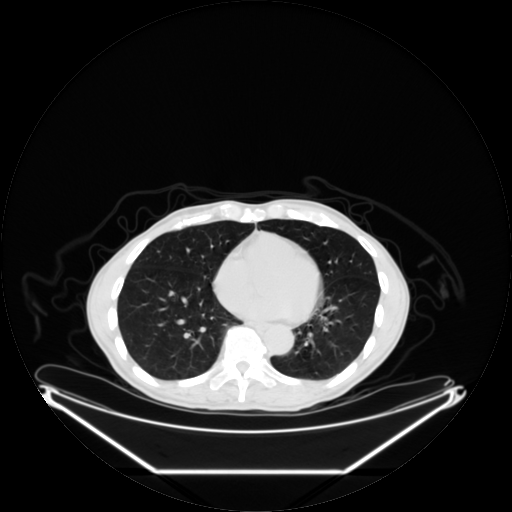
[im 213/267]
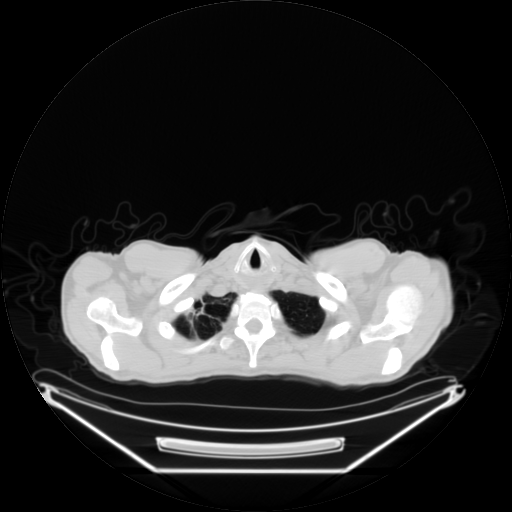
[im 267/267  brain]
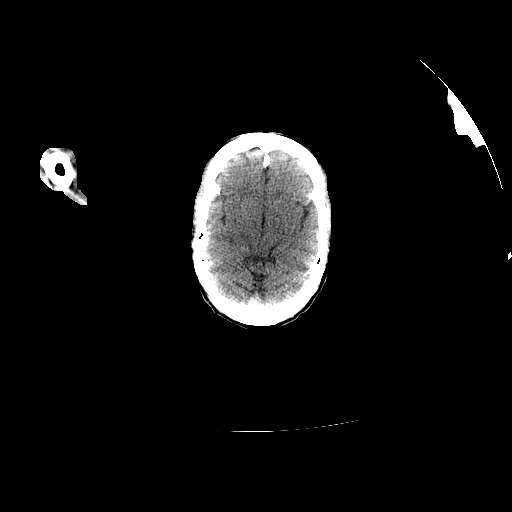

[Series 123: mip · coronal · 3.3mm · 4.69mm/px · 1 of 30 slices shown]
[im 1/30]
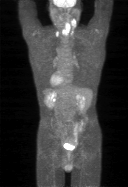

[Series 151: reformatted · axial · 3.3mm · 3.91mm/px · z∈[-881,-11]mm · 6 of 265 slices shown (1 of 2)]
[im 1/265]
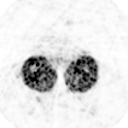
[im 53/265]
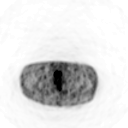
[im 106/265]
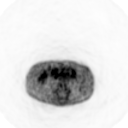
[im 159/265]
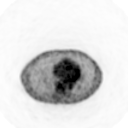
[im 212/265]
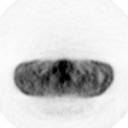
[im 265/265]
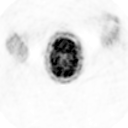

[Series 153: reformatted · coronal · 4.7mm · 6.98mm/px · 1 of 69 slices shown (2 of 2)]
[im 1/69]
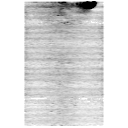

[25 of 25 positions shown; findings below may reference images not displayed]

FINDINGS: Neck: Large laryngeal mass extending superiorly along the right,
max SUV 22.4, corresponding to known squamous cell cancer.

Necrotic right level II/III lymph nodes, max SUV 18.6.  8 mm short-
mm short-axis low left level III node (series 2/image 66), max SUV
11.1.

6 mm cutaneous/subcutaneous lesion just lateral to the left
mandible (series 2/image 45), max SUV 8.2, favored to reflect
infection although tumor cannot be excluded.

Focal hypermetabolism in the right maxilla, max SUV 6.2 (PET image
29), likely reflecting an apical abscess of the right upper first
molar.

Chest:  No hypermetabolic mediastinal or hilar nodes.

Scarring in the right lung apex (series 2/image 24).  No associated
hypermetabolism.

No suspicious pulmonary nodules on the CT scan.

Coronary atherosclerosis.

Abdomen/Pelvis:  No abnormal hypermetabolic activity within the
liver, pancreas, adrenal glands, or spleen.

Bilateral renal cysts, including a 1.9 x 1.3 cm
hyperdense/hemorrhagic interpolar right renal cyst (series 2/image
148) and a 2.6 x 2.2 cm interpolar left renal cyst (series 2/image
149).

No hypermetabolic lymph nodes in the abdomen or pelvis.

Skeleton:  No focal hypermetabolic activity to suggest skeletal
metastasis.

Degenerative changes of the visualized thoracolumbar spine.
IMPRESSION: Large laryngeal mass, max SUV 22.4, corresponding to known squamous
cell cancer.

Right level II/III cervical nodal metastases, max SUV 18.6.

13 mm short-axis left low level III nodal metastasis, max SUV 11.1.

6 mm cutaneous/subcutaneous lesion just lateral to the left
mandible, max SUV 8.2, favored to reflect infection although tumor
cannot be excluded.

## 2014-10-14 ENCOUNTER — Other Ambulatory Visit: Payer: Self-pay | Admitting: Hematology and Oncology

## 2014-10-16 ENCOUNTER — Ambulatory Visit: Payer: Self-pay | Admitting: Hematology and Oncology

## 2014-10-18 ENCOUNTER — Ambulatory Visit: Payer: Self-pay

## 2014-10-18 ENCOUNTER — Other Ambulatory Visit: Payer: Self-pay

## 2014-10-20 ENCOUNTER — Telehealth: Payer: Self-pay | Admitting: *Deleted

## 2014-10-20 NOTE — Telephone Encounter (Signed)
Just let me know what i need to do

## 2014-10-20 NOTE — Telephone Encounter (Signed)
TC to [facility] re: lift chair. Spoke with representative in DME dept and since pt has Hospice care now, that particular contract covers a recliner/gerichair, not a lift chair.  TC to [facility] and explained that to her. She explained that her father was becoming weaker and weaker but that he just did not want to stay in the bed. The gerichair/recliner is an option and if she decides that they would like to try it, they would need to contact their hospice nurse to order.  [facility] does have lift chairs for purchase (around $600) but not for rental as they are upholstered, therefore not able to be adequately cleaned for re-rental.   Solmon Ice will speak to her father about what he would like to do at this point. Felicia knows to call with any further questions/concerns

## 2014-10-20 NOTE — Telephone Encounter (Signed)
TC from daughter, Solmon Ice requesting to proceed with lift chair request.  Apparently [facility] has the necessary information. However, since the request was made, pt was started with Hospice. Felicia was hoping hospice could get them the lift chair but that item is not covered by hospice Services. Can you please facilitate.

## 2014-10-24 ENCOUNTER — Telehealth: Payer: Self-pay | Admitting: *Deleted

## 2014-10-24 NOTE — Telephone Encounter (Signed)
Hospice RN asks if pt is supposed to be on lovenox?  She saw a note about lovenox she thought on his chart?  Per Dr. Alvy Bimler,  No history of being on lovenox or needing lovenox that she knows.  Informed Marianna Payment, RN w/ [facility].

## 2014-11-02 ENCOUNTER — Encounter (HOSPITAL_COMMUNITY): Payer: Self-pay | Admitting: Emergency Medicine

## 2014-11-02 ENCOUNTER — Emergency Department (HOSPITAL_COMMUNITY)

## 2014-11-02 ENCOUNTER — Inpatient Hospital Stay (HOSPITAL_COMMUNITY)
Admission: EM | Admit: 2014-11-02 | Discharge: 2014-11-08 | DRG: 389 | Disposition: E | Attending: Internal Medicine | Admitting: Internal Medicine

## 2014-11-02 DIAGNOSIS — G622 Polyneuropathy due to other toxic agents: Secondary | ICD-10-CM | POA: Diagnosis present

## 2014-11-02 DIAGNOSIS — Z87891 Personal history of nicotine dependence: Secondary | ICD-10-CM

## 2014-11-02 DIAGNOSIS — Z515 Encounter for palliative care: Secondary | ICD-10-CM | POA: Diagnosis not present

## 2014-11-02 DIAGNOSIS — Z9221 Personal history of antineoplastic chemotherapy: Secondary | ICD-10-CM | POA: Diagnosis not present

## 2014-11-02 DIAGNOSIS — Z66 Do not resuscitate: Secondary | ICD-10-CM | POA: Diagnosis present

## 2014-11-02 DIAGNOSIS — Z8042 Family history of malignant neoplasm of prostate: Secondary | ICD-10-CM | POA: Diagnosis not present

## 2014-11-02 DIAGNOSIS — Z79899 Other long term (current) drug therapy: Secondary | ICD-10-CM | POA: Diagnosis not present

## 2014-11-02 DIAGNOSIS — Z930 Tracheostomy status: Secondary | ICD-10-CM

## 2014-11-02 DIAGNOSIS — Z8611 Personal history of tuberculosis: Secondary | ICD-10-CM

## 2014-11-02 DIAGNOSIS — Z8521 Personal history of malignant neoplasm of larynx: Secondary | ICD-10-CM | POA: Diagnosis not present

## 2014-11-02 DIAGNOSIS — K5660 Unspecified intestinal obstruction: Secondary | ICD-10-CM | POA: Diagnosis not present

## 2014-11-02 DIAGNOSIS — C799 Secondary malignant neoplasm of unspecified site: Secondary | ICD-10-CM | POA: Diagnosis present

## 2014-11-02 DIAGNOSIS — C100 Malignant neoplasm of vallecula: Secondary | ICD-10-CM | POA: Diagnosis present

## 2014-11-02 DIAGNOSIS — Z923 Personal history of irradiation: Secondary | ICD-10-CM

## 2014-11-02 DIAGNOSIS — C329 Malignant neoplasm of larynx, unspecified: Secondary | ICD-10-CM | POA: Diagnosis present

## 2014-11-02 DIAGNOSIS — Z79891 Long term (current) use of opiate analgesic: Secondary | ICD-10-CM | POA: Diagnosis not present

## 2014-11-02 DIAGNOSIS — R112 Nausea with vomiting, unspecified: Secondary | ICD-10-CM | POA: Diagnosis not present

## 2014-11-02 DIAGNOSIS — E860 Dehydration: Secondary | ICD-10-CM | POA: Diagnosis not present

## 2014-11-02 DIAGNOSIS — K5649 Other impaction of intestine: Secondary | ICD-10-CM | POA: Diagnosis not present

## 2014-11-02 DIAGNOSIS — K56609 Unspecified intestinal obstruction, unspecified as to partial versus complete obstruction: Secondary | ICD-10-CM | POA: Diagnosis present

## 2014-11-02 DIAGNOSIS — Z4659 Encounter for fitting and adjustment of other gastrointestinal appliance and device: Secondary | ICD-10-CM

## 2014-11-02 DIAGNOSIS — T451X5S Adverse effect of antineoplastic and immunosuppressive drugs, sequela: Secondary | ICD-10-CM

## 2014-11-02 LAB — GLUCOSE, CAPILLARY: Glucose-Capillary: 130 mg/dL — ABNORMAL HIGH (ref 65–99)

## 2014-11-02 LAB — MRSA PCR SCREENING: MRSA by PCR: POSITIVE — AB

## 2014-11-02 MED ORDER — HYDROMORPHONE HCL 1 MG/ML IJ SOLN
1.0000 mg | Freq: Once | INTRAMUSCULAR | Status: AC
  Administered 2014-11-02: 1 mg via INTRAVENOUS
  Filled 2014-11-02: qty 1

## 2014-11-02 MED ORDER — DEXTROSE 5 % IN LACTATED RINGERS IV BOLUS
1000.0000 mL | Freq: Once | INTRAVENOUS | Status: AC
  Administered 2014-11-02: 1000 mL via INTRAVENOUS

## 2014-11-02 MED ORDER — ONDANSETRON HCL 4 MG/2ML IJ SOLN
4.0000 mg | Freq: Four times a day (QID) | INTRAMUSCULAR | Status: DC | PRN
Start: 2014-11-02 — End: 2014-11-04
  Administered 2014-11-03: 4 mg via INTRAVENOUS
  Filled 2014-11-02: qty 2

## 2014-11-02 MED ORDER — ONDANSETRON HCL 4 MG/2ML IJ SOLN
4.0000 mg | Freq: Once | INTRAMUSCULAR | Status: AC
  Administered 2014-11-02: 4 mg via INTRAVENOUS
  Filled 2014-11-02: qty 2

## 2014-11-02 MED ORDER — DEXTROSE-NACL 5-0.9 % IV SOLN
INTRAVENOUS | Status: DC
  Administered 2014-11-02 – 2014-11-03 (×2): via INTRAVENOUS
  Administered 2014-11-04: 75 mL/h via INTRAVENOUS

## 2014-11-02 MED ORDER — CHLORHEXIDINE GLUCONATE CLOTH 2 % EX PADS
6.0000 | MEDICATED_PAD | Freq: Every day | CUTANEOUS | Status: DC
Start: 2014-11-02 — End: 2014-11-05
  Administered 2014-11-02 – 2014-11-04 (×2): 6 via TOPICAL

## 2014-11-02 MED ORDER — LIDOCAINE VISCOUS 2 % MT SOLN: 15.0000 mL | Freq: Once | OROMUCOSAL | Status: DC

## 2014-11-02 MED ORDER — LACTATED RINGERS IV BOLUS (SEPSIS)
2000.0000 mL | Freq: Once | INTRAVENOUS | Status: AC
  Administered 2014-11-02: 2000 mL via INTRAVENOUS

## 2014-11-02 MED ORDER — CHLORHEXIDINE GLUCONATE 0.12 % MT SOLN
15.0000 mL | Freq: Four times a day (QID) | OROMUCOSAL | Status: DC
  Administered 2014-11-02 – 2014-11-03 (×4): 15 mL via OROMUCOSAL
  Filled 2014-11-02 (×10): qty 15

## 2014-11-02 MED ORDER — MUPIROCIN 2 % EX OINT
TOPICAL_OINTMENT | Freq: Two times a day (BID) | CUTANEOUS | Status: DC
  Administered 2014-11-02 – 2014-11-04 (×5): via NASAL
  Filled 2014-11-02: qty 22

## 2014-11-02 MED ORDER — SODIUM CHLORIDE 0.9 % IV BOLUS (SEPSIS)
1000.0000 mL | Freq: Once | INTRAVENOUS | Status: AC
Start: 2014-11-02 — End: 2014-11-02
  Administered 2014-11-02: 1000 mL via INTRAVENOUS

## 2014-11-02 MED ORDER — MORPHINE SULFATE 2 MG/ML IJ SOLN
1.0000 mg | INTRAMUSCULAR | Status: DC | PRN
  Administered 2014-11-02 – 2014-11-03 (×2): 1 mg via INTRAVENOUS
  Filled 2014-11-02 (×3): qty 1

## 2014-11-02 MED ORDER — SODIUM CHLORIDE 0.9 % IV BOLUS (SEPSIS): 1000.0000 mL | Freq: Once | INTRAVENOUS | Status: DC

## 2014-11-02 MED ORDER — LACTATED RINGERS IV BOLUS (SEPSIS): 1000.0000 mL | Freq: Once | INTRAVENOUS | Status: DC

## 2014-11-02 MED ORDER — LORAZEPAM 2 MG/ML IJ SOLN
1.0000 mg | Freq: Once | INTRAMUSCULAR | Status: AC
  Administered 2014-11-02: 1 mg via INTRAVENOUS
  Filled 2014-11-02: qty 1

## 2014-11-02 NOTE — ED Notes (Signed)
Attempted to call report, nurse unavailable.

## 2014-11-02 NOTE — ED Notes (Signed)
Bed: PH43 Expected date:  Expected time:  Means of arrival:  Comments: EMS hospice patient

## 2014-11-02 NOTE — Progress Notes (Addendum)
This pt was seen in [facility] ED by Barbra Sarks of hospice  Pt will be followed and monitored for d/c needs

## 2014-11-02 NOTE — ED Notes (Signed)
MD notified of pt's BP, NS ordered.

## 2014-11-02 NOTE — Progress Notes (Addendum)
Patient seen in ER this morning with his daughter and son at the bedside. Daughter expressed some concerns regarding pt's comfort at home with ongoing vomiting.   Pt. has been vomiting for 2 days with no relief from antiemetics. Pt. Is sleeping and does not respond to his name. He was given a liter of fluids when he came in and a 2nd liter was hung after his BP dropped. Pt. Had 2 episodes with writer present of moderate amount of coffee ground emesis coming  out the side of his mouth and down  His neck. Staff tech set up suction for use prn. Writer talked with Dr. Doy Mince after speaking with family that pt. would benefit from being admitted to manage his sx. Daughter reassured and  advised pt's team would be updated and hospice would talk with her about sx management when pt. is discharged. This is a covered and related admission to his [facility] Dx of carcinoma of the  Supraglottis. Pt's daughter states he is a DNR.  Home medication list and transfer summary will be placed on the chart. Please call with any hospice concerns.  [facility] Liaison [phone] .

## 2014-11-02 NOTE — Progress Notes (Signed)
Initial Nutrition Assessment  DOCUMENTATION CODES:  Underweight  Pt likely meets criteria for severe malnutrition in the context of chronic illness but cannot state with accuracy until physical assessment able to be performed. Pt has lost 10% body weight in 6 months.  INTERVENTION: - Will order Ensure Enlive TID with diet advancement - RD to continue to monitor for needs  NUTRITION DIAGNOSIS:  Inadequate oral intake related to inability to eat as evidenced by NPO status.  GOAL:  Patient will meet greater than or equal to 90% of their needs  MONITOR:  Diet advancement, Weight trends, Labs, I & O's  REASON FOR ASSESSMENT:  Malnutrition Screening Tool  ASSESSMENT: 69 yo male with terminal cancer who is under hospice care who came to the ED due to vomiting and abdominal pain which were uncontrolled at home despite treatments directed by hospice team. Pt admitted for dehydration with vomiting ongoing for 2 days PTA.  Pt seen for MST and underweight status. Pt has been NPO since admission early this AM. Pt with NGT in place for administration of medications. He was sleeping at time of visit and all information provided by the son.  Up until 2 days ago pt had a very good appetite and did not have swallowing difficulty despite hx of throat cancer with last chemotherapy 1 month ago. He liked [place] and would also drink 2-3 Ensure/day. Pt has not had anything to eat of drink x2 days because everything he would try to take PO he would vomit. Son reports that pt has been losing weight but he is unsure of total amount; he states that pt may have lost 5-10 lbs recently. Per weight hx review, pt has lost 12 lbs (10% body weight) in 6 months which is significant for time frame. Son states that pt has not slept in the past 2 days due to pain. Physical assessment not performed at this time as to not bother pt. Pt likely meets criteria for severe malnutrition in the context of chronic illness but  cannot state with accuracy until physical assessment performed.  Unable to meet needs. Family and pt have indicated, per MD notes, that they do not want aggressive intervention. Medications reviewed. No labs available at this time.  Height:  Ht Readings from Last 1 Encounters:  11/04/2014 5\' 11"  (1.803 m)    Weight:  Wt Readings from Last 1 Encounters:  11/07/2014 111 lb 15.9 oz (50.8 kg)    Ideal Body Weight:  78.2 kg (kg)  Wt Readings from Last 10 Encounters:  11/01/2014 111 lb 15.9 oz (50.8 kg)  10/11/14 112 lb 4.8 oz (50.939 kg)  10/04/14 113 lb 6.4 oz (51.438 kg)  09/06/14 114 lb 1.6 oz (51.755 kg)  08/23/14 115 lb (52.164 kg)  08/09/14 115 lb 14.4 oz (52.572 kg)  08/02/14 117 lb 6.4 oz (53.252 kg)  04/17/14 123 lb 4.8 oz (55.929 kg)  01/24/14 128 lb 4.8 oz (58.196 kg)  08/26/13 126 lb 9.6 oz (57.425 kg)    BMI:  Body mass index is 15.63 kg/(m^2).  Estimated Nutritional Needs:  Kcal:  1700-1900  Protein:  70-85 grams  Fluid:  2.5-3 L/day  Skin:  Reviewed, no issues  Diet Order:  Diet NPO time specified  EDUCATION NEEDS:  No education needs identified at this time.   Intake/Output Summary (Last 24 hours) at 10/10/2014 1433 Last data filed at 11/03/2014 1221  Gross per 24 hour  Intake      0 ml  Output  800 ml  Net   -800 ml    Last BM:  PTA   Jarome Matin, RD, LDN Inpatient Clinical Dietitian Pager # [phone] After hours/weekend pager # [phone]

## 2014-11-02 NOTE — ED Provider Notes (Signed)
CSN: [id number]     Arrival date & time 10/12/2014  9798 History   First MD Initiated Contact with Patient 10/24/2014 0507     Chief Complaint  Patient presents with  . Nausea  . Emesis     (Consider location/radiation/quality/duration/timing/severity/associated sxs/prior Treatment) HPI Comments: Pt brought in to the ER with cc of emesis. Pt is a hospice patient. 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 Daughter reports that pt has had constant emesis x 2 days now. Pt has been given meds for nausea and emesis, with no relief. Pt c/o abd pain associated with the emesis. The emesis is dark colored now. Pt is not ambulating anymore, due to gradually worsening Left leg pain. Pt feels weak and has dizziness.   ROS 10 Systems reviewed and are negative for acute change except as noted in the HPI.     Patient is a 69 y.o. male presenting with vomiting. The history is provided by the patient, medical records and a relative.  Emesis Associated symptoms: abdominal pain     Past Medical History  Diagnosis Date  . TB (pulmonary tuberculosis) 1998  . Learning disabilities   . Cough   . Difficulty swallowing   . Pain on swallowing   . Abnormal weight loss documented 07/07/12     20 lbs  . Referred otalgia     Right Ear  . 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 Arthritis   . 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 S/P radiation therapy 09/20/12 - 10/29/12    Tumor bed/stoma/bilateral neck/60 Gy / 30 Fractions  . Status post chemotherapy     adjuvant chemoradiation 09/20/12 through 10/29/2012. Received chemotherapy with weekly Cisplatin.    . Thyroid disease   . Shortness of breath     Hx: of on exertion  . Neuropathy due to chemotherapeutic drug     Hx: of  . Neuropathy 04/19/2013  . Hyponatremia 04/19/2013  . Dysuria  05/10/2013  . Metastatic cancer 08/02/2014  . Weight loss 08/02/2014  . Nausea and vomiting 09/06/2014  . Dysuria 10/03/2014   Past Surgical History  Procedure Laterality Date  . Colonoscopy  2006    neg.   . Direct laryngoscopy  07/09/11    Epiglottic mass  . Direct laryngoscopy  07/08/2012    Procedure: DIRECT LARYNGOSCOPY;  Surgeon: Izora Gala, MD;  Location: [place];  Service: ENT;  Laterality: N/A;  . Esophagoscopy  07/08/2012    Procedure: ESOPHAGOSCOPY;  Surgeon: Izora Gala, MD;  Location: [place];  Service: ENT;  Laterality: N/A;  . Radical neck dissection Bilateral 08/13/2012    Procedure: RADICAL NECK DISSECTION;  Surgeon: Izora Gala, MD;  Location: [place];  Service: ENT;  Laterality: Bilateral;  . Tracheostomy tube placement N/A 08/13/2012    Procedure: TRACHEOSTOMY;  Surgeon: Izora Gala, MD;  Location: [facility];  Service: ENT;  Laterality: N/A;  . Pectoralis flap N/A 08/13/2012    Procedure: RIGHT PECTORALIS MYOCUTANEOUS FLAP to LARYNX;  Surgeon: Crissie Reese, MD;  Location: [place];  Service: Plastics;  Laterality: N/A;  . Multiple extractions with alveoloplasty N/A 08/19/2012    Procedure: Extraction of tooth #'s 1,2,3,4,5,6,10,11,12,13,14,16,17,18,19,21,22,23, 24,25,26,27,28,29,30,31,32 with alveoloplasty and bilateral mandibular buccal exostoses reductions;  Surgeon: Lenn Cal, DDS;  Location:  [place] [facility];  Service: Oral Surgery;  Laterality: N/A;  . Laryngoscopy N/A 08/19/2012    Procedure: LARYNGOSCOPY;  Surgeon: Izora Gala, MD;  Location: [place];  Service: ENT;  Laterality: N/A;  Anesthesia's Laryngoscope  . Portacath placement N/A 03/09/2013    Procedure: INSERTION PORT-A-CATH;  Surgeon: Edward Jolly, MD;  Location: [facility];  Service: General;  Laterality: N/A;   Family History  Problem Relation Age of Onset  . Vascular Disease Mother   . Cancer Father     unk cancer  . Prostate cancer Brother    History  Substance Use Topics  . Smoking status: Former Smoker -- 0.25  packs/day for 40 years    Types: Cigarettes    Quit date: 07/03/2012  . Smokeless tobacco: Never Used     Comment: 1 pack per week  . Alcohol Use: No    Review of Systems  Gastrointestinal: Positive for vomiting and abdominal pain. Negative for blood in stool.  Neurological: Positive for light-headedness. Negative for syncope.  All other systems reviewed and are negative.     Allergies  Review of patient's allergies indicates no known allergies.  Home Medications   Prior to Admission medications   Medication Sig Start Date End Date Taking? Authorizing Provider  lactulose, encephalopathy, (CHRONULAC) 10 GM/15ML SOLN Take 20 g by mouth daily.   Yes Historical Provider, MD  levothyroxine (SYNTHROID, LEVOTHROID) 75 MCG tablet Take 1 tablet (75 mcg total) by mouth daily before breakfast. 04/17/14  Yes Heath Lark, MD  methadone (DOLOPHINE) 5 MG tablet Take 5 mg by mouth 3 (three) times daily. 10/27/14  Yes Historical Provider, MD  metoCLOPramide (REGLAN) 10 MG tablet Take 1 tablet (10 mg total) by mouth 4 (four) times daily -  before meals and at bedtime. 10/04/14  Yes Heath Lark, MD  ondansetron (ZOFRAN) 8 MG tablet Take 1 tablet (8 mg total) by mouth every 8 (eight) hours as needed for nausea. 09/06/14  Yes Heath Lark, MD  oxyCODONE (OXY IR/ROXICODONE) 5 MG immediate release tablet Take 1 tablet (5 mg total) by mouth every 4 (four) hours as needed for severe pain. Patient taking differently: Take 10 mg by mouth every 4 (four) hours as needed for moderate pain or severe pain.  08/02/14  Yes Heath Lark, MD  prochlorperazine (COMPAZINE) 10 MG tablet Take 1 tablet (10 mg total) by mouth every 6 (six) hours as needed for nausea or vomiting. 09/06/14  Yes Ni Gorsuch, MD  GENERLAC 10 GM/15ML SOLN TAKE 30 ML BY MOUTH THREE TIMES DAILY Patient not taking: Reported on 10/09/2014 10/16/14   Heath Lark, MD  polyethylene glycol (MIRALAX) packet Take 17 g by mouth daily. Patient not taking: Reported on  10/14/2014 08/02/14   Heath Lark, MD   BP 105/77 mmHg  Pulse 117  Temp(Src) 97.8 F (36.6 C) (Oral)  Resp 17  Ht 5\' 11"  (1.803 m)  Wt 112 lb (50.803 kg)  BMI 15.63 kg/m2  SpO2 100% Physical Exam  Constitutional:  cachectic  HENT:  Dry mucosa  Eyes: No scleral icterus.  Cardiovascular: Regular rhythm.   Pulmonary/Chest: Effort normal. He has no wheezes.  Nursing note and vitals reviewed.   ED Course  Procedures (including critical care time) Labs Review Labs Reviewed - No data to display  Imaging Review No results found.   EKG Interpretation   Date/Time:  Thursday Nov 02 2014 05:20:32 EDT Ventricular Rate:  134 PR Interval:  93 QRS Duration: 127 QT Interval:  321 QTC Calculation:  479 R Axis:   65 Text Interpretation:  Sinus tachycardia Consider left atrial enlargement  Left bundle branch block tachycardia is new - otherwise unchanged  Confirmed by Ashten Prats, MD, Thelma Comp [phone]) on 10/24/2014 5:32:05 AM      MDM   Final diagnoses:  Dehydration  History of laryngeal cancer  Nausea and vomiting, vomiting of unspecified type    Pt comes in with cc of nausea and vomiting. Pt has stage 4 cancer, and has failed chemo and radiation - and is now comfort care hospice, and DNR, DNI.  Pt is tachycardic, and appears dry. Emesis, several episodes over the last 2 days, with some epigastric abd pain. Last Bm was a day ago. Pt is on opioids for pain.  Goals of care discussed with the patient and daughter, who also has POA. Pt has coffee ground/dark emesis at home, could be bilious, but with his hx of throat CA and radiation, could be upper gi bleed. Family agrees, that they would not want any invasive interventions, even if there is a life threatenining process going on. No indication for any lab workup. We will hydrate, and also give some dextrose. We will get patient some pain control and nausea control.  Pt was newly enrolled to hospice. Daughter has some confusion as to what  services her father is eligible for at home (iv meds, fluids, pain control).  She alleges that the pain is not very well controlled and she wishes iv fluids could have been administered at home. I have spoken with Ms. Janett Billow, RN on call - and someone from Hospice team will see the patient from [facility] hospital close to 9, and help with the disposition. Pt and family ok with the plan.     Varney Biles, MD 10/18/2014 [phone]

## 2014-11-02 NOTE — ED Notes (Signed)
Patient has been vomiting x 2 days. Patient has not been able to sleep or keep anything down.

## 2014-11-02 NOTE — ED Provider Notes (Signed)
Care assumed from Dr. Kathrynn Humble.  69 yo male with terminal cancer who is under hospice care who came to the ED due to vomiting and abdominal pain which were uncontrolled at home despite treatments directed by hospice team.  On exam now, he is ill appearing, decreased interaction with family and staff, but still following basic commands, slightly distended abdomen, vomiting feculent material.  I have discussed with his hospice care team and they feel they are unable to manage his symptoms at home.  They did not feel that admission to the [facility] was an option at this point.  Plan admit for symptom control.    Clinical Impression: 1. Dehydration   2. History of laryngeal cancer   3. Nausea and vomiting, vomiting of unspecified type   4. Intestinal obstruction, unspecified intestinal obstruction type       Serita Grit, MD 11/04/2014 1205

## 2014-11-02 NOTE — H&P (Signed)
History and Physical  PRINCETON NABOR QVZ:[id number] DOB: Jun 22, 1945 DOA: 10/31/2014  Referring physician: EDP PCP: [name], MD   Chief Complaint: n/v/ab pain  HPI: Cody [name] is a 69 y.o. male   With end stage laryngeal cancer was brought to the ED due to above complaints. Patient was under home hospice care, however, his symptom was not able to be managed at home, family wish patient to be admitted to the hospital for symptom control.   ED course, kub showed bowel obstruction with transition point appears to be within the colon. Family does not wish aggressive measures, but do want patient to be admitted to try ivf, ng suction and prn pain meds and antiemetic.   Review of Systems:  Detail per HPI, Review of systems are otherwise negative  Past Medical History  Diagnosis Date  . TB (pulmonary tuberculosis) 1998  . Learning disabilities   . Cough   . Difficulty swallowing   . Pain on swallowing   . Abnormal weight loss documented 07/07/12     20 lbs  . Referred otalgia     Right Ear  . 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 Arthritis   . 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 S/P radiation therapy 09/20/12 - 10/29/12    Tumor bed/stoma/bilateral neck/60 Gy / 30 Fractions  . Status post chemotherapy     adjuvant chemoradiation 09/20/12 through 10/29/2012. Received chemotherapy with weekly Cisplatin.    . Thyroid disease   . Shortness of breath     Hx: of on exertion  . Neuropathy due to chemotherapeutic drug     Hx: of  . Neuropathy 04/19/2013  . Hyponatremia 04/19/2013  . Dysuria 05/10/2013  . Metastatic cancer 08/02/2014  . Weight loss 08/02/2014  . Nausea and vomiting 09/06/2014  . Dysuria 10/03/2014   Past Surgical History  Procedure Laterality Date  . Colonoscopy  2006    neg.   . Direct laryngoscopy  07/09/11    Epiglottic mass  . Direct laryngoscopy  07/08/2012    Procedure: DIRECT  LARYNGOSCOPY;  Surgeon: Izora Gala, MD;  Location: [place];  Service: ENT;  Laterality: N/A;  . Esophagoscopy  07/08/2012    Procedure: ESOPHAGOSCOPY;  Surgeon: Izora Gala, MD;  Location: [place];  Service: ENT;  Laterality: N/A;  . Radical neck dissection Bilateral 08/13/2012    Procedure: RADICAL NECK DISSECTION;  Surgeon: Izora Gala, MD;  Location: [place];  Service: ENT;  Laterality: Bilateral;  . Tracheostomy tube placement N/A 08/13/2012    Procedure: TRACHEOSTOMY;  Surgeon: Izora Gala, MD;  Location: [facility];  Service: ENT;  Laterality: N/A;  . Pectoralis flap N/A 08/13/2012    Procedure: RIGHT PECTORALIS MYOCUTANEOUS FLAP to LARYNX;  Surgeon: Crissie Reese, MD;  Location: [place];  Service: Plastics;  Laterality: N/A;  . Multiple extractions with alveoloplasty N/A 08/19/2012    Procedure: Extraction of tooth #'s 1,2,3,4,5,6,10,11,12,13,14,16,17,18,19,21,22,23, 24,25,26,27,28,29,30,31,32 with alveoloplasty and bilateral mandibular buccal exostoses reductions;  Surgeon: Lenn Cal, DDS;  Location: [place];  Service: Oral Surgery;  Laterality: N/A;  . Laryngoscopy N/A 08/19/2012    Procedure: LARYNGOSCOPY;  Surgeon: Izora Gala, MD;  Location: [place];  Service: ENT;  Laterality: N/A;  Anesthesia's Laryngoscope  . Portacath placement N/A 03/09/2013    Procedure: INSERTION PORT-A-CATH;  Surgeon: Edward Jolly, MD;  Location: [place];  Service: General;  Laterality: N/A;  Social History:  reports that he quit smoking about 2 years ago. His smoking use included Cigarettes. He has a 10 pack-year smoking history. He has never used smokeless tobacco. He reports that he does not drink alcohol or use illicit drugs. Patient lives at home with family & on home hospice  No Known Allergies  Family History  Problem Relation Age of Onset  . Vascular Disease Mother   . Cancer Father     unk cancer  . Prostate cancer Brother       Prior to Admission medications   Medication Sig Start Date End Date Taking?  Authorizing Provider  lactulose, encephalopathy, (CHRONULAC) 10 GM/15ML SOLN Take 20 g by mouth daily.   Yes Historical Provider, MD  levothyroxine (SYNTHROID, LEVOTHROID) 75 MCG tablet Take 1 tablet (75 mcg total) by mouth daily before breakfast. 04/17/14  Yes Heath Lark, MD  methadone (DOLOPHINE) 5 MG tablet Take 5 mg by mouth 3 (three) times daily. 10/27/14  Yes Historical Provider, MD  metoCLOPramide (REGLAN) 10 MG tablet Take 1 tablet (10 mg total) by mouth 4 (four) times daily -  before meals and at bedtime. 10/04/14  Yes Heath Lark, MD  ondansetron (ZOFRAN) 8 MG tablet Take 1 tablet (8 mg total) by mouth every 8 (eight) hours as needed for nausea. 09/06/14  Yes Heath Lark, MD  oxyCODONE (OXY IR/ROXICODONE) 5 MG immediate release tablet Take 1 tablet (5 mg total) by mouth every 4 (four) hours as needed for severe pain. Patient taking differently: Take 10 mg by mouth every 4 (four) hours as needed for moderate pain or severe pain.  08/02/14  Yes Heath Lark, MD  prochlorperazine (COMPAZINE) 10 MG tablet Take 1 tablet (10 mg total) by mouth every 6 (six) hours as needed for nausea or vomiting. 09/06/14  Yes Ni Gorsuch, MD  GENERLAC 10 GM/15ML SOLN TAKE 30 ML BY MOUTH THREE TIMES DAILY Patient not taking: Reported on 10/31/2014 10/16/14   Heath Lark, MD  polyethylene glycol (MIRALAX) packet Take 17 g by mouth daily. Patient not taking: Reported on 11/06/2014 08/02/14   Heath Lark, MD    Physical Exam: BP 115/77 mmHg  Pulse 121  Temp(Src) 98.1 F (36.7 C) (Oral)  Resp 17  Ht 5\' 11"  (1.803 m)  Wt 50.8 kg (111 lb 15.9 oz)  BMI 15.63 kg/m2  SpO2 100%  General:  Frail, but does not seem in distress. Eyes: PERRL ENT: unremarkable Neck: supple, no JVD Cardiovascular: RRR Respiratory: diminished, but no obvious wheezes, rales or rhonchi Abdomen: distended, tender, decreased bowel sounds. Does not seem to have  Rebound tenderness. Skin: no rash Musculoskeletal:  No edema Psychiatric: flat affect,  not very interactive,  Neurologic: does attempt to follow commend, not very interactive.          Labs on Admission:  Basic Metabolic Panel: No results for input(s): NA, K, CL, CO2, GLUCOSE, BUN, CREATININE, CALCIUM, MG, PHOS in the last 168 hours. Liver Function Tests: No results for input(s): AST, ALT, ALKPHOS, BILITOT, PROT, ALBUMIN in the last 168 hours. No results for input(s): LIPASE, AMYLASE in the last 168 hours. No results for input(s): AMMONIA in the last 168 hours. CBC: No results for input(s): WBC, NEUTROABS, HGB, HCT, MCV, PLT in the last 168 hours. Cardiac Enzymes: No results for input(s): CKTOTAL, CKMB, CKMBINDEX, TROPONINI in the last 168 hours.  BNP (last 3 results) No results for input(s): BNP in the last 8760 hours.  ProBNP (last 3 results) No results for input(s):  PROBNP in the last 8760 hours.  CBG: No results for input(s): GLUCAP in the last 168 hours.  Radiological Exams on Admission: Dg Abd Acute W/chest  11/01/2014   CLINICAL DATA:  Nausea and emesis. Supraglottic laryngeal tumor with widespread metastases.  EXAM: DG ABDOMEN ACUTE W/ 1V CHEST  COMPARISON:  CT of the chest, abdomen, and pelvis 10/10/2014  FINDINGS: Heart size is normal. A left subclavian Port-A-Cath is stable in position. Emphysematous changes are noted. The lungs are otherwise clear.  Supine and upright views the abdomen demonstrate widespread extension of both large and small bowel loops. There is no free air. Minimal gas is present in the distal transverse or descending colon. Degenerative changes of the axial skeleton are again noted. Sclerotic changes in the pelvis are compatible with known metastases.  IMPRESSION: 1. Developing bowel obstruction. The transition point appears to be within the colon. 2. No free air. 3. Emphysema. 4. Osseous metastases.   Electronically Signed   By: San Morelle M.D.   On: 10/31/2014 07:20    EKG: not done  Assessment/Plan Present on Admission:  .  Bowel obstruction   Bowel obstruction in the setting of end stage laryngeal cancer, patient is admitted mainly for symptom control. I have contacted palliative care Dr. Deitra Mayo, they will see patient in consult. Ng is placed in the ED, output does look bloody, i have discussed with family, they did not want aggressive intervention, do not want excessive blood draw. i have discussed with them this might be the terminal event, if patient continue to deteriorate, likely will need morphin drip for comfort, family states they will think about it. For now continue ng suction, ivf/prn analgesics/ prn antiemetics.  Consultants: palliative care  Code Status: DNR/DNI  Family Communication:  Patient and multiple family members  Disposition Plan: admit to med surg  Time spent: 23mins, more than 50% of time spent in talking to family and coordination of care.  Naw Lasala MD, PhD [facility] Pager [phone] If 7PM-7AM, please contact night-coverage at [email or web address], password [facility]

## 2014-11-03 ENCOUNTER — Inpatient Hospital Stay (HOSPITAL_COMMUNITY)

## 2014-11-03 DIAGNOSIS — K5649 Other impaction of intestine: Secondary | ICD-10-CM

## 2014-11-03 MED ORDER — HYDROMORPHONE HCL 1 MG/ML IJ SOLN
0.5000 mg | INTRAMUSCULAR | Status: DC | PRN
  Administered 2014-11-03: 0.5 mg via INTRAVENOUS
  Filled 2014-11-03: qty 1

## 2014-11-03 MED ORDER — SODIUM CHLORIDE 0.9 % IJ SOLN: 10.0000 mL | INTRAMUSCULAR | Status: DC | PRN

## 2014-11-03 MED ORDER — LORAZEPAM 2 MG/ML IJ SOLN
0.5000 mg | Freq: Four times a day (QID) | INTRAMUSCULAR | Status: DC | PRN
Start: 2014-11-03 — End: 2014-11-04

## 2014-11-03 MED ORDER — MORPHINE SULFATE 4 MG/ML IJ SOLN
4.0000 mg | INTRAMUSCULAR | Status: DC | PRN
  Administered 2014-11-03: 4 mg via INTRAVENOUS
  Filled 2014-11-03: qty 1

## 2014-11-03 NOTE — Progress Notes (Signed)
Patient suctioned for a small to moderate thick white secretions. Vomited some bile secretions which he spit out in a cup.Placed on room air trach collar for humidification.

## 2014-11-03 NOTE — Consult Note (Signed)
Palliative care consult requested for assistance with symptom management and transition to discharge.  Family meeting took place at bedside with primary caregiver Solmon Ice (Mr. Murgia daughter)  She cares for Mr. Mayabb at her home with the assistance of multiple additional family members.  He currently remains on service with [facility].  This is a related hospice diagnosis admission for acute symptom management, therefore updates and discharge recommendations will be communicated to  [facility] hospital liaison, Barbra Sarks.  Felicia began by discussing the events that took place at home prior to the decision to transport via EMS to [facility] ED.  Mr. Sweetman is at end stage laryngeal cancer with metastasis, treatment includes radical neck dissection with permanent tracheostomy placement in 2014. He had been treated at the [facility] by Dr. Alvy Bimler and was referred for hospice care early in May.  On Wednesday of this week Felicia reports that he began to suffer from severe nausea and vomiting with the c/o abdominal pain.  His PO intake had been decreasing over the past few weeks, limited to soft foods and liquids only.  He had regular bowel movements, on a daily basis with the use of Miralax.  Hospice clinicians were visiting regularly and did respond to the home after the report of these new symptoms.  Unfortunately his symptoms were not able to be managed at home and the decision was made to transport to [facility] ED for acute symptom control.  Radiology KUB results report a developing colon obstruction and an N/G tube was placed with the return of more than 800cc of dark, coffee ground appearing waste.  Over the course of the evening through the morning he had to have a second tube inserted due to dislodgement and subsequently that was removed as well.    Assessment finds the patient in bed with eyes closed.  He does arouse to gently touch and verbal prompts from his daughter and has appropriate response to her  questions.  He does not appear to be in pain and denies significant pain symptoms when asked.  He has been "spitting" out a small amount of dark emesis this morning, according to [facility].  I observed about 200cc's in a styrofoam cup that had been collected by her.  He has not been able to take any PO liquids but has had a decent UOP via condom cath.  He reported to [facility] earlier that he had to have a BM, but was not willing to use the bedpan and then stated that he did not need to go "right now".  He received one dose of IV Zofran 4mg  this morning at 0830.  He did experience marked improvement of the nausea and vomiting with the NG tube suction and at this point it is not clear if the developing obstruction with resolve or reduce enough for him to return to his pre-hospitalized condition.  I discussed this at length with Felicia including the advancement of the underlying disease process complicating the trajectory.   The goals are clear.  Mr. Piper is DNR and the primary goal of care is for comfort and dignity through end of life with the primary attention to relief of suffering. The family, Solmon Ice as primary, have been and appear to be very capable of caregiving, communication and learning all aspects of the care needed to keep Mr. Kirkwood at home, this is his desire as well as the desire of his family, that he be cared for at home with the assistance of hospice until a natural  death occurs.   I believe this is attainable with the right anticipatory plan in place.  Recommendations include:  Replace N/G tube, Dr. Erlinda Hong attending agrees with fluro guided placement, order will be placed.  Adjustment of medications at discharge to scheduled S/L Ativan, 0.5mg  q 6 hrs, d/c methadone and reducing pain management to Roxanol 10mg  S/L q4hrs with titration per hospice recommendations as symptoms warrant.   Will follow after N/G placement to determine course of transition and return home.  Kizzie Fantasia, RN,  MSN, [facility] Palliative Care

## 2014-11-03 NOTE — Care Management Note (Signed)
Case Management Note  Patient Details  Name: Cody [name] MRN: [id number] Date of Birth: 1946/05/24  Subjective/Objective:    69 yo male admitted with SBO                Action/Plan: Patient is from home with daughter and family support. He is currently under the services of [facility] and wishes to return home with the care of [facility] once medically stable for dc. [facility] and [facility] following patient while in hosptial.  Expected Discharge Date:   [place] Po)               Expected Discharge Plan:  Home w Hospice Care  In-House Referral:     Discharge planning Services  CM Consult  Post Acute Care Choice:  Resumption of Svcs/PTA Provider Choice offered to:     DME Arranged:    DME Agency:     HH Arranged:    [place] Agency:  [facility]  Status of Service:  In process, will continue to follow  Medicare Important Message Given:    Date Medicare IM Given:    Medicare IM give by:    Date Additional Medicare IM Given:    Additional Medicare Important Message give by:     If discussed at [place] of Stay Meetings, dates discussed:    Additional Comments:  Scot Dock, RN 11/03/2014, 3:11 PM

## 2014-11-03 NOTE — Progress Notes (Signed)
Patient ng tube malfunctioned last night, request floro guided placement, feeling better after ng suction back to function. i talked to palliative care team, patient will go home with home hospice and NG suction device, Daughter in room, agree with the plan.

## 2014-11-03 NOTE — Progress Notes (Addendum)
This is a covered and related admission to his [facility] Dx of carcinoma of the Supraglottis. Patient is a DNR code status. Patient sleeping when Probation officer entered room . Pt's dtr. Felicia is at the bedside. She reports he has been much more comfortable. Pt. has a trach collar on for humidification. Pt. denied pain when asked. He has requested some water to drink and has asked to get up to the BR. He currently has a condom catheter on. Writer listened to daughter verbalize how she and her father want to go home once his sx are treated. Pt. advised a  member of the Palliative Care team would be in to discuss goals of care and management of sx once pt. Is discharged home.  Writer talked with CM and staff nurse to update on the conversation with pt's dtr. Staff nurse waiting to hear  if an NG tube will be placed again. Dtr. and staff advised hopsice will continue to visit daily and share any changes with the hospice team.  Home medication list and transfer summary placed on the chart. Please call with any hospice concerns.   [facility] Liaison [phone]   Addendum: 200 pm Gomco suction ordered to be delivered to the home tonight for use as directed when discharged.  425pm Please fax discharge instructions to [facility] at [phone] when comoleted.  Thank you.

## 2014-11-04 DIAGNOSIS — R112 Nausea with vomiting, unspecified: Secondary | ICD-10-CM

## 2014-11-04 DIAGNOSIS — K56609 Unspecified intestinal obstruction, unspecified as to partial versus complete obstruction: Secondary | ICD-10-CM | POA: Insufficient documentation

## 2014-11-04 DIAGNOSIS — K5660 Unspecified intestinal obstruction: Principal | ICD-10-CM

## 2014-11-04 DIAGNOSIS — Z8521 Personal history of malignant neoplasm of larynx: Secondary | ICD-10-CM | POA: Insufficient documentation

## 2014-11-04 DIAGNOSIS — E860 Dehydration: Secondary | ICD-10-CM | POA: Insufficient documentation

## 2014-11-04 MED ORDER — PROCHLORPERAZINE 25 MG RE SUPP
25.0000 mg | Freq: Four times a day (QID) | RECTAL | Status: DC
  Filled 2014-11-04 (×3): qty 1

## 2014-11-04 MED ORDER — LORAZEPAM 0.5 MG PO TABS
0.5000 mg | ORAL_TABLET | Freq: Four times a day (QID) | ORAL | Status: DC
  Administered 2014-11-04 (×2): 0.5 mg via SUBLINGUAL
  Filled 2014-11-04 (×2): qty 1

## 2014-11-04 MED ORDER — MORPHINE SULFATE (CONCENTRATE) 10 MG/0.5ML PO SOLN
10.0000 mg | ORAL | Status: DC | PRN
  Administered 2014-11-04 – 2014-11-05 (×3): 10 mg via SUBLINGUAL
  Filled 2014-11-04 (×3): qty 0.5

## 2014-11-04 MED ORDER — PROCHLORPERAZINE 25 MG RE SUPP
25.0000 mg | Freq: Four times a day (QID) | RECTAL | Status: DC | PRN
  Filled 2014-11-04: qty 1

## 2014-11-04 NOTE — Consult Note (Signed)
Patient Information    Patient Name Sex DOB SSN   Osmond, Steckman Male [birth year] [id number]    Consult Note by Leroy Kennedy, RN at 11/03/2014 1:47 PM    Author: Leroy Kennedy, RN Service: Palliative Care Author Type: Registered Nurse   Filed: 11/03/2014 3:30 PM Note Time: 11/03/2014 1:47 PM Status: Incomplete   Editor: Leroy Kennedy, RN (Registered Nurse)     Expand All Collapse All   Palliative care consult requested for assistance with symptom management and transition to discharge.  Family meeting took place at bedside with primary caregiver Solmon Ice (Mr. Lipford daughter) She cares for Mr. Sawatzky at her home with the assistance of multiple additional family members. He currently remains on service with [facility]. This is a related hospice diagnosis admission for acute symptom management, therefore updates and discharge recommendations will be communicated to [facility] hospital liaison, Barbra Sarks.  Felicia began by discussing the events that took place at home prior to the decision to transport via EMS to [facility] ED. Mr. Solivan is at end stage laryngeal cancer with metastasis, treatment includes radical neck dissection with permanent tracheostomy placement in 2014. He had been treated at the [facility] by Dr. Alvy Bimler and was referred for hospice care early in May.  On Wednesday of this week Felicia reports that he began to suffer from severe nausea and vomiting with the c/o abdominal pain. His PO intake had been decreasing over the past few weeks, limited to soft foods and liquids only. He had regular bowel movements, on a daily basis with the use of Miralax. Hospice clinicians were visiting regularly and did respond to the home after the report of these new symptoms. Unfortunately his symptoms were not able to be managed at home and the decision was made to transport to [facility] ED for acute symptom control. Radiology KUB results report a developing colon obstruction and an N/G tube was  placed with the return of more than 800cc of dark, coffee ground appearing waste. Over the course of the evening through the morning he had to have a second tube inserted due to dislodgement and subsequently that was removed as well.   Assessment finds the patient in bed with eyes closed. He does arouse to gently touch and verbal prompts from his daughter and has appropriate response to her questions. He does not appear to be in pain and denies significant pain symptoms when asked. He has been "spitting" out a small amount of dark emesis this morning, according to [facility]. I observed about 200cc's in a styrofoam cup that had been collected by her. He has not been able to take any PO liquids but has had a decent UOP via condom cath. He reported to [facility] earlier that he had to have a BM, but was not willing to use the bedpan and then stated that he did not need to go "right now". He received one dose of IV Zofran 4mg  this morning at 0830.  He did experience marked improvement of the nausea and vomiting with the NG tube suction and at this point it is not clear if the developing obstruction with resolve or reduce enough for him to return to his pre-hospitalized condition. I discussed this at length with Felicia including the advancement of the underlying disease process complicating the trajectory.   The goals are clear. Mr. Gracia is DNR and the primary goal of care is for comfort and dignity through end of life with the primary attention to relief of  suffering. The family, Solmon Ice as primary, have been and appear to be very capable of caregiving, communication and learning all aspects of the care needed to keep Mr. Brine at home, this is his desire as well as the desire of his family, that he be cared for at home with the assistance of hospice until a natural death occurs.  I believe this is attainable with the right anticipatory plan in place.  Recommendations include: Replace N/G tube,  Dr. Erlinda Hong attending agrees with fluro guided placement, order will be placed. Adjustment of medications at discharge to scheduled S/L Ativan, 0.5mg  q 6 hrs, d/c methadone and reducing pain management to Roxanol 10mg  S/L q4hrs with titration per hospice recommendations as symptoms warrant.   Will follow after N/G placement to determine course of transition and return home.  Kizzie Fantasia, RN, MSN, [facility] Palliative Care

## 2014-11-04 NOTE — Discharge Planning (Addendum)
Palliative Care discharge recommendations as communicated with [facility] liaison.  N/G to low LIS 4 times daily for 1hr.  Clamp in place when not connected.  Do NOT use for medication administration.  May place to suction for any increase in nausea/vomiting and remain on LIS if needed and assessed by [facility] RN  Discontinue Methadone Discontinue Miralax  Symptom management medications:  Ativan 0.5mg  S/L every 6hrs scheduled  Roxanol 10mg  S/L every 3 hours as needed for pain dispense as Roxanol 20mg /ml.  Titration per hospice as needed  Compazine 25mg  suppository PR every 6hrs PRN for nausea  Please call [facility] weekend on call at discharge to meet patient at home after discharge today, contact is daughter Solmon Ice.  Please call with any questions,  Kizzie Fantasia, MSN, RN, [place]

## 2014-11-04 NOTE — Discharge Instructions (Signed)
Hospice °Hospice is a service that is designed to provide people who are terminally ill and their families with medical, spiritual, and psychological support. Its aim is to improve your quality of life by keeping you as alert and comfortable as possible. Hospice is performed by a team of health care professionals and volunteers who: °· Help keep you comfortable. Hospice can be provided in your home or in a homelike setting. The hospice staff works with your family and friends to help meet your needs. You will enjoy the support of loved ones by receiving much of your basic care from family and friends. °· Provide pain relief and manage your symptoms. The staff supply all necessary medicines and equipment. °· Provide companionship when you are alone. °· Allow you and your family to rest. They may do light housekeeping, prepare meals, and run errands. °· Provide counseling. They will make sure your emotional, spiritual, and social needs and those of your family are being met. °· Provide spiritual care. Spiritual care is individualized to meet your needs and your family's needs. It may involve helping you look at what death means to you, say goodbye, or perform a specific religious ceremony or ritual. °Hospice teams often include: °· A nurse. °· A doctor. °· Social workers. °· Religious leaders (such as a chaplain). °· Trained volunteers. °WHEN SHOULD HOSPICE CARE BEGIN? °Most people who use hospice are believed to have fewer than 6 months to live. Your family and health care providers can help you decide when hospice services should begin. If your condition improves, you may discontinue the program. °WHAT SHOULD I CONSIDER BEFORE SELECTING A PROGRAM? °Most hospice programs are run by nonprofit, independent organizations. Some are affiliated with hospitals, nursing homes, or home health care agencies. Hospice programs can take place in the home or at a hospice center, hospital, or skilled nursing facility. When choosing  a hospice program, ask the following questions: °· What services are available to me? °· What services are offered to my loved ones? °· How involved are my loved ones? °· How involved is my health care provider? °· Who makes up the hospice care team? How are they trained or screened? °· How will my pain and symptoms be managed? °· If my circumstances change, can the services be provided in a different setting, such as my home or in the hospital? °· Is the program reviewed and licensed by the state or certified in some other way? °WHERE CAN I LEARN MORE ABOUT HOSPICE? °You can learn about existing hospice programs in your area from your health care providers. You can also read more about hospice online. The websites of the following organizations contain helpful information: °· The National Hospice and Palliative Care Organization (NHPCO). °· The Hospice Association of America (HAA). °· The Hospice Education Institute. °· The American Cancer Society (ACS). °· Hospice Net. °Document Released: 09/12/2003 Document Revised: 05/31/2013 Document Reviewed: 04/05/2013 °ExitCare® Patient Information ©2015 [employer]. This information is not intended to replace advice given to you by your health care provider. Make sure you discuss any questions you have with your health care provider. ° °

## 2014-11-04 NOTE — Discharge Summary (Signed)
Physician Discharge Summary  Cody [name] ESP:[id number] DOB: 1945-10-15 DOA: 10/19/2014  PCP: [name], MD  Admit date: 10/22/2014 Discharge date: 11/04/2014  Recommendations for Outpatient Follow-up:  1. Pt will be discharged with home hospice, NG tube to suction.   Discharge Diagnoses:  Active Problems:   Bowel obstruction    Discharge Condition: stable   Diet recommendation: as tolerated   History of present illness:  69 y.o. male with end stage laryngeal cancer, under hospice care, symptoms are managed at home by his family. Pt presented to [facility] ED with nausea, vomiting and abdominal discomfort over past 24 hours prior to this admission.  On the admission, pt was found to have SBO.  Family requested no further intervention, only fluids for hydration, antiemetics.  Hospital Course:  Active Problems:   Bowel obstruction - In the setting of advanced end stage esophageal carcinoma - No aggressive interventions per family request - NG tube to suction, managed by family at home - D/C home wiht hospice care  - Continue current meds as per prior to the admission.    Code status: DNR/DNI    Signed:  [name], MD  [facility] 11/04/2014, 9:25 AM  Pager #: [phone]  Time spent in minutes: less than 30 minutes   Discharge Exam: Filed Vitals:   11/04/14 0612  BP: 114/62  Pulse: 122  Temp: 97.7 F (36.5 C)  Resp: 18   Filed Vitals:   11/03/14 2120 11/04/14 0039 11/04/14 0444 11/04/14 0612  BP:  84/55  114/62  Pulse: 128 124 124 122  Temp:    97.7 F (36.5 C)  TempSrc:    Axillary  Resp: 18 18 16 18   Height:      Weight:      SpO2: 98%  100% 99%    General: Pt is alert, not in acute distress, has NG tube in place Cardiovascular: tachycardic, S1/S2 (+) Respiratory: diminished breath sounds, no wheezing  Abdominal: non tender, not distended, (+) BS Extremities: no edema, no cyanosis, pulses palpable bilaterally DP and PT Neuro:  Grossly nonfocal  Discharge Instructions  Discharge Instructions    Call MD for:  difficulty breathing, headache or visual disturbances    Complete by:  As directed      Call MD for:  persistant nausea and vomiting    Complete by:  As directed      Call MD for:  severe uncontrolled pain    Complete by:  As directed      Diet - low sodium heart healthy    Complete by:  As directed      Increase activity slowly    Complete by:  As directed             Medication List    TAKE these medications        lactulose (encephalopathy) 10 GM/15ML Soln  Commonly known as:  CHRONULAC  Take 20 g by mouth daily.     levothyroxine 75 MCG tablet  Commonly known as:  SYNTHROID, LEVOTHROID  Take 1 tablet (75 mcg total) by mouth daily before breakfast.     methadone 5 MG tablet  Commonly known as:  DOLOPHINE  Take 5 mg by mouth 3 (three) times daily.     metoCLOPramide 10 MG tablet  Commonly known as:  REGLAN  Take 1 tablet (10 mg total) by mouth 4 (four) times daily -  before meals and at bedtime.     ondansetron 8 MG tablet  Commonly  known as:  ZOFRAN  Take 1 tablet (8 mg total) by mouth every 8 (eight) hours as needed for nausea.     oxyCODONE 5 MG immediate release tablet  Commonly known as:  Oxy IR/ROXICODONE  Take 1 tablet (5 mg total) by mouth every 4 (four) hours as needed for severe pain.     polyethylene glycol packet  Commonly known as:  MIRALAX  Take 17 g by mouth daily.     prochlorperazine 10 MG tablet  Commonly known as:  COMPAZINE  Take 1 tablet (10 mg total) by mouth every 6 (six) hours as needed for nausea or vomiting.           Follow-up Information    Schedule an appointment as soon as possible for a visit with [name], MD.   Specialty:  Internal Medicine   Why:  As needed   Contact information:   [place] [address] [phone]        The results of significant diagnostics from this hospitalization  (including imaging, microbiology, ancillary and laboratory) are listed below for reference.    Significant Diagnostic Studies: Ct Chest W Contrast  10/10/2014   CLINICAL DATA:  Supraglottic carcinoma with osseous metastatic disease. Radiation therapy completed 2 years ago. Chemotherapy ongoing. Subsequent encounter.  EXAM: CT CHEST, ABDOMEN, AND PELVIS WITH CONTRAST  TECHNIQUE: Multidetector CT imaging of the chest, abdomen and pelvis was performed following the standard protocol during bolus administration of intravenous contrast.  CONTRAST:  156mL OMNIPAQUE IOHEXOL 300 MG/ML  SOLN  COMPARISON:  CTs 07/27/2014 and 08/14/2014. Whole body bone scan 08/17/2014. PET-CT 07/15/2013.  FINDINGS: CT CHEST FINDINGS  Mediastinum/Nodes: Postsurgical changes are again noted in the lower neck status post tracheostomy. The peripherally enhancing node laterally in the right supraclavicular fossa is unchanged, measuring 12 mm on image 9. There are no enlarged mediastinal, hilar or axillary lymph nodes. The heart size is normal. There is no pericardial effusion.There is diffuse atherosclerosis of the aorta, great vessels and coronary arteries.  Lungs/Pleura: There is no pleural effusion.Chronic radiation changes at both lung apices with subpleural scarring, air bronchograms and volume loss are similar to the prior study. There are chronic postinflammatory changes more inferiorly in the right upper lobe with associated bronchiectasis. No suspicious pulmonary nodules identified.  Musculoskeletal/Chest wall: Diffuse muscular atrophy throughout the right chest wall again noted. There is widespread osseous metastatic disease with multiple blastic lesions throughout the spine, ribs and sternum. No pathologic fracture identified. The soft tissue nodularity around the sternum noted previously has nearly completely resolved.  CT ABDOMEN AND PELVIS FINDINGS  Hepatobiliary: The liver is normal in density without focal abnormality. No  evidence of gallstones, gallbladder wall thickening or biliary dilatation.  Pancreas: Unremarkable. No pancreatic ductal dilatation or surrounding inflammatory changes.  Spleen: Normal in size without focal abnormality.  Adrenals/Urinary Tract: Both adrenal glands appear normal.Multiple renal cysts are again noted bilaterally. 1.6 cm hyperdense lesion in the upper pole of the right kidney on image 69 corresponds with a probable hemorrhagic cyst on prior PET-CT. No hydronephrosis or bladder abnormality seen.  Stomach/Bowel: No evidence of bowel wall thickening, distention or surrounding inflammatory change.No ascites or extraluminal fluid collection identified.  Vascular/Lymphatic: Again demonstrated is extensive partially necrotic retroperitoneal lymphadenopathy. Most of these are unchanged to slightly worse. Representative nodes include a left periaortic nodal mass measuring 3.6 x 2.3 cm on image 78 (previously 3.1 x 2.0 cm), and a large right pelvic side wall nodal mass  measuring 5.5 x 3.3 cm on image 105 (previously 5.6 x 3.5 cm). The large necrotic mass involving the left sciatic notch measures 6.3 x 5.1 cm on image 106 (previously 5.8 x 4.4 cm). This mass invades the sacrum with associated sacral epidural tumor. Right inguinal adenopathy appears unchanged. Diffuse aortoiliac atherosclerosis again noted. No large vessel occlusion identified.  Reproductive: Unremarkable.  Other: No evidence of abdominal wall mass or hernia.  Musculoskeletal: Extensive osseous metastatic disease again noted. There may be a small amount of epidural tumor on the left at T10-11. No pathologic fracture identified in the spine. As above, the mass within the left sciatic notch invades and partially destroys the sacrum. There is partial destruction of the right iliac bone and posterior acetabulum. The soft tissue component associated with the posterior right acetabular lesion has slightly improved.  IMPRESSION: 1. Slow progression of  bulky partial necrotic retroperitoneal and pelvic lymphadenopathy consistent with worsening metastatic disease. 2. Diffuse osseous metastatic disease. 3. No evidence of solid organ parenchymal metastases. No definite pulmonary metastases. 4. Stable right lateral supraclavicular lymph node.   Electronically Signed   By: Richardean Sale M.D.   On: 10/10/2014 16:19   Ct Abdomen Pelvis W Contrast  10/10/2014   CLINICAL DATA:  Supraglottic carcinoma with osseous metastatic disease. Radiation therapy completed 2 years ago. Chemotherapy ongoing. Subsequent encounter.  EXAM: CT CHEST, ABDOMEN, AND PELVIS WITH CONTRAST  TECHNIQUE: Multidetector CT imaging of the chest, abdomen and pelvis was performed following the standard protocol during bolus administration of intravenous contrast.  CONTRAST:  15mL OMNIPAQUE IOHEXOL 300 MG/ML  SOLN  COMPARISON:  CTs 07/27/2014 and 08/14/2014. Whole body bone scan 08/17/2014. PET-CT 07/15/2013.  FINDINGS: CT CHEST FINDINGS  Mediastinum/Nodes: Postsurgical changes are again noted in the lower neck status post tracheostomy. The peripherally enhancing node laterally in the right supraclavicular fossa is unchanged, measuring 12 mm on image 9. There are no enlarged mediastinal, hilar or axillary lymph nodes. The heart size is normal. There is no pericardial effusion.There is diffuse atherosclerosis of the aorta, great vessels and coronary arteries.  Lungs/Pleura: There is no pleural effusion.Chronic radiation changes at both lung apices with subpleural scarring, air bronchograms and volume loss are similar to the prior study. There are chronic postinflammatory changes more inferiorly in the right upper lobe with associated bronchiectasis. No suspicious pulmonary nodules identified.  Musculoskeletal/Chest wall: Diffuse muscular atrophy throughout the right chest wall again noted. There is widespread osseous metastatic disease with multiple blastic lesions throughout the spine, ribs and  sternum. No pathologic fracture identified. The soft tissue nodularity around the sternum noted previously has nearly completely resolved.  CT ABDOMEN AND PELVIS FINDINGS  Hepatobiliary: The liver is normal in density without focal abnormality. No evidence of gallstones, gallbladder wall thickening or biliary dilatation.  Pancreas: Unremarkable. No pancreatic ductal dilatation or surrounding inflammatory changes.  Spleen: Normal in size without focal abnormality.  Adrenals/Urinary Tract: Both adrenal glands appear normal.Multiple renal cysts are again noted bilaterally. 1.6 cm hyperdense lesion in the upper pole of the right kidney on image 69 corresponds with a probable hemorrhagic cyst on prior PET-CT. No hydronephrosis or bladder abnormality seen.  Stomach/Bowel: No evidence of bowel wall thickening, distention or surrounding inflammatory change.No ascites or extraluminal fluid collection identified.  Vascular/Lymphatic: Again demonstrated is extensive partially necrotic retroperitoneal lymphadenopathy. Most of these are unchanged to slightly worse. Representative nodes include a left periaortic nodal mass measuring 3.6 x 2.3 cm on image 78 (previously 3.1 x 2.0 cm), and a  large right pelvic side wall nodal mass measuring 5.5 x 3.3 cm on image 105 (previously 5.6 x 3.5 cm). The large necrotic mass involving the left sciatic notch measures 6.3 x 5.1 cm on image 106 (previously 5.8 x 4.4 cm). This mass invades the sacrum with associated sacral epidural tumor. Right inguinal adenopathy appears unchanged. Diffuse aortoiliac atherosclerosis again noted. No large vessel occlusion identified.  Reproductive: Unremarkable.  Other: No evidence of abdominal wall mass or hernia.  Musculoskeletal: Extensive osseous metastatic disease again noted. There may be a small amount of epidural tumor on the left at T10-11. No pathologic fracture identified in the spine. As above, the mass within the left sciatic notch invades and  partially destroys the sacrum. There is partial destruction of the right iliac bone and posterior acetabulum. The soft tissue component associated with the posterior right acetabular lesion has slightly improved.  IMPRESSION: 1. Slow progression of bulky partial necrotic retroperitoneal and pelvic lymphadenopathy consistent with worsening metastatic disease. 2. Diffuse osseous metastatic disease. 3. No evidence of solid organ parenchymal metastases. No definite pulmonary metastases. 4. Stable right lateral supraclavicular lymph node.   Electronically Signed   By: Richardean Sale M.D.   On: 10/10/2014 16:19   Dg Abd Acute W/chest  10/13/2014   CLINICAL DATA:  Nausea and emesis. Supraglottic laryngeal tumor with widespread metastases.  EXAM: DG ABDOMEN ACUTE W/ 1V CHEST  COMPARISON:  CT of the chest, abdomen, and pelvis 10/10/2014  FINDINGS: Heart size is normal. A left subclavian Port-A-Cath is stable in position. Emphysematous changes are noted. The lungs are otherwise clear.  Supine and upright views the abdomen demonstrate widespread extension of both large and small bowel loops. There is no free air. Minimal gas is present in the distal transverse or descending colon. Degenerative changes of the axial skeleton are again noted. Sclerotic changes in the pelvis are compatible with known metastases.  IMPRESSION: 1. Developing bowel obstruction. The transition point appears to be within the colon. 2. No free air. 3. Emphysema. 4. Osseous metastases.   Electronically Signed   By: San Morelle M.D.   On: 10/10/2014 07:20   Dg Abd Portable 1v  11/03/2014   CLINICAL DATA:  NG tube placement.  EXAM: PORTABLE ABDOMEN - 1 VIEW  COMPARISON:  11/04/2014  FINDINGS: Enteric tube not visualized. Continued evidence of multiple air-filled dilated small bowel loops measuring up to 4.7 cm in diameter without significant change. No free peritoneal air. Remainder of the exam is unchanged.  IMPRESSION: Persistent dilated  air-filled small bowel loops likely representing small bowel obstruction. No evidence of enteric tube.   Electronically Signed   By: Marin Olp M.D.   On: 11/03/2014 16:12    Microbiology: Recent Results (from the past 240 hour(s))  MRSA PCR Screening     Status: Abnormal   Collection Time: 10/14/2014  1:54 PM  Result Value Ref Range Status   MRSA by PCR POSITIVE (A) NEGATIVE Final     Labs: Basic Metabolic Panel: No results for input(s): NA, K, CL, CO2, GLUCOSE, BUN, CREATININE, CALCIUM, MG, PHOS in the last 168 hours. Liver Function Tests: No results for input(s): AST, ALT, ALKPHOS, BILITOT, PROT, ALBUMIN in the last 168 hours. No results for input(s): LIPASE, AMYLASE in the last 168 hours. No results for input(s): AMMONIA in the last 168 hours. CBC: No results for input(s): WBC, NEUTROABS, HGB, HCT, MCV, PLT in the last 168 hours. Cardiac Enzymes: No results for input(s): CKTOTAL, CKMB, CKMBINDEX, TROPONINI in the  last 168 hours. BNP: BNP (last 3 results) No results for input(s): BNP in the last 8760 hours.  ProBNP (last 3 results) No results for input(s): PROBNP in the last 8760 hours.  CBG:  Recent Labs Lab 10/13/2014 2106  GLUCAP 130*

## 2014-11-04 NOTE — Progress Notes (Signed)
DNR.  Related admission.  Patient scheduled for discharge home today.  Spoke with daughters at the bedside.  Advised that [facility] will do a follow up visit within 24 hours but they need [place] after d/c please call [phone].  Spoke with Sonia Baller, RN for patient.  Please contact [facility] for any needs or concerns.  Vance Gather, RN [facility]

## 2014-11-04 NOTE — Care Management Note (Signed)
Case Management Note  Patient Details  Name: NIVIN BRANIFF MRN: [id number] Date of Birth: Sep 25, 1945  Subjective/Objective:        advanced end stage esophageal carcinoma            Action/Plan:   Expected Discharge Date:   11/04/2014             Expected Discharge Plan:  Home w Hospice Care  In-House Referral:     Discharge planning Services  CM Consult  Post Acute Care Choice:  Resumption of Svcs/PTA Provider, Hospice Choice offered to:      [facility] Arranged:  RN [facility] Agency:  [facility] [place]  Status of Service:  Completed, signed off  Medicare Important Message Given:  No Date Medicare IM Given:    Medicare IM give by:    Date Additional Medicare IM Given:    Additional Medicare Important Message give by:     If discussed at [place] of Stay Meetings, dates discussed:    Additional Comments: Contacted [facility] to make aware of dc home today. Planned RN visit Nov 10, 2014.  Erenest Rasher, RN 11/04/2014, 4:04 PM

## 2014-11-04 NOTE — Clinical Social Work Note (Signed)
CSW received a call from RN requested transport home for pt  CSW prepared packet and provided to RN who stated she would call transport when MD signs DNR form  .Dede Query, LCSW [facility] Clinical Social Worker - Weekend Coverage cell #: [phone]

## 2014-11-07 ENCOUNTER — Telehealth: Payer: Self-pay | Admitting: *Deleted

## 2014-11-07 NOTE — Telephone Encounter (Signed)
VM from RN at [facility] wanted to make sure Dr. Alvy Bimler is aware that Cody [name] passed away in hospital on Nov 30, 2014.   RN, Mariel Kansky, states Hospice was not notified of Cody [name]'s death and found out this morning.

## 2014-11-08 NOTE — Progress Notes (Signed)
Call ed to resident's room at approximately 0310 by daughter. Patient was not breathing. Patient was pronounced dead at 58. Death verified with RN Jonette Eva. Daughter and son were at bedside.Marland Kitchen

## 2014-11-08 DEATH — deceased

## 2014-11-10 ENCOUNTER — Ambulatory Visit: Payer: Self-pay | Admitting: Hematology and Oncology

## 2014-11-21 ENCOUNTER — Other Ambulatory Visit: Payer: Self-pay | Admitting: Hematology and Oncology

## 2015-03-05 ENCOUNTER — Encounter (HOSPITAL_COMMUNITY): Payer: Self-pay | Admitting: Dentistry
